# Patient Record
Sex: Female | Born: 1990
Health system: Southern US, Community
[De-identification: ages and names within clinical notes are randomized; demographics above are authoritative.]

## PROBLEM LIST (undated history)

## (undated) ENCOUNTER — Inpatient Hospital Stay (HOSPITAL_COMMUNITY): Payer: Self-pay

## (undated) ENCOUNTER — Emergency Department (HOSPITAL_COMMUNITY): Payer: BC Managed Care – PPO

## (undated) DIAGNOSIS — A64 Unspecified sexually transmitted disease: Secondary | ICD-10-CM

## (undated) DIAGNOSIS — I10 Essential (primary) hypertension: Secondary | ICD-10-CM

## (undated) HISTORY — DX: Unspecified sexually transmitted disease: A64

## (undated) HISTORY — DX: Essential (primary) hypertension: I10

## (undated) HISTORY — PX: INDUCED ABORTION: SHX677

## (undated) HISTORY — PX: LAPAROSCOPIC TUBAL LIGATION: SUR803

---

## 2011-02-16 ENCOUNTER — Inpatient Hospital Stay (HOSPITAL_COMMUNITY)
Admission: AD | Admit: 2011-02-16 | Discharge: 2011-02-21 | DRG: 775 | Disposition: A | Discharge status: Home / Self Care | Payer: Medicaid Other | Source: Ambulatory Visit | Attending: Obstetrics and Gynecology | Admitting: Obstetrics and Gynecology

## 2011-02-16 ENCOUNTER — Inpatient Hospital Stay (HOSPITAL_COMMUNITY): Payer: Medicaid Other

## 2011-02-16 DIAGNOSIS — B9689 Other specified bacterial agents as the cause of diseases classified elsewhere: Secondary | ICD-10-CM | POA: Diagnosis present

## 2011-02-16 DIAGNOSIS — O9903 Anemia complicating the puerperium: Secondary | ICD-10-CM | POA: Diagnosis not present

## 2011-02-16 DIAGNOSIS — A499 Bacterial infection, unspecified: Secondary | ICD-10-CM | POA: Diagnosis present

## 2011-02-16 DIAGNOSIS — N760 Acute vaginitis: Secondary | ICD-10-CM | POA: Diagnosis present

## 2011-02-16 DIAGNOSIS — D649 Anemia, unspecified: Secondary | ICD-10-CM | POA: Diagnosis not present

## 2011-02-16 DIAGNOSIS — Z22330 Carrier of Group B streptococcus: Secondary | ICD-10-CM

## 2011-02-16 DIAGNOSIS — O99892 Other specified diseases and conditions complicating childbirth: Principal | ICD-10-CM | POA: Diagnosis present

## 2011-02-16 DIAGNOSIS — O2390 Unspecified genitourinary tract infection in pregnancy, unspecified trimester: Secondary | ICD-10-CM | POA: Diagnosis present

## 2011-02-16 LAB — CBC
MCH: 27.5 pg (ref 26.0–34.0)
MCHC: 32.1 g/dL (ref 30.0–36.0)
MCV: 85.7 fL (ref 78.0–100.0)
Platelets: 158 10*3/uL (ref 150–400)
RDW: 13.4 % (ref 11.5–15.5)
WBC: 12.7 10*3/uL — ABNORMAL HIGH (ref 4.0–10.5)

## 2011-02-16 LAB — URINALYSIS, ROUTINE W REFLEX MICROSCOPIC
Bilirubin Urine: NEGATIVE
Ketones, ur: 15 mg/dL — AB
Nitrite: NEGATIVE
Protein, ur: NEGATIVE mg/dL
Urobilinogen, UA: 0.2 mg/dL (ref 0.0–1.0)

## 2011-02-16 LAB — COMPREHENSIVE METABOLIC PANEL
AST: 25 U/L (ref 0–37)
Alkaline Phosphatase: 58 U/L (ref 39–117)
BUN: 6 mg/dL (ref 6–23)
CO2: 22 mEq/L (ref 19–32)
Chloride: 106 mEq/L (ref 96–112)
Creatinine, Ser: 0.71 mg/dL (ref 0.4–1.2)
GFR calc non Af Amer: >60 mL/min (ref >60)
Potassium: 3.6 mEq/L (ref 3.5–5.1)
Total Bilirubin: 0.3 mg/dL (ref 0.3–1.2)

## 2011-02-16 LAB — WET PREP, GENITAL
Trich, Wet Prep: NONE SEEN
Yeast Wet Prep HPF POC: NONE SEEN

## 2011-02-16 LAB — DIFFERENTIAL
Eosinophils Relative: 0 % (ref 0–5)
Lymphocytes Relative: 16 % (ref 12–46)
Lymphs Abs: 2 10*3/uL (ref 0.7–4.0)
Monocytes Absolute: 1.1 10*3/uL — ABNORMAL HIGH (ref 0.1–1.0)

## 2011-02-17 LAB — MAGNESIUM: Magnesium: 5.2 mg/dL — ABNORMAL HIGH (ref 1.5–2.5)

## 2011-02-17 LAB — DIFFERENTIAL
Basophils Absolute: 0 10*3/uL (ref 0.0–0.1)
Eosinophils Relative: 0 % (ref 0–5)
Lymphocytes Relative: 6 % — ABNORMAL LOW (ref 12–46)
Lymphs Abs: 0.9 10*3/uL (ref 0.7–4.0)
Neutro Abs: 12.5 10*3/uL — ABNORMAL HIGH (ref 1.7–7.7)

## 2011-02-17 LAB — CBC
HCT: 31.4 % — ABNORMAL LOW (ref 36.0–46.0)
Hemoglobin: 10.1 g/dL — ABNORMAL LOW (ref 12.0–15.0)
MCV: 85.3 fL (ref 78.0–100.0)
RBC: 3.68 MIL/uL — ABNORMAL LOW (ref 3.87–5.11)
WBC: 13.9 10*3/uL — ABNORMAL HIGH (ref 4.0–10.5)

## 2011-02-17 LAB — COMPREHENSIVE METABOLIC PANEL
ALT: 38 U/L — ABNORMAL HIGH (ref 0–35)
Calcium: 7.4 mg/dL — ABNORMAL LOW (ref 8.4–10.5)
Creatinine, Ser: 0.62 mg/dL (ref 0.4–1.2)
GFR calc Af Amer: >60 mL/min (ref >60)
GFR calc non Af Amer: >60 mL/min (ref >60)
Glucose, Bld: 105 mg/dL — ABNORMAL HIGH (ref 70–99)
Sodium: 134 mEq/L — ABNORMAL LOW (ref 135–145)
Total Protein: 6.6 g/dL (ref 6.0–8.3)

## 2011-02-17 LAB — GC/CHLAMYDIA PROBE AMP, GENITAL: Chlamydia, DNA Probe: NEGATIVE

## 2011-02-18 LAB — STREP B DNA PROBE: Strep Group B Ag: POSITIVE

## 2011-02-18 LAB — MAGNESIUM: Magnesium: 5.5 mg/dL — ABNORMAL HIGH (ref 1.5–2.5)

## 2011-02-19 ENCOUNTER — Other Ambulatory Visit: Payer: Self-pay

## 2011-02-19 LAB — CBC
MCV: 86.8 fL (ref 78.0–100.0)
Platelets: 188 10*3/uL (ref 150–400)
RBC: 3.87 MIL/uL (ref 3.87–5.11)
RDW: 13.8 % (ref 11.5–15.5)
WBC: 16.2 10*3/uL — ABNORMAL HIGH (ref 4.0–10.5)

## 2011-02-19 LAB — HEPATIC FUNCTION PANEL
AST: 17 U/L (ref 0–37)
Bilirubin, Direct: 0.1 mg/dL (ref 0.0–0.3)
Indirect Bilirubin: 0 mg/dL — ABNORMAL LOW (ref 0.3–0.9)
Total Bilirubin: 0.1 mg/dL — ABNORMAL LOW (ref 0.3–1.2)

## 2011-02-19 LAB — RPR: RPR Ser Ql: NONREACTIVE

## 2011-02-20 LAB — CBC
Hemoglobin: 10.2 g/dL — ABNORMAL LOW (ref 12.0–15.0)
MCH: 27.7 pg (ref 26.0–34.0)
MCHC: 32.1 g/dL (ref 30.0–36.0)
Platelets: 184 10*3/uL (ref 150–400)
RBC: 3.68 MIL/uL — ABNORMAL LOW (ref 3.87–5.11)

## 2012-04-16 ENCOUNTER — Encounter: Payer: Self-pay | Admitting: Obstetrics and Gynecology

## 2012-04-16 ENCOUNTER — Ambulatory Visit (INDEPENDENT_AMBULATORY_CARE_PROVIDER_SITE_OTHER): Payer: Self-pay | Admitting: Obstetrics and Gynecology

## 2012-04-16 VITALS — BP 114/62 | Ht 64.0 in | Wt 232.0 lb

## 2012-04-16 DIAGNOSIS — L732 Hidradenitis suppurativa: Secondary | ICD-10-CM

## 2012-04-16 DIAGNOSIS — Z124 Encounter for screening for malignant neoplasm of cervix: Secondary | ICD-10-CM

## 2012-04-16 NOTE — Progress Notes (Signed)
Last Pap: 2012 WNL: Yes Regular Periods:no Contraception: Implanon 5/12   Monthly Breast exam:yes Tetanus<27yrs:yes Nl.Bladder Function:yes Daily BMs:yes Healthy Diet:yes Calcium:yes Mammogram:no Exercise:yes Seatbelt: yes Abuse at home: no Stressful work:yes Sigmoid-colonoscopy: n/a Bone Density: No Pt sates she has boil about twice a week undet axilla and on vulva.  She does use a razor to shave.  She has had no fever or chills. This has been going on for about one year Physical Examination: General appearance - alert, well appearing, and in no distress Mental status - normal mood, behavior, speech, dress, motor activity, and thought processes Neck - supple, no significant adenopathy, thyroid exam: thyroid is normal in size without nodules or tenderness Chest - clear to auscultation, no wheezes, rales or rhonchi, symmetric air entry Heart - normal rate and regular rhythm Abdomen - soft, nontender, nondistended, no masses or organomegaly Breasts - breasts appear normal, no suspicious masses, no skin or nipple changes or axillary nodes Pelvic - normal external genitalia, vulva, vagina, cervix, uterus and adnexa Rectal - normal rectal, no masses, rectal exam not indicated Back exam - full range of motion, no tenderness, palpable spasm or pain on motion Neurological - alert, oriented, normal speech, no focal findings or movement disorder noted Musculoskeletal - no joint tenderness, deformity or swelling Extremities - no edema, redness or tenderness in the calves or thighs Skin - normal coloration and turgor, no rashes, no suspicious skin lesions noted Axilla.  No abscesses noted Routine exam Hydadenitis  Pap sent no Mammogram due no *Nexplanon RT in one year.  Pt declined STD testing and derm evaluation.  Recommend warm compresses and stop shaving.

## 2012-10-18 ENCOUNTER — Encounter: Payer: Self-pay | Admitting: Obstetrics and Gynecology

## 2012-11-05 ENCOUNTER — Encounter: Payer: Self-pay | Admitting: Obstetrics and Gynecology

## 2012-11-05 ENCOUNTER — Ambulatory Visit (INDEPENDENT_AMBULATORY_CARE_PROVIDER_SITE_OTHER): Payer: Self-pay | Admitting: Obstetrics and Gynecology

## 2012-11-05 VITALS — BP 116/80 | Wt 235.0 lb

## 2012-11-05 DIAGNOSIS — Z3046 Encounter for surveillance of implantable subdermal contraceptive: Secondary | ICD-10-CM

## 2012-11-05 DIAGNOSIS — Z309 Encounter for contraceptive management, unspecified: Secondary | ICD-10-CM

## 2012-11-05 DIAGNOSIS — Z3009 Encounter for other general counseling and advice on contraception: Secondary | ICD-10-CM

## 2012-11-05 MED ORDER — NORETHIN-ETH ESTRAD-FE BIPHAS 1 MG-10 MCG / 10 MCG PO TABS: 1.0000 | ORAL_TABLET | Freq: Every day | ORAL | Status: DC

## 2012-11-05 NOTE — Progress Notes (Signed)
Implanon removal  left arm prepped and draped  1cc lidocaine used for ansthesia Small incision made with the scalpel nexplanon removed without difficulty with hemostat Incision closed with ster strips and pressure applied with 4x4 Pt tolerated the procedure well  Pt given info on sll BC She desires OCP No contraindictions Pt given lo/lo estrin sample with verbal and written instrucitons RT JUne for AEX

## 2012-11-05 NOTE — Patient Instructions (Signed)
Oral Contraception Use Oral contraceptives (OCs) are medicines taken to prevent pregnancy. OCs work by preventing the ovaries from releasing eggs. The hormones in OCs also cause the cervical mucus to thicken, preventing the sperm from entering the uterus. The hormones also cause the uterine lining to become thin, not allowing a fertilized egg to attach to the inside of the uterus. OCs are highly effective when taken exactly as prescribed. However, OCs do not prevent sexually transmitted diseases (STDs). Safe sex practices, such as using condoms along with an OC, can help prevent STDs.  Before taking OCs, you may have a physical exam and Pap test. Your caregiver may also order blood tests if necessary. Your caregiver will make sure you are a good candidate for oral contraception. Discuss with your caregiver the possible side effects of the OC you may be prescribed. When starting an OC, it can take 2 to 3 months for the body to adjust to the changes in hormone levels in your body.  HOW TO TAKE ORAL CONTRACEPTIVES Your caregiver may advise you on how to start taking the first cycle of OCs. Otherwise, you can:  Start on day 1 of your menstrual period. You will not need any backup contraceptive protection with this start time.  Start on the first Sunday after your menstrual period or the day you get your prescription. In these cases, you will need to use backup contraceptive protection for the first 7-day cycle. After you have started taking OCs:  If you forget to take 1 pill, take it as soon as you remember. Take the next pill at the regular time.  If you miss 2 or more pills, use backup birth control until your next menstrual period starts.  If you use a 28-day pack that contains inactive pills and you miss 1 of the last 7 pills (pills with no hormones), it will not matter. Throw away the rest of the non-hormone pills and start a new pill pack. No matter which day you start the OC, you will always start  a new pack on that same day of the week. Have an extra pack of OCs and a backup contraceptive method available in case you miss some pills or lose your OC pack. HOME CARE INSTRUCTIONS   Do not smoke.  Always use a condom to protect against STDs. OCs do not protect against STDs.  Use a calendar to mark your menstrual period days.  Read the information and directions that come with your OC. Talk to your caregiver if you have questions. SEEK MEDICAL CARE IF:   You develop nausea and vomiting.  You have abnormal vaginal discharge or bleeding.  You develop a rash.  You miss your menstrual period.  You are losing your hair.  You need treatment for mood swings or depression.  You get dizzy when taking the OC.  You develop acne from taking the OC.  You become pregnant. SEEK IMMEDIATE MEDICAL CARE IF:   You develop chest pain.  You develop shortness of breath.  You have an uncontrolled or severe headache.  You develop numbness or slurred speech.  You develop visual problems.  You develop pain, redness, and swelling in the legs. Document Released: 11/03/2011 Document Revised: 02/06/2012 Document Reviewed: 11/03/2011 [employer] Patient Information 2013 [employer].  

## 2013-08-05 ENCOUNTER — Emergency Department (INDEPENDENT_AMBULATORY_CARE_PROVIDER_SITE_OTHER)
Admission: EM | Admit: 2013-08-05 | Discharge: 2013-08-05 | Disposition: A | Discharge status: Home / Self Care | Payer: Self-pay | Source: Home / Self Care | Attending: Family Medicine | Admitting: Family Medicine

## 2013-08-05 ENCOUNTER — Encounter (HOSPITAL_COMMUNITY): Payer: Self-pay | Admitting: Emergency Medicine

## 2013-08-05 DIAGNOSIS — L0291 Cutaneous abscess, unspecified: Secondary | ICD-10-CM

## 2013-08-05 NOTE — ED Notes (Signed)
Question from lab

## 2013-08-05 NOTE — ED Notes (Signed)
C/o abscess on buttock x 5 days. Denies drainage and fever. States gradually getting worse and tender to touch.  Pt has used advil for pain relief.

## 2013-08-05 NOTE — ED Provider Notes (Signed)
Theresa Horne is a 22 y.o. female who presents to Urgent Care today for left buttock abscess present for 5 days. The abscess is worsening and is quite painful. She denies any fevers or chills nausea vomiting diarrhea. These are consistent with prior abscesses. She's never had one of the buttocks before. She feels well otherwise. She has not tried any medications.   Past Medical History  Diagnosis Date  . Hypertension    History  Substance Use Topics  . Smoking status: Never Smoker   . Smokeless tobacco: Never Used  . Alcohol Use: No   ROS as above Medications reviewed. No current facility-administered medications for this encounter.   Current Outpatient Prescriptions  Medication Sig Dispense Refill  . Norethindrone-Ethinyl Estradiol-Fe Biphas (LO LOESTRIN FE) 1 MG-10 MCG / 10 MCG tablet Take 1 tablet by mouth daily.  1 Package  6    Exam:  BP 141/86  Pulse 78  Temp(Src) 98.1 F (36.7 C) (Oral)  Resp 18  SpO2 100%  LMP 08/05/2013 Gen: Well NAD SKIN: 4 cm area of induration with small area of fluctuance on the left buttocks. No pus is expressed. Tender to palpation.   Abscess incision and drainage:  Consent obtained and timeout performed. Skin overlying the area of fluctuance cleaned with alcohol and 3 mL of 2% lidocaine with epinephrine were used to achieve anesthesia. The skin was again re\re cleaned with alcohol and a sharp scalpel was used to cut down to the level of the abscess. A large amount of pus was expressed and was cultured. Further pus was expressed and a wooden Q-tip was used to breakup loculations.  The wound was packed with 5 inches of quarter inch packing material.  A dressing was applied.   Assessment and Plan: 22 y.o. female with abscess incision and drainage.  Culture pending. Remove packing material in 3 days.  Change dressing daily.  Come back as needed.  Discussed warning signs or symptoms. Please see discharge instructions. Patient expresses  understanding.       Rodolph Bong, MD 08/05/13 1340

## 2013-08-08 ENCOUNTER — Telehealth (HOSPITAL_COMMUNITY): Payer: Self-pay | Admitting: Family Medicine

## 2013-08-08 LAB — WOUND CULTURE

## 2013-08-08 MED ORDER — CEPHALEXIN 500 MG PO CAPS: 500.0000 mg | ORAL_CAPSULE | Freq: Four times a day (QID) | ORAL | Status: DC

## 2013-08-08 NOTE — ED Notes (Signed)
Called patient with wound culture results.  She continues to have some pain.  Called in Keflex.  Patient is growing Group B Strep.   Rodolph Bong, MD 08/08/13 0800

## 2013-10-12 ENCOUNTER — Encounter (HOSPITAL_COMMUNITY): Payer: Self-pay | Admitting: Emergency Medicine

## 2013-10-12 ENCOUNTER — Other Ambulatory Visit (HOSPITAL_COMMUNITY)
Admission: RE | Admit: 2013-10-12 | Discharge: 2013-10-12 | Disposition: A | Discharge status: Home / Self Care | Payer: BC Managed Care – PPO | Source: Ambulatory Visit | Attending: Emergency Medicine | Admitting: Emergency Medicine

## 2013-10-12 ENCOUNTER — Emergency Department (HOSPITAL_COMMUNITY)
Admission: EM | Admit: 2013-10-12 | Discharge: 2013-10-12 | Disposition: A | Discharge status: Home / Self Care | Payer: Self-pay | Source: Home / Self Care | Attending: Emergency Medicine | Admitting: Emergency Medicine

## 2013-10-12 DIAGNOSIS — N760 Acute vaginitis: Secondary | ICD-10-CM | POA: Insufficient documentation

## 2013-10-12 DIAGNOSIS — Z113 Encounter for screening for infections with a predominantly sexual mode of transmission: Secondary | ICD-10-CM | POA: Insufficient documentation

## 2013-10-12 MED ORDER — FLUCONAZOLE 100 MG PO TABS: ORAL_TABLET | ORAL | Status: DC

## 2013-10-12 NOTE — ED Notes (Signed)
C/o vaginal itching and irritation since Wednesday

## 2013-10-12 NOTE — ED Provider Notes (Signed)
Medical screening examination/treatment/procedure(s) were performed by a resident physician and as supervising physician I was immediately available for consultation/collaboration.  Leslee Home, M.D.  Reuben Likes, MD 10/12/13 [phone]

## 2013-10-12 NOTE — ED Provider Notes (Signed)
CSN: [id number]     Arrival date & time 10/12/13  1610 History   First MD Initiated Contact with Patient 10/12/13 1721     Chief Complaint  Patient presents with  . Vaginal Itching   (Consider location/radiation/quality/duration/timing/severity/associated sxs/prior Treatment) HPI Pt is a 22 yo F presenting with vaginal itching, white discharge without odor and external burning. Tried Monistat, which did not give her any relief. States she has a new partner, does not think she could have a sexually transmitted disease. LMP 2.5 weeks ago. No external bumps/blisters, no abnormal bleeding. No dysuria, no constipation. She states she had similar discharge about 5 months ago that resolved with OTC monistat.   Past Medical History  Diagnosis Date  . Hypertension    History reviewed. No pertinent past surgical history. Family History  Problem Relation Age of Onset  . Hypertension Father   . Diabetes Paternal Uncle   . Renal Disease Maternal Grandmother    History  Substance Use Topics  . Smoking status: Never Smoker   . Smokeless tobacco: Never Used  . Alcohol Use: No   OB History   Grav Para Term Preterm Abortions TAB SAB Ect Mult Living   2 1 1  1     1      Review of Systems  Constitutional: Negative for fever and chills.  HENT: Negative for congestion.   Eyes: Negative for visual disturbance.  Respiratory: Negative for cough and shortness of breath.   Cardiovascular: Negative for chest pain and leg swelling.  Gastrointestinal: Negative for abdominal pain.  Genitourinary: Positive for vaginal discharge. Negative for dysuria, difficulty urinating, genital sores, menstrual problem, pelvic pain and dyspareunia.  Musculoskeletal: Negative for arthralgias and myalgias.  Skin: Negative for rash.  Neurological: Negative for headaches.    Allergies  Review of patient's allergies indicates no known allergies.  Home Medications   Current Outpatient Rx  Name  Route  Sig  Dispense   Refill  . Norethindrone-Ethinyl Estradiol-Fe Biphas (LO LOESTRIN FE) 1 MG-10 MCG / 10 MCG tablet   Oral   Take 1 tablet by mouth daily.   1 Package   6   . cephALEXin (KEFLEX) 500 MG capsule   Oral   Take 1 capsule (500 mg total) by mouth 4 (four) times daily.   40 capsule   0   . fluconazole (DIFLUCAN) 100 MG tablet      Take one tab today. If your symptoms do not resolve, take second pill in 72 hours.   2 tablet   0    BP 140/79  Pulse 84  Temp(Src) 98 F (36.7 C) (Oral)  Resp 16  SpO2 100%  LMP 09/26/2013 Physical Exam  Constitutional: She is oriented to person, place, and time. She appears well-developed and well-nourished. No distress.  HENT:  Head: Normocephalic and atraumatic.  Neck: Normal range of motion. Neck supple.  Cardiovascular: Normal rate, regular rhythm and normal heart sounds.   No murmur heard. Pulmonary/Chest: Effort normal and breath sounds normal. She has no wheezes.  Abdominal: Soft. She exhibits no distension. There is no tenderness. There is no rebound and no guarding.  Genitourinary: Uterus normal. Vaginal discharge (White discharge noted around cervix, no odor appreciated.) found.  No CMT. No Adnexal tenderness or fullness.  Musculoskeletal: Normal range of motion. She exhibits no edema and no tenderness.  Neurological: She is alert and oriented to person, place, and time.  Skin: Skin is warm and dry.  Psychiatric: She has a normal mood  and affect.    ED Course  Procedures (including critical care time) Labs Review Labs Reviewed  CERVICOVAGINAL ANCILLARY ONLY   Imaging Review No results found.   MDM   1. Vaginitis    Discharge most consistent with yeast infection. Will treat with Diflucan once, with repeat treatment in 72 hours if not improved. Will send affirm and GC/Ch and contact if anything results other than yeast. Patient agrees with plan.    Hilarie Fredrickson, MD 10/12/13 [phone]

## 2013-10-14 LAB — POCT PREGNANCY, URINE: Preg Test, Ur: NEGATIVE

## 2014-04-10 ENCOUNTER — Other Ambulatory Visit (HOSPITAL_COMMUNITY)
Admission: RE | Admit: 2014-04-10 | Discharge: 2014-04-10 | Disposition: A | Discharge status: Home / Self Care | Payer: BC Managed Care – PPO | Source: Ambulatory Visit | Attending: Family Medicine | Admitting: Family Medicine

## 2014-04-10 ENCOUNTER — Encounter (HOSPITAL_COMMUNITY): Payer: Self-pay | Admitting: Emergency Medicine

## 2014-04-10 ENCOUNTER — Emergency Department (HOSPITAL_COMMUNITY)
Admission: EM | Admit: 2014-04-10 | Discharge: 2014-04-10 | Disposition: A | Discharge status: Home / Self Care | Payer: BC Managed Care – PPO | Source: Home / Self Care | Attending: Family Medicine | Admitting: Family Medicine

## 2014-04-10 DIAGNOSIS — N760 Acute vaginitis: Secondary | ICD-10-CM | POA: Insufficient documentation

## 2014-04-10 DIAGNOSIS — IMO0001 Reserved for inherently not codable concepts without codable children: Secondary | ICD-10-CM

## 2014-04-10 DIAGNOSIS — Z113 Encounter for screening for infections with a predominantly sexual mode of transmission: Secondary | ICD-10-CM | POA: Insufficient documentation

## 2014-04-10 DIAGNOSIS — R030 Elevated blood-pressure reading, without diagnosis of hypertension: Secondary | ICD-10-CM

## 2014-04-10 LAB — POCT URINALYSIS DIP (DEVICE)
Bilirubin Urine: NEGATIVE
Glucose, UA: NEGATIVE mg/dL
Ketones, ur: NEGATIVE mg/dL
NITRITE: NEGATIVE
Protein, ur: 100 mg/dL — AB
Specific Gravity, Urine: >=1.03 (ref 1.005–1.030)
UROBILINOGEN UA: 0.2 mg/dL (ref 0.0–1.0)
pH: 6.5 (ref 5.0–8.0)

## 2014-04-10 LAB — POCT PREGNANCY, URINE: PREG TEST UR: NEGATIVE

## 2014-04-10 MED ORDER — METRONIDAZOLE 500 MG PO TABS: 500.0000 mg | ORAL_TABLET | Freq: Two times a day (BID) | ORAL | Status: DC

## 2014-04-10 NOTE — Discharge Instructions (Signed)
Your urine studies were normal and your pregnancy test was negative. You blood pressure was quite elevated and this need to be addressed by the primary care provider of your choice as soon as possible.  Bacterial Vaginosis Bacterial vaginosis is a vaginal infection that occurs when the normal balance of bacteria in the vagina is disrupted. It results from an overgrowth of certain bacteria. This is the most common vaginal infection in women of childbearing age. Treatment is important to prevent complications, especially in pregnant women, as it can cause a premature delivery. CAUSES  Bacterial vaginosis is caused by an increase in harmful bacteria that are normally present in smaller amounts in the vagina. Several different kinds of bacteria can cause bacterial vaginosis. However, the reason that the condition develops is not fully understood. RISK FACTORS Certain activities or behaviors can put you at an increased risk of developing bacterial vaginosis, including:  Having a new sex partner or multiple sex partners.  Douching.  Using an intrauterine device (IUD) for contraception. Women do not get bacterial vaginosis from toilet seats, bedding, swimming pools, or contact with objects around them. SIGNS AND SYMPTOMS  Some women with bacterial vaginosis have no signs or symptoms. Common symptoms include:  Grey vaginal discharge.  A fishlike odor with discharge, especially after sexual intercourse.  Itching or burning of the vagina and vulva.  Burning or pain with urination. DIAGNOSIS  Your health care provider will take a medical history and examine the vagina for signs of bacterial vaginosis. A sample of vaginal fluid may be taken. Your health care provider will look at this sample under a microscope to check for bacteria and abnormal cells. A vaginal pH test may also be done.  TREATMENT  Bacterial vaginosis may be treated with antibiotic medicines. These may be given in the form of a pill  or a vaginal cream. A second round of antibiotics may be prescribed if the condition comes back after treatment.  HOME CARE INSTRUCTIONS   Only take over-the-counter or prescription medicines as directed by your health care provider.  If antibiotic medicine was prescribed, take it as directed. Make sure you finish it even if you start to feel better.  Do not have sex until treatment is completed.  Tell all sexual partners that you have a vaginal infection. They should see their health care provider and be treated if they have problems, such as a mild rash or itching.  Practice safe sex by using condoms and only having one sex partner. SEEK MEDICAL CARE IF:   Your symptoms are not improving after 3 days of treatment.  You have increased discharge or pain.  You have a fever. MAKE SURE YOU:   Understand these instructions.  Will watch your condition.  Will get help right away if you are not doing well or get worse. FOR MORE INFORMATION  Centers for Disease Control and Prevention, Division of STD Prevention: SolutionApps.co.zawww.cdc.gov/std American Sexual Health Association (ASHA): www.ashastd.org  Document Released: 11/14/2005 Document Revised: 09/04/2013 Document Reviewed: 06/26/2013 [facility] Patient Information 2014 [place], [place].  Vaginitis Vaginitis is an inflammation of the vagina. It is most often caused by a change in the normal balance of the bacteria and yeast that live in the vagina. This change in balance causes an overgrowth of certain bacteria or yeast, which causes the inflammation. There are different types of vaginitis, but the most common types are:  Bacterial vaginosis.  Yeast infection (candidiasis).  Trichomoniasis vaginitis. This is a sexually transmitted infection (STI).  Viral  vaginitis.  Atropic vaginitis.  Allergic vaginitis. CAUSES  The cause depends on the type of vaginitis. Vaginitis can be caused by:  Bacteria (bacterial vaginosis).  Yeast (yeast  infection).  A parasite (trichomoniasis vaginitis)  A virus (viral vaginitis).  Low hormone levels (atrophic vaginitis). Low hormone levels can occur during pregnancy, breastfeeding, or after menopause.  Irritants, such as bubble baths, scented tampons, and feminine sprays (allergic vaginitis). Other factors can change the normal balance of the yeast and bacteria that live in the vagina. These include:  Antibiotic medicines.  Poor hygiene.  Diaphragms, vaginal sponges, spermicides, birth control pills, and intrauterine devices (IUD).  Sexual intercourse.  Infection.  Uncontrolled diabetes.  A weakened immune system. SYMPTOMS  Symptoms can vary depending on the cause of the vaginitis. Common symptoms include:  Abnormal vaginal discharge.  The discharge is white, gray, or yellow with bacterial vaginosis.  The discharge is thick, white, and cheesy with a yeast infection.  The discharge is frothy and yellow or greenish with trichomoniasis.  A bad vaginal odor.  The odor is fishy with bacterial vaginosis.  Vaginal itching, pain, or swelling.  Painful intercourse.  Pain or burning when urinating. Sometimes, there are no symptoms. TREATMENT  Treatment will vary depending on the type of infection.   Bacterial vaginosis and trichomoniasis are often treated with antibiotic creams or pills.  Yeast infections are often treated with antifungal medicines, such as vaginal creams or suppositories.  Viral vaginitis has no cure, but symptoms can be treated with medicines that relieve discomfort. Your sexual partner should be treated as well.  Atrophic vaginitis may be treated with an estrogen cream, pill, suppository, or vaginal ring. If vaginal dryness occurs, lubricants and moisturizing creams may help. You may be told to avoid scented soaps, sprays, or douches.  Allergic vaginitis treatment involves quitting the use of the product that is causing the problem. Vaginal creams  can be used to treat the symptoms. HOME CARE INSTRUCTIONS   Take all medicines as directed by your caregiver.  Keep your genital area clean and dry. Avoid soap and only rinse the area with water.  Avoid douching. It can remove the healthy bacteria in the vagina.  Do not use tampons or have sexual intercourse until your vaginitis has been treated. Use sanitary pads while you have vaginitis.  Wipe from front to back. This avoids the spread of bacteria from the rectum to the vagina.  Let air reach your genital area.  Wear cotton underwear to decrease moisture buildup.  Avoid wearing underwear while you sleep until your vaginitis is gone.  Avoid tight pants and underwear or nylons without a cotton panel.  Take off wet clothing (especially bathing suits) as soon as possible.  Use mild, non-scented products. Avoid using irritants, such as:  Scented feminine sprays.  Fabric softeners.  Scented detergents.  Scented tampons.  Scented soaps or bubble baths.  Practice safe sex and use condoms. Condoms may prevent the spread of trichomoniasis and viral vaginitis. SEEK MEDICAL CARE IF:   You have abdominal pain.  You have a fever or persistent symptoms for more than 2 3 days.  You have a fever and your symptoms suddenly get worse. Document Released: 09/11/2007 Document Revised: 08/08/2012 Document Reviewed: 04/26/2012 [facility] Patient Information 2014 [place], [place].  Hypertension As your heart beats, it forces blood through your arteries. This force is your blood pressure. If the pressure is too high, it is called hypertension (HTN) or high blood pressure. HTN is dangerous because you  may have it and not know it. High blood pressure may mean that your heart has to work harder to pump blood. Your arteries may be narrow or stiff. The extra work puts you at risk for heart disease, stroke, and other problems.  Blood pressure consists of two numbers, a higher number over a lower,  110/72, for example. It is stated as "110 over 72." The ideal is below 120 for the top number (systolic) and under 80 for the bottom (diastolic). Write down your blood pressure today. You should pay close attention to your blood pressure if you have certain conditions such as:  Heart failure.  Prior heart attack.  Diabetes  Chronic kidney disease.  Prior stroke.  Multiple risk factors for heart disease. To see if you have HTN, your blood pressure should be measured while you are seated with your arm held at the level of the heart. It should be measured at least twice. A one-time elevated blood pressure reading (especially in the Emergency Department) does not mean that you need treatment. There may be conditions in which the blood pressure is different between your right and left arms. It is important to see your caregiver soon for a recheck. Most people have essential hypertension which means that there is not a specific cause. This type of high blood pressure may be lowered by changing lifestyle factors such as:  Stress.  Smoking.  Lack of exercise.  Excessive weight.  Drug/tobacco/alcohol use.  Eating less salt. Most people do not have symptoms from high blood pressure until it has caused damage to the body. Effective treatment can often prevent, delay or reduce that damage. TREATMENT  When a cause has been identified, treatment for high blood pressure is directed at the cause. There are a large number of medications to treat HTN. These fall into several categories, and your caregiver will help you select the medicines that are best for you. Medications may have side effects. You should review side effects with your caregiver. If your blood pressure stays high after you have made lifestyle changes or started on medicines,   Your medication(s) may need to be changed.  Other problems may need to be addressed.  Be certain you understand your prescriptions, and know how and when  to take your medicine.  Be sure to follow up with your caregiver within the time frame advised (usually within two weeks) to have your blood pressure rechecked and to review your medications.  If you are taking more than one medicine to lower your blood pressure, make sure you know how and at what times they should be taken. Taking two medicines at the same time can result in blood pressure that is too low. SEEK IMMEDIATE MEDICAL CARE IF:  You develop a severe headache, blurred or changing vision, or confusion.  You have unusual weakness or numbness, or a faint feeling.  You have severe chest or abdominal pain, vomiting, or breathing problems. MAKE SURE YOU:   Understand these instructions.  Will watch your condition.  Will get help right away if you are not doing well or get worse. Document Released: 11/14/2005 Document Revised: 02/06/2012 Document Reviewed: 07/04/2008 [facility] MexicoExitCare Patient Information 2014 [place]ExitCare, [place]LL[place].

## 2014-04-10 NOTE — ED Provider Notes (Signed)
Medical screening examination/treatment/procedure(s) were performed by resident physician or non-physician practitioner and as supervising physician I was immediately available for consultation/collaboration.   Dnya Hickle DOUGLAS MD.   Chaye Misch D Elmon Shader, MD 04/10/14 1812 

## 2014-04-10 NOTE — ED Notes (Signed)
pT  REPORTS  SYMPTOMS  OF  WHITE  VAGINAL  DISCHARGE  WITH  SOME  ITCHING    /  IRRITATION      FOR  SEVERAL  DAYS

## 2014-04-10 NOTE — ED Provider Notes (Signed)
CSN: [id number]     Arrival date & time 04/10/14  1601 History   First MD Initiated Contact with Patient 04/10/14 1640     Chief Complaint  Patient presents with  . Vaginal Discharge   (Consider location/radiation/quality/duration/timing/severity/associated sxs/prior Treatment) HPI Comments: No urinary sx, no vaginal bleeding.  [facility]: [facility] PCP: none LNMP: 3rd week in April 2015.   Patient is a 23 y.o. female presenting with vaginal itching. The history is provided by the patient.  Vaginal Itching This is a new problem. The current episode started 2 days ago. The problem occurs constantly. The problem has not changed since onset.Nothing aggravates the symptoms. Nothing relieves the symptoms. She has tried nothing for the symptoms.    Past Medical History  Diagnosis Date  . Hypertension    History reviewed. No pertinent past surgical history. Family History  Problem Relation Age of Onset  . Hypertension Father   . Diabetes Paternal Uncle   . Renal Disease Maternal Grandmother    History  Substance Use Topics  . Smoking status: Never Smoker   . Smokeless tobacco: Never Used  . Alcohol Use: No   OB History   Grav Para Term Preterm Abortions TAB SAB Ect Mult Living   2 1 1  1     1      Review of Systems  All other systems reviewed and are negative.   Allergies  Review of patient's allergies indicates no known allergies.  Home Medications   Prior to Admission medications   Medication Sig Start Date End Date Taking? Authorizing Provider  cephALEXin (KEFLEX) 500 MG capsule Take 1 capsule (500 mg total) by mouth 4 (four) times daily. 08/08/13   Rodolph BongEvan S Corey, MD  fluconazole (DIFLUCAN) 100 MG tablet Take one tab today. If your symptoms do not resolve, take second pill in 72 hours. 10/12/13   Amber Nydia BoutonM Hairford, MD  Norethindrone-Ethinyl Estradiol-Fe Biphas (LO LOESTRIN FE) 1 MG-10 MCG / 10 MCG tablet Take 1 tablet by mouth daily. 11/05/12   Naima A Dillard, MD    BP 152/102  Pulse 83  Temp(Src) 98.1 F (36.7 C) (Oral)  Resp 18  SpO2 100%  LMP 03/12/2014 Physical Exam  Nursing note and vitals reviewed. Constitutional: She is oriented to person, place, and time. She appears well-developed and well-nourished. No distress.  HENT:  Head: Normocephalic and atraumatic.  Eyes: Conjunctivae are normal. No scleral icterus.  Cardiovascular: Normal rate.   Pulmonary/Chest: Effort normal.  Abdominal: Soft. Bowel sounds are normal. She exhibits no distension and no mass. There is no tenderness.  Genitourinary: Uterus normal. Pelvic exam was performed with patient supine. There is no rash, tenderness, lesion or injury on the right labia. There is no rash, tenderness, lesion or injury on the left labia. Cervix exhibits discharge. Cervix exhibits no motion tenderness and no friability. Right adnexum displays no mass, no tenderness and no fullness. Left adnexum displays no mass, no tenderness and no fullness. No erythema or tenderness around the vagina. No foreign body around the vagina. No signs of injury around the vagina. Vaginal discharge found.  Scant amount of dark mucous in vaginal vault  Musculoskeletal: Normal range of motion.  Neurological: She is alert and oriented to person, place, and time.  Skin: Skin is warm and dry. No rash noted. No erythema.  Psychiatric: She has a normal mood and affect. Her behavior is normal.    ED Course  Procedures (including critical care time) Labs Review Labs Reviewed  POCT URINALYSIS DIP (DEVICE) - Abnormal; Notable for the following:    Hgb urine dipstick LARGE (*)    Protein, ur 100 (*)    Leukocytes, UA SMALL (*)    All other components within normal limits  POCT PREGNANCY, URINE  CERVICOVAGINAL ANCILLARY ONLY    Imaging Review No results found.   MDM   1. Vaginitis   2. Elevated blood pressure    urine studies were normal and pregnancy test was negative. blood pressure was quite elevated and this  need to be addressed by the primary care provider of your choice as soon as possible. Will treat for BV with metronidazole and additional treatement to be based upon lab results. GYN follow up if no improvement. PCP follow up for elevated BP   Jess BartersJennifer Lee Campo BonitoPresson, GeorgiaPA 04/10/14 1755

## 2014-05-13 ENCOUNTER — Emergency Department (INDEPENDENT_AMBULATORY_CARE_PROVIDER_SITE_OTHER): Payer: BC Managed Care – PPO

## 2014-05-13 ENCOUNTER — Encounter (HOSPITAL_COMMUNITY): Payer: Self-pay | Admitting: Emergency Medicine

## 2014-05-13 ENCOUNTER — Emergency Department (INDEPENDENT_AMBULATORY_CARE_PROVIDER_SITE_OTHER)
Admission: EM | Admit: 2014-05-13 | Discharge: 2014-05-13 | Disposition: A | Discharge status: Home / Self Care | Payer: BC Managed Care – PPO | Source: Home / Self Care | Attending: Emergency Medicine | Admitting: Emergency Medicine

## 2014-05-13 DIAGNOSIS — N912 Amenorrhea, unspecified: Secondary | ICD-10-CM

## 2014-05-13 DIAGNOSIS — N200 Calculus of kidney: Secondary | ICD-10-CM

## 2014-05-13 LAB — POCT URINALYSIS DIP (DEVICE)
Bilirubin Urine: NEGATIVE
Glucose, UA: NEGATIVE mg/dL
Hgb urine dipstick: NEGATIVE
Ketones, ur: NEGATIVE mg/dL
LEUKOCYTES UA: NEGATIVE
NITRITE: NEGATIVE
Protein, ur: NEGATIVE mg/dL
Specific Gravity, Urine: >=1.03 (ref 1.005–1.030)
Urobilinogen, UA: 0.2 mg/dL (ref 0.0–1.0)
pH: 6 (ref 5.0–8.0)

## 2014-05-13 LAB — POCT I-STAT, CHEM 8
BUN: 11 mg/dL (ref 6–23)
CREATININE: 1.1 mg/dL (ref 0.50–1.10)
Calcium, Ion: 1.3 mmol/L — ABNORMAL HIGH (ref 1.12–1.23)
Chloride: 101 mEq/L (ref 96–112)
Glucose, Bld: 85 mg/dL (ref 70–99)
HCT: 45 % (ref 36.0–46.0)
Hemoglobin: 15.3 g/dL — ABNORMAL HIGH (ref 12.0–15.0)
POTASSIUM: 4 meq/L (ref 3.7–5.3)
SODIUM: 144 meq/L (ref 137–147)
TCO2: 25 mmol/L (ref 0–100)

## 2014-05-13 LAB — CBC WITH DIFFERENTIAL/PLATELET
Basophils Absolute: 0 10*3/uL (ref 0.0–0.1)
Basophils Relative: 1 % (ref 0–1)
Eosinophils Absolute: 0.1 10*3/uL (ref 0.0–0.7)
Eosinophils Relative: 1 % (ref 0–5)
HCT: 42.8 % (ref 36.0–46.0)
Hemoglobin: 13.9 g/dL (ref 12.0–15.0)
LYMPHS ABS: 2 10*3/uL (ref 0.7–4.0)
LYMPHS PCT: 26 % (ref 12–46)
MCH: 28.7 pg (ref 26.0–34.0)
MCHC: 32.5 g/dL (ref 30.0–36.0)
MCV: 88.2 fL (ref 78.0–100.0)
Monocytes Absolute: 0.5 10*3/uL (ref 0.1–1.0)
Monocytes Relative: 6 % (ref 3–12)
NEUTROS PCT: 66 % (ref 43–77)
Neutro Abs: 5.3 10*3/uL (ref 1.7–7.7)
Platelets: 195 10*3/uL (ref 150–400)
RBC: 4.85 MIL/uL (ref 3.87–5.11)
RDW: 14.2 % (ref 11.5–15.5)
WBC: 7.9 10*3/uL (ref 4.0–10.5)

## 2014-05-13 LAB — POCT PREGNANCY, URINE: PREG TEST UR: NEGATIVE

## 2014-05-13 MED ORDER — OXYCODONE-ACETAMINOPHEN 5-325 MG PO TABS: ORAL_TABLET | ORAL | Status: DC

## 2014-05-13 MED ORDER — TAMSULOSIN HCL 0.4 MG PO CAPS: 0.4000 mg | ORAL_CAPSULE | Freq: Every day | ORAL | Status: DC

## 2014-05-13 MED ORDER — MEDROXYPROGESTERONE ACETATE 10 MG PO TABS: 10.0000 mg | ORAL_TABLET | Freq: Every day | ORAL | Status: DC

## 2014-05-13 NOTE — Discharge Instructions (Signed)
Kidney Stones Kidney stones (urolithiasis) are deposits that form inside your kidneys. The intense pain is caused by the stone moving through the urinary tract. When the stone moves, the ureter goes into spasm around the stone. The stone is usually passed in the urine.  CAUSES   A disorder that makes certain neck glands produce too much parathyroid hormone (primary hyperparathyroidism).  A buildup of uric acid crystals, similar to gout in your joints.  Narrowing (stricture) of the ureter.  A kidney obstruction present at birth (congenital obstruction).  Previous surgery on the kidney or ureters.  Numerous kidney infections. SYMPTOMS   Feeling sick to your stomach (nauseous).  Throwing up (vomiting).  Blood in the urine (hematuria).  Pain that usually spreads (radiates) to the groin.  Frequency or urgency of urination. DIAGNOSIS   Taking a history and physical exam.  Blood or urine tests.  CT scan.  Occasionally, an examination of the inside of the urinary bladder (cystoscopy) is performed. TREATMENT   Observation.  Increasing your fluid intake.  Extracorporeal shock wave lithotripsy This is a noninvasive procedure that uses shock waves to break up kidney stones.  Surgery may be needed if you have severe pain or persistent obstruction. There are various surgical procedures. Most of the procedures are performed with the use of small instruments. Only small incisions are needed to accommodate these instruments, so recovery time is minimized. The size, location, and chemical composition are all important variables that will determine the proper choice of action for you. Talk to your health care provider to better understand your situation so that you will minimize the risk of injury to yourself and your kidney.  HOME CARE INSTRUCTIONS   Drink enough water and fluids to keep your urine clear or pale yellow. This will help you to pass the stone or stone fragments.  Strain  all urine through the provided strainer. Keep all particulate matter and stones for your health care provider to see. The stone causing the pain may be as small as a grain of salt. It is very important to use the strainer each and every time you pass your urine. The collection of your stone will allow your health care provider to analyze it and verify that a stone has actually passed. The stone analysis will often identify what you can do to reduce the incidence of recurrences.  Only take over-the-counter or prescription medicines for pain, discomfort, or fever as directed by your health care provider.  Make a follow-up appointment with your health care provider as directed.  Get follow-up X-rays if required. The absence of pain does not always mean that the stone has passed. It may have only stopped moving. If the urine remains completely obstructed, it can cause loss of kidney function or even complete destruction of the kidney. It is your responsibility to make sure X-rays and follow-ups are completed. Ultrasounds of the kidney can show blockages and the status of the kidney. Ultrasounds are not associated with any radiation and can be performed easily in a matter of minutes. SEEK MEDICAL CARE IF:  You experience pain that is progressive and unresponsive to any pain medicine you have been prescribed. SEEK IMMEDIATE MEDICAL CARE IF:   Pain cannot be controlled with the prescribed medicine.  You have a fever or shaking chills.  The severity or intensity of pain increases over 18 hours and is not relieved by pain medicine.  You develop a new onset of abdominal pain.  You feel faint or pass  out.  You are unable to urinate. MAKE SURE YOU:   Understand these instructions.  Will watch your condition.  Will get help right away if you are not doing well or get worse. Document Released: 11/14/2005 Document Revised: 07/17/2013 Document Reviewed: 04/17/2013 [facility] Patient Information 2014  [place], [place]. Secondary Amenorrhea  Secondary amenorrhea is the stopping of menstrual flow for 3 6 months in a female who has previously had periods. There are many possible causes. Most of these causes are not serious. Usually, treating the underlying problem causing the loss of menses will return your periods to normal. CAUSES  Some common and uncommon causes of not menstruating include:  Malnutrition.  Low blood sugar (hypoglycemia).  Polycystic ovary disease.  Stress or fear.  Breastfeeding.  Hormone imbalance.  Ovarian failure.  Medicines.  Extreme obesity.  Cystic fibrosis.  Low body weight or drastic weight reduction from any cause.  Early menopause.  Removal of ovaries or uterus.  Contraceptives.  Illness.  Long-term (chronic) illnesses.  Cushing syndrome.  Thyroid problems.  Birth control pills, patches, or vaginal rings for birth control. RISK FACTORS You may be at greater risk of secondary amenorrhea if:  You have a family history of this condition.  You have an eating disorder.  You do athletic training. DIAGNOSIS  A diagnosis is made by your health care provider taking a medical history and doing a physical exam. This will include a pelvic exam to check for problems with your reproductive organs. Pregnancy must be ruled out. Often, numerous blood tests are done to measure different hormones in the body. Urine testing may be done. Specialized exams (ultrasound, CT scan, MRI, or hysteroscopy) may have to be done as well as measuring the body mass index (BMI). TREATMENT  Treatment depends on the cause of the amenorrhea. If an eating disorder is present, this can be treated with an adequate diet and therapy. Chronic illnesses may improve with treatment of the illness. Amenorrhea may be corrected with medicines, lifestyle changes, or surgery. If the amenorrhea cannot be corrected, it is sometimes possible to create a false menstruation with  medicines. HOME CARE INSTRUCTIONS  Maintain a healthy diet.  Manage weight problems.  Exercise regularly but not excessively.  Get adequate sleep.  Manage stress.  Be aware of changes in your menstrual cycle. Keep a record of when your periods occur. Note the date your period starts, how long it lasts, and any problems. SEEK MEDICAL CARE IF: Your symptoms do not get better with treatment. Document Released: 12/26/2006 Document Revised: 07/17/2013 Document Reviewed: 05/02/2013 [facility] Patient Information 2014 [place], [place]LL[place].

## 2014-05-13 NOTE — ED Notes (Signed)
Patient has missed period-last period was April 22.  Has done home preg test, the last test was 2 weeks ago and was negative.  Left flank pain for 2 days.  Last bm was this am, stool normal.  Denies urinary symptoms.

## 2014-05-13 NOTE — ED Provider Notes (Signed)
Chief Complaint   Chief Complaint  Patient presents with  . Amenorrhea    History of Present Illness   Theresa Horne is a 23 year old female who comes in tonight for a pregnancy test. Her last menstrual period was April 22. She has been sexually active without birth control. She's taken several negative pregnancy tests. She denies any pregnancy symptoms such as morning sickness, breast swelling, or tenderness. She does have some urinary frequency. Her other complaint today has been a two-day history of left flank pain. This comes and goes. It is rated a 6-7/10 in intensity. It does not radiate. It's worse if she drinks liquids or her bladder is full and better if she empties her bladder. She denies any fever, chills, nausea, vomiting, constipation, diarrhea, or blood in the stool. There's been no dysuria, urgency, or blood in the urine. She denies any history of kidney infections or kidney stones. She's had no vaginal discharge, no itching, no bleeding.  Review of Systems   Other than as noted above, the patient denies any of the following symptoms: Constitutional:  No fever, chills, weight loss or anorexia. Abdomen:  No nausea, vomiting, hematememesis, melena, diarrhea, or hematochezia. GU:  No dysuria, frequency, urgency, or hematuria. Gyn:  No vaginal discharge, itching, abnormal bleeding, dyspareunia, or pelvic pain.  PMFSH   Past medical history, family history, social history, meds, and allergies were reviewed.   Physical Exam     Vital signs:  BP 127/84  Pulse 82  Temp(Src) 98.2 F (36.8 C) (Oral)  Resp 16  SpO2 100%  LMP 03/19/2014 Gen:  Alert, oriented, in no distress. Lungs:  Breath sounds clear and equal bilaterally.  No wheezes, rales or rhonchi. Heart:  Regular rhythm.  No gallops or murmers.   Abdomen:  Soft, flat, nondistended. No organomegaly or mass. Bowel sounds are normally active. There is mild pain to palpation in the left flank area. No guarding or  rebound. Skin:  Clear, warm and dry.  No rash.  Labs   Results for orders placed during the hospital encounter of 05/13/14  CBC WITH DIFFERENTIAL      Result Value Ref Range   WBC 7.9  4.0 - 10.5 K/uL   RBC 4.85  3.87 - 5.11 MIL/uL   Hemoglobin 13.9  12.0 - 15.0 g/dL   HCT 98.142.8  19.136.0 - 47.846.0 %   MCV 88.2  78.0 - 100.0 fL   MCH 28.7  26.0 - 34.0 pg   MCHC 32.5  30.0 - 36.0 g/dL   RDW 29.514.2  62.111.5 - 30.815.5 %   Platelets 195  150 - 400 K/uL   Neutrophils Relative % 66  43 - 77 %   Neutro Abs 5.3  1.7 - 7.7 K/uL   Lymphocytes Relative 26  12 - 46 %   Lymphs Abs 2.0  0.7 - 4.0 K/uL   Monocytes Relative 6  3 - 12 %   Monocytes Absolute 0.5  0.1 - 1.0 K/uL   Eosinophils Relative 1  0 - 5 %   Eosinophils Absolute 0.1  0.0 - 0.7 K/uL   Basophils Relative 1  0 - 1 %   Basophils Absolute 0.0  0.0 - 0.1 K/uL  POCT URINALYSIS DIP (DEVICE)      Result Value Ref Range   Glucose, UA NEGATIVE  NEGATIVE mg/dL   Bilirubin Urine NEGATIVE  NEGATIVE   Ketones, ur NEGATIVE  NEGATIVE mg/dL   Specific Gravity, Urine >=1.030  1.005 - 1.030  Hgb urine dipstick NEGATIVE  NEGATIVE   pH 6.0  5.0 - 8.0   Protein, ur NEGATIVE  NEGATIVE mg/dL   Urobilinogen, UA 0.2  0.0 - 1.0 mg/dL   Nitrite NEGATIVE  NEGATIVE   Leukocytes, UA NEGATIVE  NEGATIVE  POCT PREGNANCY, URINE      Result Value Ref Range   Preg Test, Ur NEGATIVE  NEGATIVE  POCT I-STAT, CHEM 8      Result Value Ref Range   Sodium 144  137 - 147 mEq/L   Potassium 4.0  3.7 - 5.3 mEq/L   Chloride 101  96 - 112 mEq/L   BUN 11  6 - 23 mg/dL   Creatinine, Ser 6.571.10  0.50 - 1.10 mg/dL   Glucose, Bld 85  70 - 99 mg/dL   Calcium, Ion 8.461.30 (*) 1.12 - 1.23 mmol/L   TCO2 25  0 - 100 mmol/L   Hemoglobin 15.3 (*) 12.0 - 15.0 g/dL   HCT 96.245.0  95.236.0 - 84.146.0 %    Radiology   Dg Abd Acute W/chest  05/13/2014   CLINICAL DATA:  Abdominal pain for 2 days on the left.  EXAM: ACUTE ABDOMEN SERIES (ABDOMEN 2 VIEW & CHEST 1 VIEW)  COMPARISON:  None.  FINDINGS:  There is no evidence of dilated bowel loops or free intraperitoneal air. There is a 4 mm calcification projecting left of the L4 vertebra, overlapping the psoas. The calcification is not definitively identified on the upper right image, although there is mild underpenetration. No abnormal intra-abdominal mass effect. Heart size and mediastinal contours are within normal limits. Both lungs are clear.  IMPRESSION: 1. Indeterminate 4 mm calcification over the left flank, possibly a ureteral stone. 2. Nonobstructive bowel gas pattern. 3. Clear chest.   Electronically Signed   By: Tiburcio PeaJonathan  Watts M.D.   On: 05/13/2014 18:49   Assessment   The primary encounter diagnosis was Amenorrhea. A diagnosis of Kidney stone was also pertinent to this visit.  Amenorrhea is probably due to anovulatory cycles. She also appears to have a small kidney stone. This measured 3.6 mm, and should pass on its. If not she'll need to see a urologist.  Plan     1.  Meds:  The following meds were prescribed:   Discharge Medication List as of 05/13/2014  7:18 PM    START taking these medications   Details  medroxyPROGESTERone (PROVERA) 10 MG tablet Take 1 tablet (10 mg total) by mouth daily., Starting 05/13/2014, Until Discontinued, Normal    oxyCODONE-acetaminophen (PERCOCET) 5-325 MG per tablet 1 to 2 tablets every 6 hours as needed for pain., Print    tamsulosin (FLOMAX) 0.4 MG CAPS capsule Take 1 capsule (0.4 mg total) by mouth daily., Starting 05/13/2014, Until Discontinued, Normal        2.  Patient Education/Counseling:  The patient was given appropriate handouts, self care instructions, and instructed in symptomatic relief.  She should strain her urine, force fluids, and save the stone if possible. Followup with a urologist next week.  3.  Follow up:  The patient was told to follow up here if no better in 3 to 4 days, or sooner if becoming worse in any way, and given some red flag symptoms such as worsening pain,  fever, vomiting, or evidence of GI bleeding which would prompt immediate return.  Follow up here as necessary.    Reuben Likesavid C Keller, MD 05/13/14 2221

## 2014-09-29 ENCOUNTER — Encounter (HOSPITAL_COMMUNITY): Payer: Self-pay | Admitting: Emergency Medicine

## 2014-11-28 NOTE — L&D Delivery Note (Signed)
Delivery Note 0208:  Nurse call reports patient with involuntary pushing.  In room to assess. SVE 7/90/0.  Patient tearful and not coping well with contractions.  Requesting epidural, but labs not available as of yet.  Given 100mcg of IV fentanyl with minimal results.  Patient encouraged to continue breathing through contractions.  FOB encouraged to stay seated as he was having bouts of nausea and vomiting-ginger ale provided.  Patient mother at bedside, supportive.  Patient labs ready and patient being prepped for epidural when reporting urge to push.  SVE at 0229 10/100/+2, patient delivered as below with staff and family support.   At 2:31 AM, on Oct 03, 2015,  a viable female "Prince Romelena" was delivered via Vaginal, Spontaneous Delivery (Presentation: Right Occiput Anterior).  Infant fully delivered, after one forceful push, and landed on bed.  Infant then grasped by provider and noted to have nuchal cord, but good tone, color, and spontaneous cry. Nuchal cord reduced and tactile stimulation given by provider and infant placed on mother's abdomen where nurse continued tactile stimulation. Infant APGAR: 9, 9. Cord clamped, cut, and blood collected. Placenta delivered spontaneously and noted to be intact with 3VC upon inspection.  Vaginal inspection revealed a 2nd degree perineal laceration that was repaired with 3-0 vicryl on CT-1.  Patient given lidocaine 1% for local anesthetic and patient tolerated the procedure well.Bleeding constant and fundal massage yielded moderately firm uterus. Manual exploration was performed and ~2850mL of clots removed from internal os. Fundus then firm, at U/-1, and bleeding small.  Mother hemodynamically stable and infant skin to skin prior to provider exit.  Mother reports FOB vasectomy for birth control and opts to breastfeed.  Infant weight at one hour of life: 6lbs 4.3oz  Anesthesia: None  Episiotomy: None Lacerations: 2nd degree;Perineal Suture Repair: 3.0 vicryl Est. Blood  Loss (mL): 400  Mom to postpartum.  Baby to Couplet care / Skin to Skin.  Martavia Tye LYNN MSN, CNM 10/03/2015, 3:30 AM

## 2014-12-16 ENCOUNTER — Emergency Department (HOSPITAL_COMMUNITY)
Admission: EM | Admit: 2014-12-16 | Discharge: 2014-12-16 | Disposition: A | Discharge status: Home / Self Care | Payer: Self-pay | Attending: Emergency Medicine | Admitting: Emergency Medicine

## 2014-12-16 ENCOUNTER — Encounter (HOSPITAL_COMMUNITY): Payer: Self-pay | Admitting: Emergency Medicine

## 2014-12-16 DIAGNOSIS — Z792 Long term (current) use of antibiotics: Secondary | ICD-10-CM | POA: Insufficient documentation

## 2014-12-16 DIAGNOSIS — R109 Unspecified abdominal pain: Secondary | ICD-10-CM

## 2014-12-16 DIAGNOSIS — Z3202 Encounter for pregnancy test, result negative: Secondary | ICD-10-CM | POA: Insufficient documentation

## 2014-12-16 DIAGNOSIS — Z720 Tobacco use: Secondary | ICD-10-CM | POA: Insufficient documentation

## 2014-12-16 DIAGNOSIS — I10 Essential (primary) hypertension: Secondary | ICD-10-CM | POA: Insufficient documentation

## 2014-12-16 DIAGNOSIS — Z87442 Personal history of urinary calculi: Secondary | ICD-10-CM | POA: Insufficient documentation

## 2014-12-16 DIAGNOSIS — Z79899 Other long term (current) drug therapy: Secondary | ICD-10-CM | POA: Insufficient documentation

## 2014-12-16 LAB — COMPREHENSIVE METABOLIC PANEL
ALBUMIN: 4.1 g/dL (ref 3.5–5.2)
ALT: 14 U/L (ref 0–35)
ANION GAP: 7 (ref 5–15)
AST: 17 U/L (ref 0–37)
Alkaline Phosphatase: 55 U/L (ref 39–117)
BUN: 9 mg/dL (ref 6–23)
CO2: 30 mmol/L (ref 19–32)
Calcium: 9.4 mg/dL (ref 8.4–10.5)
Chloride: 102 mEq/L (ref 96–112)
Creatinine, Ser: 0.92 mg/dL (ref 0.50–1.10)
GFR calc Af Amer: >90 mL/min (ref >90)
GFR calc non Af Amer: 87 mL/min — ABNORMAL LOW (ref >90)
Glucose, Bld: 87 mg/dL (ref 70–99)
Potassium: 3.7 mmol/L (ref 3.5–5.1)
Sodium: 139 mmol/L (ref 135–145)
Total Bilirubin: 0.6 mg/dL (ref 0.3–1.2)
Total Protein: 7.5 g/dL (ref 6.0–8.3)

## 2014-12-16 LAB — CBC WITH DIFFERENTIAL/PLATELET
Basophils Absolute: 0.1 10*3/uL (ref 0.0–0.1)
Basophils Relative: 1 % (ref 0–1)
Eosinophils Absolute: 0.1 10*3/uL (ref 0.0–0.7)
Eosinophils Relative: 1 % (ref 0–5)
HEMATOCRIT: 35.3 % — AB (ref 36.0–46.0)
HEMOGLOBIN: 11.6 g/dL — AB (ref 12.0–15.0)
LYMPHS ABS: 2.5 10*3/uL (ref 0.7–4.0)
LYMPHS PCT: 35 % (ref 12–46)
MCH: 29.1 pg (ref 26.0–34.0)
MCHC: 32.9 g/dL (ref 30.0–36.0)
MCV: 88.5 fL (ref 78.0–100.0)
Monocytes Absolute: 0.5 10*3/uL (ref 0.1–1.0)
Monocytes Relative: 7 % (ref 3–12)
NEUTROS PCT: 56 % (ref 43–77)
Neutro Abs: 4.1 10*3/uL (ref 1.7–7.7)
PLATELETS: 186 10*3/uL (ref 150–400)
RBC: 3.99 MIL/uL (ref 3.87–5.11)
RDW: 13.5 % (ref 11.5–15.5)
WBC: 7.2 10*3/uL (ref 4.0–10.5)

## 2014-12-16 LAB — URINALYSIS, ROUTINE W REFLEX MICROSCOPIC
Bilirubin Urine: NEGATIVE
GLUCOSE, UA: NEGATIVE mg/dL
Hgb urine dipstick: NEGATIVE
Ketones, ur: 15 mg/dL — AB
Leukocytes, UA: NEGATIVE
NITRITE: NEGATIVE
PH: 5.5 (ref 5.0–8.0)
PROTEIN: NEGATIVE mg/dL
SPECIFIC GRAVITY, URINE: 1.028 (ref 1.005–1.030)
UROBILINOGEN UA: 1 mg/dL (ref 0.0–1.0)

## 2014-12-16 LAB — LIPASE, BLOOD: Lipase: 25 U/L (ref 11–59)

## 2014-12-16 LAB — POC URINE PREG, ED: Preg Test, Ur: NEGATIVE

## 2014-12-16 MED ORDER — HYDROMORPHONE HCL 1 MG/ML IJ SOLN
0.5000 mg | Freq: Once | INTRAMUSCULAR | Status: AC
  Administered 2014-12-16: 0.5 mg via INTRAVENOUS
  Filled 2014-12-16: qty 1

## 2014-12-16 MED ORDER — SODIUM CHLORIDE 0.9 % IV BOLUS (SEPSIS)
1000.0000 mL | INTRAVENOUS | Status: AC
  Administered 2014-12-16: 1000 mL via INTRAVENOUS

## 2014-12-16 MED ORDER — OXYCODONE-ACETAMINOPHEN 5-325 MG PO TABS
1.0000 | ORAL_TABLET | Freq: Four times a day (QID) | ORAL | Status: DC | PRN
Start: 2014-12-16 — End: 2015-01-30

## 2014-12-16 NOTE — ED Notes (Signed)
Pt. reports left lateral abdominal pain onset this evening , denies nausea or vomitting , no diarrhea or fever . No dysuria or hematuria . Pain worse with movement .

## 2014-12-16 NOTE — Discharge Instructions (Signed)
Abdominal Pain, Women °Abdominal (stomach, pelvic, or belly) pain can be caused by many things. It is important to tell your doctor: °· The location of the pain. °· Does it come and go or is it present all the time? °· Are there things that start the pain (eating certain foods, exercise)? °· Are there other symptoms associated with the pain (fever, nausea, vomiting, diarrhea)? °All of this is helpful to know when trying to find the cause of the pain. °CAUSES  °· Stomach: virus or bacteria infection, or ulcer. °· Intestine: appendicitis (inflamed appendix), regional ileitis (Crohn's disease), ulcerative colitis (inflamed colon), irritable bowel syndrome, diverticulitis (inflamed diverticulum of the colon), or cancer of the stomach or intestine. °· Gallbladder disease or stones in the gallbladder. °· Kidney disease, kidney stones, or infection. °· Pancreas infection or cancer. °· Fibromyalgia (pain disorder). °· Diseases of the female organs: °¨ Uterus: fibroid (non-cancerous) tumors or infection. °¨ Fallopian tubes: infection or tubal pregnancy. °¨ Ovary: cysts or tumors. °¨ Pelvic adhesions (scar tissue). °¨ Endometriosis (uterus lining tissue growing in the pelvis and on the pelvic organs). °¨ Pelvic congestion syndrome (female organs filling up with blood just before the menstrual period). °¨ Pain with the menstrual period. °¨ Pain with ovulation (producing an egg). °¨ Pain with an IUD (intrauterine device, birth control) in the uterus. °¨ Cancer of the female organs. °· Functional pain (pain not caused by a disease, may improve without treatment). °· Psychological pain. °· Depression. °DIAGNOSIS  °Your doctor will decide the seriousness of your pain by doing an examination. °· Blood tests. °· X-rays. °· Ultrasound. °· CT scan (computed tomography, special type of X-ray). °· MRI (magnetic resonance imaging). °· Cultures, for infection. °· Barium enema (dye inserted in the large intestine, to better view it with  X-rays). °· Colonoscopy (looking in intestine with a lighted tube). °· Laparoscopy (minor surgery, looking in abdomen with a lighted tube). °· Major abdominal exploratory surgery (looking in abdomen with a large incision). °TREATMENT  °The treatment will depend on the cause of the pain.  °· Many cases can be observed and treated at home. °· Over-the-counter medicines recommended by your caregiver. °· Prescription medicine. °· Antibiotics, for infection. °· Birth control pills, for painful periods or for ovulation pain. °· Hormone treatment, for endometriosis. °· Nerve blocking injections. °· Physical therapy. °· Antidepressants. °· Counseling with a psychologist or psychiatrist. °· Minor or major surgery. °HOME CARE INSTRUCTIONS  °· Do not take laxatives, unless directed by your caregiver. °· Take over-the-counter pain medicine only if ordered by your caregiver. Do not take aspirin because it can cause an upset stomach or bleeding. °· Try a clear liquid diet (broth or water) as ordered by your caregiver. Slowly move to a bland diet, as tolerated, if the pain is related to the stomach or intestine. °· Have a thermometer and take your temperature several times a day, and record it. °· Bed rest and sleep, if it helps the pain. °· Avoid sexual intercourse, if it causes pain. °· Avoid stressful situations. °· Keep your follow-up appointments and tests, as your caregiver orders. °· If the pain does not go away with medicine or surgery, you may try: °¨ Acupuncture. °¨ Relaxation exercises (yoga, meditation). °¨ Group therapy. °¨ Counseling. °SEEK MEDICAL CARE IF:  °· You notice certain foods cause stomach pain. °· Your home care treatment is not helping your pain. °· You need stronger pain medicine. °· You want your IUD removed. °· You feel faint or   lightheaded. °· You develop nausea and vomiting. °· You develop a rash. °· You are having side effects or an allergy to your medicine. °SEEK IMMEDIATE MEDICAL CARE IF:  °· Your  pain does not go away or gets worse. °· You have a fever. °· Your pain is felt only in portions of the abdomen. The right side could possibly be appendicitis. The left lower portion of the abdomen could be colitis or diverticulitis. °· You are passing blood in your stools (bright red or black tarry stools, with or without vomiting). °· You have blood in your urine. °· You develop chills, with or without a fever. °· You pass out. °MAKE SURE YOU:  °· Understand these instructions. °· Will watch your condition. °· Will get help right away if you are not doing well or get worse. °Document Released: 09/11/2007 Document Revised: 03/31/2014 Document Reviewed: 10/01/2009 °ExitCare® Patient Information ©2015 [employer]. This information is not intended to replace advice given to you by your health care provider. Make sure you discuss any questions you have with your health care provider. ° °

## 2014-12-16 NOTE — ED Provider Notes (Signed)
CSN: [id number]     Arrival date & time 12/16/14  0027 History  This chart was scribed for Purvis Sheffield, MD by Annye Asa, ED Scribe. This patient was seen in room A05C/A05C and the patient's care was started at 12:56 AM.    Chief Complaint  Patient presents with  . Abdominal Pain   Patient is a 24 y.o. female presenting with abdominal pain. The history is provided by the patient. No language interpreter was used.  Abdominal Pain Pain location:  L flank Pain quality: sharp   Pain radiates to:  Does not radiate Pain severity:  Moderate Onset quality:  Sudden Duration:  4 hours Timing:  Intermittent Progression:  Unchanged Chronicity:  New Relieved by:  None tried Worsened by:  Movement Ineffective treatments:  None tried Associated symptoms: no chest pain, no chills, no cough, no diarrhea, no dysuria, no fever, no hematuria, no nausea, no shortness of breath, no sore throat, no vaginal discharge and no vomiting      HPI Comments: Theresa Horne is an otherwise healthy 24 y.o. female who presents to the Emergency Department complaining of sudden onset, "sharp," left flank pain beginning at work this evening around 21:00. She explains that this pain was constant at onset but has become intermittent with time; she currently rates her pain as a 5/10. It is worse with movement. 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 She denies any urinary or vaginal symptoms, fevers, vomiting, diarrhea.   Patient reports that she has prior experience with similar symptoms; at that time, she had kidney stones. Last episode of kidney stones was 08/15.   LNMP beginning of January  Past Medical History  Diagnosis Date  . Hypertension    History reviewed. No pertinent past surgical history. Family History  Problem Relation Age of Onset  . Hypertension Father   . Diabetes Paternal Uncle   . Renal Disease Maternal Grandmother    History  Substance Use Topics  . Smoking status:  Current Every Day Smoker  . Smokeless tobacco: Never Used  . Alcohol Use: Yes   OB History    Gravida Para Term Preterm AB TAB SAB Ectopic Multiple Living   Review of Systems  Constitutional: Negative for fever and chills.  HENT: Negative for sore throat.   Eyes: Negative for visual disturbance.  Respiratory: Negative for cough and shortness of breath.   Cardiovascular: Negative for chest pain.  Gastrointestinal: Negative for nausea, vomiting and diarrhea.  Endocrine: Negative for polyuria.  Genitourinary: Positive for flank pain. Negative for dysuria, frequency, hematuria and vaginal discharge.  Musculoskeletal: Negative for back pain.  Skin: Negative for rash.  Allergic/Immunologic: Negative for immunocompromised state.  Neurological: Negative for weakness.  Psychiatric/Behavioral: Negative for behavioral problems and confusion.   Allergies  Review of patient's allergies indicates no known allergies.  Home Medications   Prior to Admission medications   Medication Sig Start Date End Date Taking? Authorizing Provider  cephALEXin (KEFLEX) 500 MG capsule Take 1 capsule (500 mg total) by mouth 4 (four) times daily. 08/08/13   Rodolph Bong, MD  fluconazole (DIFLUCAN) 100 MG tablet Take one tab today. If your symptoms do not resolve, take second pill in 72 hours. 10/12/13   Amber Nydia Bouton, MD  medroxyPROGESTERone (PROVERA) 10 MG tablet Take 1 tablet (10 mg total) by mouth daily. 05/13/14   Reuben Likes, MD  metroNIDAZOLE (FLAGYL) 500 MG tablet  Take 1 tablet (500 mg total) by mouth 2 (two) times daily. 04/10/14   Ria ClockJennifer Lee H Presson, PA  Norethindrone-Ethinyl Estradiol-Fe Biphas (LO LOESTRIN FE) 1 MG-10 MCG / 10 MCG tablet Take 1 tablet by mouth daily. 11/05/12   Jaymes GraffNaima Dillard, MD  oxyCODONE-acetaminophen (PERCOCET) 5-325 MG per tablet 1 to 2 tablets every 6 hours as needed for pain. 05/13/14   Reuben Likesavid C Keller, MD  tamsulosin (FLOMAX) 0.4 MG CAPS capsule Take 1  capsule (0.4 mg total) by mouth daily. 05/13/14   Reuben Likesavid C Keller, MD   BP 125/77 mmHg  Pulse 76  Temp(Src) 97.9 F (36.6 C) (Oral)  Resp 18  Ht 5\' 4"  (1.626 m)  Wt 200 lb (90.719 kg)  BMI 34.31 kg/m2  SpO2 100%  LMP 12/04/2014 Physical Exam  Constitutional: She is oriented to person, place, and time. She appears well-developed and well-nourished. No distress.  HENT:  Head: Normocephalic and atraumatic.  Mouth/Throat: Oropharynx is clear and moist. No oropharyngeal exudate.  Eyes: EOM are normal. Pupils are equal, round, and reactive to light.  Neck: Normal range of motion. Neck supple.  Cardiovascular: Normal rate, regular rhythm and normal heart sounds.  Exam reveals no gallop and no friction rub.   No murmur heard. Pulmonary/Chest: Effort normal. No respiratory distress. She has no wheezes. She has no rales.  Abdominal: Soft. There is no tenderness. There is no rebound and no guarding.  Musculoskeletal: Normal range of motion. She exhibits no edema.  No CVA tenderness bilaterally   Neurological: She is alert and oriented to person, place, and time.  Skin: Skin is warm and dry. No rash noted.  Psychiatric: She has a normal mood and affect. Her behavior is normal.  Nursing note and vitals reviewed.   ED Course  Procedures   DIAGNOSTIC STUDIES: Oxygen Saturation is 100% on RA, normal by my interpretation.    COORDINATION OF CARE: 1:01 AM Discussed treatment plan with pt at bedside and pt agreed to plan.   Labs Review Labs Reviewed  COMPREHENSIVE METABOLIC PANEL - Abnormal; Notable for the following:    GFR calc non Af Amer 87 (*)    All other components within normal limits  CBC WITH DIFFERENTIAL - Abnormal; Notable for the following:    Hemoglobin 11.6 (*)    HCT 35.3 (*)    All other components within normal limits  URINALYSIS, ROUTINE W REFLEX MICROSCOPIC - Abnormal; Notable for the following:    Ketones, ur 15 (*)    All other components within normal limits   LIPASE, BLOOD  POC URINE PREG, ED    Imaging Review No results found.   EKG Interpretation None      MDM   Final diagnoses:  Left flank pain   1:06 AM 24 y.o. female with self-reported history of kidney stones who presents with left flank pain which began suddenly several hours ago. She denies any fevers, vomiting, or other associated symptoms. No GU symptoms noted. Vital signs unremarkable here. She appears well on exam. She states her symptoms are consistent with previous kidney stone. Screening lab work sent prior to my evaluation. Will treat symptomatically.   2:56 AM: I interpreted/reviewed the labs and/or imaging which were non-contributory.  Pt feeling better.  Possibly passed a stone although no blood in UA. I have discussed the diagnosis/risks/treatment options with the patient and believe the pt to be eligible for discharge home to follow-up with her pcp as needed. We also discussed returning to the ED immediately  if new or worsening sx occur. We discussed the sx which are most concerning (e.g., worsening pain, fever) that necessitate immediate return. Medications administered to the patient during their visit and any new prescriptions provided to the patient are listed below.  Medications given during this visit Medications  sodium chloride 0.9 % bolus 1,000 mL (0 mLs Intravenous Stopped 12/16/14 0210)  HYDROmorphone (DILAUDID) injection 0.5 mg (0.5 mg Intravenous Given 12/16/14 0110)    New Prescriptions   OXYCODONE-ACETAMINOPHEN (PERCOCET) 5-325 MG PER TABLET    Take 1 tablet by mouth every 6 (six) hours as needed for moderate pain.      I personally performed the services described in this documentation, which was scribed in my presence. The recorded information has been reviewed and is accurate.      Purvis Sheffield, MD 12/16/14 [phone]

## 2015-01-30 ENCOUNTER — Encounter (HOSPITAL_COMMUNITY): Payer: Self-pay | Admitting: *Deleted

## 2015-01-30 ENCOUNTER — Inpatient Hospital Stay (HOSPITAL_COMMUNITY)
Admission: AD | Admit: 2015-01-30 | Discharge: 2015-01-30 | Disposition: A | Discharge status: Home / Self Care | Payer: PRIVATE HEALTH INSURANCE | Source: Ambulatory Visit | Attending: Obstetrics & Gynecology | Admitting: Obstetrics & Gynecology

## 2015-01-30 ENCOUNTER — Inpatient Hospital Stay (HOSPITAL_COMMUNITY): Payer: PRIVATE HEALTH INSURANCE

## 2015-01-30 DIAGNOSIS — O99331 Smoking (tobacco) complicating pregnancy, first trimester: Secondary | ICD-10-CM | POA: Insufficient documentation

## 2015-01-30 DIAGNOSIS — O9989 Other specified diseases and conditions complicating pregnancy, childbirth and the puerperium: Secondary | ICD-10-CM

## 2015-01-30 DIAGNOSIS — O26899 Other specified pregnancy related conditions, unspecified trimester: Secondary | ICD-10-CM

## 2015-01-30 DIAGNOSIS — R109 Unspecified abdominal pain: Secondary | ICD-10-CM

## 2015-01-30 DIAGNOSIS — F17210 Nicotine dependence, cigarettes, uncomplicated: Secondary | ICD-10-CM | POA: Insufficient documentation

## 2015-01-30 DIAGNOSIS — Z3A01 Less than 8 weeks gestation of pregnancy: Secondary | ICD-10-CM | POA: Diagnosis not present

## 2015-01-30 LAB — CBC
HCT: 37 % (ref 36.0–46.0)
Hemoglobin: 12.4 g/dL (ref 12.0–15.0)
MCH: 29.5 pg (ref 26.0–34.0)
MCHC: 33.5 g/dL (ref 30.0–36.0)
MCV: 87.9 fL (ref 78.0–100.0)
Platelets: 180 10*3/uL (ref 150–400)
RBC: 4.21 MIL/uL (ref 3.87–5.11)
RDW: 13.5 % (ref 11.5–15.5)
WBC: 6.3 10*3/uL (ref 4.0–10.5)

## 2015-01-30 LAB — URINALYSIS, ROUTINE W REFLEX MICROSCOPIC
Bilirubin Urine: NEGATIVE
Glucose, UA: NEGATIVE mg/dL
Hgb urine dipstick: NEGATIVE
Ketones, ur: NEGATIVE mg/dL
Leukocytes, UA: NEGATIVE
NITRITE: NEGATIVE
PROTEIN: NEGATIVE mg/dL
SPECIFIC GRAVITY, URINE: 1.025 (ref 1.005–1.030)
UROBILINOGEN UA: 0.2 mg/dL (ref 0.0–1.0)
pH: 6 (ref 5.0–8.0)

## 2015-01-30 LAB — ABO/RH: ABO/RH(D): O POS

## 2015-01-30 LAB — HCG, QUANTITATIVE, PREGNANCY: hCG, Beta Chain, Quant, S: 1239 m[IU]/mL — ABNORMAL HIGH (ref <5)

## 2015-01-30 LAB — WET PREP, GENITAL
TRICH WET PREP: NONE SEEN
YEAST WET PREP: NONE SEEN

## 2015-01-30 LAB — POCT PREGNANCY, URINE: Preg Test, Ur: POSITIVE — AB

## 2015-01-30 NOTE — Discharge Instructions (Signed)

## 2015-01-30 NOTE — MAU Note (Signed)
LMP 12/30/14 - pos HPT yesterday.  Lower abd cramping for the past 2-3 days.  Denies bleeding or discharge.

## 2015-01-30 NOTE — MAU Provider Note (Signed)
Chief Complaint: Abdominal Pain   None     SUBJECTIVE Called pt from lobby initially to triage and discuss evaluation.  Discussed planned labs, including quant hcg and HIV, and ultrasound with pt.  Pt agrees to plan of care and returns to lobby until room available.  HPI: Theresa Horne is a 24 y.o. G3P1011 at [redacted]w[redacted]d by LMP who presents to maternity admissions reporting abdominal cramping x 2-3 days and late menses. She reports positive pregnancy test at home in last few days.  Her pain is centrally located and is described as "like my usual menstrual cramps".  Patient's last menstrual period was 12/30/2014 (exact date).  She denies vaginal bleeding, vaginal itching/burning, urinary symptoms, h/a, dizziness, n/v, or fever/chills.     Past Medical History  Diagnosis Date  . Hypertension   . Preterm labor    Past Surgical History  Procedure Laterality Date  . No past surgeries     History   Social History  . Marital Status: Single    Spouse Name: N/A  . Number of Children: N/A  . Years of Education: N/A   Occupational History  . Not on file.   Social History Main Topics  . Smoking status: Current Every Day Smoker -- 0.25 packs/day    Types: Cigarettes  . Smokeless tobacco: Never Used  . Alcohol Use: Yes  . Drug Use: No  . Sexual Activity: Yes    Birth Control/ Protection: None   Other Topics Concern  . Not on file   Social History Narrative   No current facility-administered medications on file prior to encounter.   Current Outpatient Prescriptions on File Prior to Encounter  Medication Sig Dispense Refill  . cephALEXin (KEFLEX) 500 MG capsule Take 1 capsule (500 mg total) by mouth 4 (four) times daily. (Patient not taking: Reported on 12/16/2014) 40 capsule 0  . fluconazole (DIFLUCAN) 100 MG tablet Take one tab today. If your symptoms do not resolve, take second pill in 72 hours. (Patient not taking: Reported on 12/16/2014) 2 tablet 0  . medroxyPROGESTERone (PROVERA)  10 MG tablet Take 1 tablet (10 mg total) by mouth daily. (Patient not taking: Reported on 01/30/2015) 10 tablet 0  . metroNIDAZOLE (FLAGYL) 500 MG tablet Take 1 tablet (500 mg total) by mouth 2 (two) times daily. (Patient not taking: Reported on 12/16/2014) 14 tablet 0  . Norethindrone-Ethinyl Estradiol-Fe Biphas (LO LOESTRIN FE) 1 MG-10 MCG / 10 MCG tablet Take 1 tablet by mouth daily. (Patient not taking: Reported on 01/30/2015) 1 Package 6  . oxyCODONE-acetaminophen (PERCOCET) 5-325 MG per tablet Take 1 tablet by mouth every 6 (six) hours as needed for moderate pain. (Patient not taking: Reported on 01/30/2015) 10 tablet 0  . tamsulosin (FLOMAX) 0.4 MG CAPS capsule Take 1 capsule (0.4 mg total) by mouth daily. (Patient not taking: Reported on 01/30/2015) 7 capsule 0   No Known Allergies  ROS: Pertinent items in HPI  OBJECTIVE Blood pressure 138/85, pulse 74, temperature 98.1 F (36.7 C), temperature source Oral, resp. rate 18, height  (1.626 m), weight 91.286 kg (201 lb 4 oz), last menstrual period 12/30/2014, SpO2 100 %. GENERAL: Well-developed, well-nourished female in no acute distress.  HEENT: Normocephalic HEART: normal rate RESP: normal effort ABDOMEN: Soft, non-tender EXTREMITIES: Nontender, no edema NEURO: Alert and oriented Pelvic exam: Cervix pink, visually closed, without lesion, moderate amount frothy white discharge, vaginal walls and external genitalia normal Bimanual exam: Cervix 0/long/high, firm, anterior, neg CMT, uterus nontender, nonenlarged, adnexa without tenderness,  enlargement, or mass  LAB RESULTS Results for orders placed or performed during the hospital encounter of 01/30/15 (from the past 24 hour(s))  Urinalysis, Routine w reflex microscopic     Status: None   Collection Time: 01/30/15  1:01 PM  Result Value Ref Range   Color, Urine YELLOW YELLOW   APPearance CLEAR CLEAR   Specific Gravity, Urine 1.025 1.005 - 1.030   pH 6.0 5.0 - 8.0   Glucose, UA NEGATIVE  NEGATIVE mg/dL   Hgb urine dipstick NEGATIVE NEGATIVE   Bilirubin Urine NEGATIVE NEGATIVE   Ketones, ur NEGATIVE NEGATIVE mg/dL   Protein, ur NEGATIVE NEGATIVE mg/dL   Urobilinogen, UA 0.2 0.0 - 1.0 mg/dL   Nitrite NEGATIVE NEGATIVE   Leukocytes, UA NEGATIVE NEGATIVE  Pregnancy, urine POC     Status: Abnormal   Collection Time: 01/30/15  1:09 PM  Result Value Ref Range   Preg Test, Ur POSITIVE (A) NEGATIVE  CBC     Status: None   Collection Time: 01/30/15  2:00 PM  Result Value Ref Range   WBC 6.3 4.0 - 10.5 K/uL   RBC 4.21 3.87 - 5.11 MIL/uL   Hemoglobin 12.4 12.0 - 15.0 g/dL   HCT 16.137.0 09.636.0 - 04.546.0 %   MCV 87.9 78.0 - 100.0 fL   MCH 29.5 26.0 - 34.0 pg   MCHC 33.5 30.0 - 36.0 g/dL   RDW 40.913.5 81.111.5 - 91.415.5 %   Platelets 180 150 - 400 K/uL  hCG, quantitative, pregnancy     Status: Abnormal   Collection Time: 01/30/15  2:00 PM  Result Value Ref Range   hCG, Beta Chain, Quant, S 1239 (H) <5 mIU/mL  ABO/Rh     Status: None   Collection Time: 01/30/15  2:00 PM  Result Value Ref Range   ABO/RH(D) O POS   Wet prep, genital     Status: Abnormal   Collection Time: 01/30/15  3:10 PM  Result Value Ref Range   Yeast Wet Prep HPF POC NONE SEEN NONE SEEN   Trich, Wet Prep NONE SEEN NONE SEEN   Clue Cells Wet Prep HPF POC FEW (A) NONE SEEN   WBC, Wet Prep HPF POC FEW (A) NONE SEEN    IMAGING [place] Ob Comp Less 14 Wks  01/30/2015   CLINICAL DATA:  First trimester pregnancy with lower abdominal pain for 3 days, quantitative beta HCG 1,239  EXAM: OBSTETRIC <14 WK US AND TRANSVAGINAL OB US  TECHNIQUE: Both transabdominal and transvaginal ultrasound examinations were performed for complete evaluation of the gestation as well as the maternal uterus, adnexal regions, and pelvic cul-de-sac. Transvaginal technique was performed to assess early pregnancy.  COMPARISON:  None.  FINDINGS: Intrauterine gestational sac: Not visualized  Yolk sac:  Not visualized  Embryo:  Not visualized  Cardiac Activity: Not  visualized  Maternal uterus/adnexae: Left ovary is 31 x 19 x 17 mm and appears normal. Right ovary measures about 39 x 21 x 23 mm and is inseparable from a dilated fluid-filled tubular structure in the right adnexa. There is no free fluid in the pelvis.  IMPRESSION: No intrauterine gestational identified. Dilated fluid-filled tubular structure right adnexa, measuring between 0.5 and 2.0 cm in diameter. The possibility of hydrosalpinx is considered, significance uncertain. No specific evidence for ectopic pregnancy or intrauterine gestation at this time. Recommend follow-up US in 10-14 days for definitive diagnosis unless symptoms require imaging sooner. This recommendation follows SRU consensus guidelines: Diagnostic Criteria for Nonviable Pregnancy Early in the First  Trimester. Malva Limes Med 2013; 161:0960-45.   Electronically Signed   By: Esperanza Heir M.D.   On: 01/30/2015 15:04   US Ob Transvaginal  01/30/2015   CLINICAL DATA:  First trimester pregnancy with lower abdominal pain for 3 days, quantitative beta HCG 1,239  EXAM: OBSTETRIC <14 WK [name] AND TRANSVAGINAL OB US  TECHNIQUE: Both transabdominal and transvaginal ultrasound examinations were performed for complete evaluation of the gestation as well as the maternal uterus, adnexal regions, and pelvic cul-de-sac. Transvaginal technique was performed to assess early pregnancy.  COMPARISON:  None.  FINDINGS: Intrauterine gestational sac: Not visualized  Yolk sac:  Not visualized  Embryo:  Not visualized  Cardiac Activity: Not visualized  Maternal uterus/adnexae: Left ovary is 31 x 19 x 17 mm and appears normal. Right ovary measures about 39 x 21 x 23 mm and is inseparable from a dilated fluid-filled tubular structure in the right adnexa. There is no free fluid in the pelvis.  IMPRESSION: No intrauterine gestational identified. Dilated fluid-filled tubular structure right adnexa, measuring between 0.5 and 2.0 cm in diameter. The possibility of hydrosalpinx is  considered, significance uncertain. No specific evidence for ectopic pregnancy or intrauterine gestation at this time. Recommend follow-up US in 10-14 days for definitive diagnosis unless symptoms require imaging sooner. This recommendation follows SRU consensus guidelines: Diagnostic Criteria for Nonviable Pregnancy Early in the First Trimester. Malva Limes Med 2013; 409:8119-14.   Electronically Signed   By: Esperanza Heir M.D.   On: 01/30/2015 15:04    ASSESSMENT 1. Abdominal pain complicating pregnancy, antepartum     PLAN Consult Dr Debroah Loop Discharge home with ectopic precautions F/U in MAU in 48 hours for repeat quant hcg Return to MAU sooner as needed for emergencies    Medication List    STOP taking these medications        cephALEXin 500 MG capsule  Commonly known as:  KEFLEX     fluconazole 100 MG tablet  Commonly known as:  DIFLUCAN     ibuprofen 200 MG tablet  Commonly known as:  ADVIL,MOTRIN     medroxyPROGESTERone 10 MG tablet  Commonly known as:  PROVERA     metroNIDAZOLE 500 MG tablet  Commonly known as:  FLAGYL     Norethindrone-Ethinyl Estradiol-Fe Biphas 1 MG-10 MCG / 10 MCG tablet  Commonly known as:  LO LOESTRIN FE     oxyCODONE-acetaminophen 5-325 MG per tablet  Commonly known as:  PERCOCET     tamsulosin 0.4 MG Caps capsule  Commonly known as:  FLOMAX       Follow-up Information    Follow up with [facility] [place] [facility].   Why:  On Sunday, 02/01/15, after 2 pm for labs.     Contact information:   [address] [id number] mc [place] [address] [phone]      Sharen Counter Certified Nurse-Midwife 01/30/2015  3:47 PM

## 2015-01-31 LAB — HIV ANTIBODY (ROUTINE TESTING W REFLEX): HIV SCREEN 4TH GENERATION: NONREACTIVE

## 2015-02-01 ENCOUNTER — Inpatient Hospital Stay (HOSPITAL_COMMUNITY)
Admission: AD | Admit: 2015-02-01 | Discharge: 2015-02-01 | Disposition: A | Discharge status: Home / Self Care | Payer: PRIVATE HEALTH INSURANCE | Source: Ambulatory Visit | Attending: Obstetrics & Gynecology | Admitting: Obstetrics & Gynecology

## 2015-02-01 DIAGNOSIS — O9989 Other specified diseases and conditions complicating pregnancy, childbirth and the puerperium: Secondary | ICD-10-CM

## 2015-02-01 DIAGNOSIS — R109 Unspecified abdominal pain: Secondary | ICD-10-CM | POA: Diagnosis not present

## 2015-02-01 DIAGNOSIS — O26899 Other specified pregnancy related conditions, unspecified trimester: Secondary | ICD-10-CM

## 2015-02-01 DIAGNOSIS — Z3A01 Less than 8 weeks gestation of pregnancy: Secondary | ICD-10-CM | POA: Diagnosis not present

## 2015-02-01 LAB — HCG, QUANTITATIVE, PREGNANCY: HCG, BETA CHAIN, QUANT, S: 3320 m[IU]/mL — AB (ref <5)

## 2015-02-01 NOTE — MAU Note (Signed)
Pt presents to MAU for repeat BHCG. Denies any pain of vaginal bleeding

## 2015-02-01 NOTE — MAU Provider Note (Signed)
. History   Chief Complaint:  Repeat BHCG    Theresa Horne is  24 y.o. G2P0101 Patient's last menstrual period was 12/30/2014 (exact date).. Patient is here for follow up of quantitative HCG and ongoing surveillance of pregnancy status.   She is 5929w5d weeks gestation  by LMP.    Since her last visit, the patient is without new complaint.   The patient reports bleeding as  none now.   General ROS:  negative  Her previous Quantitative HCG values are:  Results for Theresa Horne, Theresa Horne (MRN [id number]) as of 02/01/2015 15:24  Ref. Range 01/30/2015 14:00  hCG, Beta Chain, Quant, S Latest Range: <5 mIU/mL 1239 (H)    Physical Exam   Last menstrual period 12/30/2014.  Focused Gynecological Exam: examination not indicated  Labs: Results for orders placed or performed during the hospital encounter of 02/01/15 (from the past 24 hour(s))  hCG, quantitative, pregnancy   Collection Time: 02/01/15  3:22 PM  Result Value Ref Range   hCG, Beta Chain, Quant, S 3320 (H) <5 mIU/mL    Ultrasound Studies:   [place] Ob Comp Less 14 Wks  01/30/2015   CLINICAL DATA:  First trimester pregnancy with lower abdominal pain for 3 days, quantitative beta HCG 1,239  EXAM: OBSTETRIC <14 WK US AND TRANSVAGINAL OB US  TECHNIQUE: Both transabdominal and transvaginal ultrasound examinations were performed for complete evaluation of the gestation as well as the maternal uterus, adnexal regions, and pelvic cul-de-sac. Transvaginal technique was performed to assess early pregnancy.  COMPARISON:  None.  FINDINGS: Intrauterine gestational sac: Not visualized  Yolk sac:  Not visualized  Embryo:  Not visualized  Cardiac Activity: Not visualized  Maternal uterus/adnexae: Left ovary is 31 x 19 x 17 mm and appears normal. Right ovary measures about 39 x 21 x 23 mm and is inseparable from a dilated fluid-filled tubular structure in the right adnexa. There is no free fluid in the pelvis.  IMPRESSION: No intrauterine gestational identified.  Dilated fluid-filled tubular structure right adnexa, measuring between 0.5 and 2.0 cm in diameter. The possibility of hydrosalpinx is considered, significance uncertain. No specific evidence for ectopic pregnancy or intrauterine gestation at this time. Recommend follow-up US in 10-14 days for definitive diagnosis unless symptoms require imaging sooner. This recommendation follows SRU consensus guidelines: Diagnostic Criteria for Nonviable Pregnancy Early in the First Trimester. Malva Limes Engl J Med 2013; 409:8119-14; 369:1443-51.   Electronically Signed   By: Esperanza Heiraymond  Rubner M.D.   On: 01/30/2015 15:04   [place] Ob Transvaginal  01/30/2015   CLINICAL DATA:  First trimester pregnancy with lower abdominal pain for 3 days, quantitative beta HCG 1,239  EXAM: OBSTETRIC <14 WK US AND TRANSVAGINAL OB US  TECHNIQUE: Both transabdominal and transvaginal ultrasound examinations were performed for complete evaluation of the gestation as well as the maternal uterus, adnexal regions, and pelvic cul-de-sac. Transvaginal technique was performed to assess early pregnancy.  COMPARISON:  None.  FINDINGS: Intrauterine gestational sac: Not visualized  Yolk sac:  Not visualized  Embryo:  Not visualized  Cardiac Activity: Not visualized  Maternal uterus/adnexae: Left ovary is 31 x 19 x 17 mm and appears normal. Right ovary measures about 39 x 21 x 23 mm and is inseparable from a dilated fluid-filled tubular structure in the right adnexa. There is no free fluid in the pelvis.  IMPRESSION: No intrauterine gestational identified. Dilated fluid-filled tubular structure right adnexa, measuring between 0.5 and 2.0 cm in diameter. The possibility of hydrosalpinx is considered, significance uncertain. No specific  evidence for ectopic pregnancy or intrauterine gestation at this time. Recommend follow-up US in 10-14 days for definitive diagnosis unless symptoms require imaging sooner. This recommendation follows SRU consensus guidelines: Diagnostic Criteria for Nonviable  Pregnancy Early in the First Trimester. Malva Limes Med 2013; 161:0960-45.   Electronically Signed   By: Esperanza Heir M.D.   On: 01/30/2015 15:04    Assessment: [redacted]w[redacted]d weeks gestation with appropriate rise in [place].  Plan: Discharge home in stable condition. Follow-up ultrasound in approximately 10 days for viability. First trimester precautions. Plans to get prenatal care at [facility], but hasn't applied for pregnancy Medicaid. Pregnancy verification letter given.  Theresa Horne 02/01/2015, 3:24 PM

## 2015-02-01 NOTE — Discharge Instructions (Signed)

## 2015-02-02 LAB — GC/CHLAMYDIA PROBE AMP (~~LOC~~) NOT AT ARMC
CHLAMYDIA, DNA PROBE: NEGATIVE
Neisseria Gonorrhea: NEGATIVE

## 2015-02-12 ENCOUNTER — Inpatient Hospital Stay (HOSPITAL_COMMUNITY)
Admission: AD | Admit: 2015-02-12 | Discharge: 2015-02-12 | Disposition: A | Discharge status: Home / Self Care | Payer: Medicaid Other | Source: Ambulatory Visit | Attending: Family Medicine | Admitting: Family Medicine

## 2015-02-12 ENCOUNTER — Ambulatory Visit (HOSPITAL_COMMUNITY)
Admission: RE | Admit: 2015-02-12 | Discharge: 2015-02-12 | Disposition: A | Discharge status: Home / Self Care | Payer: PRIVATE HEALTH INSURANCE | Source: Ambulatory Visit | Attending: Advanced Practice Midwife | Admitting: Advanced Practice Midwife

## 2015-02-12 DIAGNOSIS — O208 Other hemorrhage in early pregnancy: Secondary | ICD-10-CM | POA: Diagnosis not present

## 2015-02-12 DIAGNOSIS — Z712 Person consulting for explanation of examination or test findings: Secondary | ICD-10-CM

## 2015-02-12 DIAGNOSIS — Z7189 Other specified counseling: Secondary | ICD-10-CM

## 2015-02-12 DIAGNOSIS — Z3A01 Less than 8 weeks gestation of pregnancy: Secondary | ICD-10-CM | POA: Insufficient documentation

## 2015-02-12 DIAGNOSIS — O26899 Other specified pregnancy related conditions, unspecified trimester: Secondary | ICD-10-CM

## 2015-02-12 DIAGNOSIS — R109 Unspecified abdominal pain: Secondary | ICD-10-CM | POA: Diagnosis not present

## 2015-02-12 DIAGNOSIS — O9989 Other specified diseases and conditions complicating pregnancy, childbirth and the puerperium: Secondary | ICD-10-CM | POA: Diagnosis not present

## 2015-02-12 NOTE — Discharge Instructions (Signed)
Prenatal Care Providers °[facility]    [facility] ° Phone- [phone]     Phone: [phone] °         °[facility]                      [facility] ° @[place]     Phone: [phone] ° Phone: [phone] °        [facility] °[facility]     Phone: [phone] ° Phone: [phone]   °        [facility] °[facility] [place]                hone: [phone] ° Phone: [phone] °        [facility] °Dr. Bernard Marshall      Phone: [phone] ° Phone: [phone] °        [facility] °[facility]                Phone: [phone] ° Women’s Health  ° Phone:[phone]    [facility] ([place]) °         Phone: [phone] °[facility] °  Phone: [phone] °Safe Medications in Pregnancy  ° °Acne: °Benzoyl Peroxide °Salicylic Acid ° °Backache/Headache: °Tylenol: 2 regular strength every 4 hours OR °             2 Extra strength every 6 hours ° °Colds/Coughs/Allergies: °Benadryl (alcohol free) 25 mg every 6 hours as needed °Breath right strips °Claritin °Cepacol throat lozenges °Chloraseptic throat spray °Cold-Eeze- up to three times per day °Cough drops, alcohol free °Flonase (by prescription only) °Guaifenesin °Mucinex °Robitussin DM (plain only, alcohol free) °Saline nasal spray/drops °Sudafed (pseudoephedrine) & Actifed ** use only after [redacted] weeks gestation and if you do not have high blood pressure °Tylenol °Vicks Vaporub °Zinc lozenges °Zyrtec  ° °Constipation: °Colace °Ducolax suppositories °Fleet enema °Glycerin suppositories °Metamucil °Milk of magnesia °Miralax °Senokot °Smooth move tea ° °Diarrhea: °Kaopectate °Imodium A-D ° °*NO pepto Bismol ° °Hemorrhoids: °Anusol °Anusol HC °Preparation H °Tucks ° °Indigestion: °Tums °Maalox °Mylanta °Zantac  °Pepcid ° °Insomnia: °Benadryl (alcohol free) 25mg every 6 hours as needed °Tylenol  PM °Unisom, no Gelcaps ° °Leg Cramps: °Tums °MagGel ° °Nausea/Vomiting:  °Bonine °Dramamine °Emetrol °Ginger extract °Sea bands °Meclizine  °Nausea medication to take during pregnancy:  °Unisom (doxylamine succinate 25 mg tablets) Take one tablet daily at bedtime. If symptoms are not adequately controlled, the dose can be increased to a maximum recommended dose of two tablets daily (1/2 tablet in the morning, 1/2 tablet mid-afternoon and one at bedtime). °Vitamin B6 100mg tablets. Take one tablet twice a day (up to 200 mg per day). ° °Skin Rashes: °Aveeno products °Benadryl cream or 25mg every 6 hours as needed °Calamine Lotion °1% cortisone cream ° °Yeast infection: °Gyne-lotrimin 7 °Monistat 7 ° ° °**If taking multiple medications, please check labels to avoid duplicating the same active ingredients °**take medication as directed on the label °** Do not exceed 4000 mg of tylenol in 24 hours °**Do not take medications that contain aspirin or ibuprofen ° ° °First Trimester of Pregnancy °The first trimester of pregnancy is from week 1 until the end of week 12 (months 1 through 3). A week after a sperm fertilizes an egg, the egg will implant on the wall of the uterus. This embryo will   begin to develop into a baby. Genes from you and your partner are forming the baby. The female genes determine whether the baby is a boy or a girl. At 6-8 weeks, the eyes and face are formed, and the heartbeat can be seen on ultrasound. At the end of 12 weeks, all the baby's organs are formed.  °Now that you are pregnant, you will want to do everything you can to have a healthy baby. Two of the most important things are to get good prenatal care and to follow your health care provider's instructions. Prenatal care is all the medical care you receive before the baby's birth. This care will help prevent, find, and treat any problems during the pregnancy and childbirth. °BODY CHANGES °Your body goes through many changes during pregnancy. The  changes vary from woman to woman.  °· You may gain or lose a couple of pounds at first. °· You may feel sick to your stomach (nauseous) and throw up (vomit). If the vomiting is uncontrollable, call your health care provider. °· You may tire easily. °· You may develop headaches that can be relieved by medicines approved by your health care provider. °· You may urinate more often. Painful urination may mean you have a bladder infection. °· You may develop heartburn as a result of your pregnancy. °· You may develop constipation because certain hormones are causing the muscles that push waste through your intestines to slow down. °· You may develop hemorrhoids or swollen, bulging veins (varicose veins). °· Your breasts may begin to grow larger and become tender. Your nipples may stick out more, and the tissue that surrounds them (areola) may become darker. °· Your gums may bleed and may be sensitive to brushing and flossing. °· Dark spots or blotches (chloasma, mask of pregnancy) may develop on your face. This will likely fade after the baby is born. °· Your menstrual periods will stop. °· You may have a loss of appetite. °· You may develop cravings for certain kinds of food. °· You may have changes in your emotions from day to day, such as being excited to be pregnant or being concerned that something may go wrong with the pregnancy and baby. °· You may have more vivid and strange dreams. °· You may have changes in your hair. These can include thickening of your hair, rapid growth, and changes in texture. Some women also have hair loss during or after pregnancy, or hair that feels dry or thin. Your hair will most likely return to normal after your baby is born. °WHAT TO EXPECT AT YOUR PRENATAL VISITS °During a routine prenatal visit: °· You will be weighed to make sure you and the baby are growing normally. °· Your blood pressure will be taken. °· Your abdomen will be measured to track your baby's growth. °· The fetal  heartbeat will be listened to starting around week 10 or 12 of your pregnancy. °· Test results from any previous visits will be discussed. °Your health care provider may ask you: °· How you are feeling. °· If you are feeling the baby move. °· If you have had any abnormal symptoms, such as leaking fluid, bleeding, severe headaches, or abdominal cramping. °· If you have any questions. °Other tests that may be performed during your first trimester include: °· Blood tests to find your blood type and to check for the presence of any previous infections. They will also be used to check for low iron levels (anemia) and Rh antibodies. Later   in the pregnancy, blood tests for diabetes will be done along with other tests if problems develop. °· Urine tests to check for infections, diabetes, or protein in the urine. °· An ultrasound to confirm the proper growth and development of the baby. °· An amniocentesis to check for possible genetic problems. °· Fetal screens for spina bifida and Down syndrome. °· You may need other tests to make sure you and the baby are doing well. °HOME CARE INSTRUCTIONS  °Medicines °· Follow your health care provider's instructions regarding medicine use. Specific medicines may be either safe or unsafe to take during pregnancy. °· Take your prenatal vitamins as directed. °· If you develop constipation, try taking a stool softener if your health care provider approves. °Diet °· Eat regular, well-balanced meals. Choose a variety of foods, such as meat or vegetable-based protein, fish, milk and low-fat dairy products, vegetables, fruits, and whole grain breads and cereals. Your health care provider will help you determine the amount of weight gain that is right for you. °· Avoid raw meat and uncooked cheese. These carry germs that can cause birth defects in the baby. °· Eating four or five small meals rather than three large meals a day may help relieve nausea and vomiting. If you start to feel nauseous,  eating a few soda crackers can be helpful. Drinking liquids between meals instead of during meals also seems to help nausea and vomiting. °· If you develop constipation, eat more high-fiber foods, such as fresh vegetables or fruit and whole grains. Drink enough fluids to keep your urine clear or pale yellow. °Activity and Exercise °· Exercise only as directed by your health care provider. Exercising will help you: °¨ Control your weight. °¨ Stay in shape. °¨ Be prepared for labor and delivery. °· Experiencing pain or cramping in the lower abdomen or low back is a good sign that you should stop exercising. Check with your health care provider before continuing normal exercises. °· Try to avoid standing for long periods of time. Move your legs often if you must stand in one place for a long time. °· Avoid heavy lifting. °· Wear low-heeled shoes, and practice good posture. °· You may continue to have sex unless your health care provider directs you otherwise. °Relief of Pain or Discomfort °· Wear a good support bra for breast tenderness.   °· Take warm sitz baths to soothe any pain or discomfort caused by hemorrhoids. Use hemorrhoid cream if your health care provider approves.   °· Rest with your legs elevated if you have leg cramps or low back pain. °· If you develop varicose veins in your legs, wear support hose. Elevate your feet for 15 minutes, 3-4 times a day. Limit salt in your diet. °Prenatal Care °· Schedule your prenatal visits by the twelfth week of pregnancy. They are usually scheduled monthly at first, then more often in the last 2 months before delivery. °· Write down your questions. Take them to your prenatal visits. °· Keep all your prenatal visits as directed by your health care provider. °Safety °· Wear your seat belt at all times when driving. °· Make a list of emergency phone numbers, including numbers for family, friends, the hospital, and police and fire departments. °General Tips °· Ask your  health care provider for a referral to a local prenatal education class. Begin classes no later than at the beginning of month 6 of your pregnancy. °· Ask for help if you have counseling or nutritional needs during pregnancy. Your   health care provider can offer advice or refer you to specialists for help with various needs. °· Do not use hot tubs, steam rooms, or saunas. °· Do not douche or use tampons or scented sanitary pads. °· Do not cross your legs for long periods of time. °· Avoid cat litter boxes and soil used by cats. These carry germs that can cause birth defects in the baby and possibly loss of the fetus by miscarriage or stillbirth. °· Avoid all smoking, herbs, alcohol, and medicines not prescribed by your health care provider. Chemicals in these affect the formation and growth of the baby. °· Schedule a dentist appointment. At home, brush your teeth with a soft toothbrush and be gentle when you floss. °SEEK MEDICAL CARE IF:  °· You have dizziness. °· You have mild pelvic cramps, pelvic pressure, or nagging pain in the abdominal area. °· You have persistent nausea, vomiting, or diarrhea. °· You have a bad smelling vaginal discharge. °· You have pain with urination. °· You notice increased swelling in your face, hands, legs, or ankles. °SEEK IMMEDIATE MEDICAL CARE IF:  °· You have a fever. °· You are leaking fluid from your vagina. °· You have spotting or bleeding from your vagina. °· You have severe abdominal cramping or pain. °· You have rapid weight gain or loss. °· You vomit blood or material that looks like coffee grounds. °· You are exposed to German measles and have never had them. °· You are exposed to fifth disease or chickenpox. °· You develop a severe headache. °· You have shortness of breath. °· You have any kind of trauma, such as from a fall or a car accident. °Document Released: 11/08/2001 Document Revised: 03/31/2014 Document Reviewed: 09/24/2013 °ExitCare® Patient Information ©2015  [employer]. This information is not intended to replace advice given to you by your health care provider. Make sure you discuss any questions you have with your health care provider. ° °

## 2015-02-12 NOTE — MAU Provider Note (Signed)
  History     CSN: [id number]  Arrival date and time: 02/12/15 1403   None     No chief complaint on file.  HPI  Theresa Horne is a 24 y.o. G2P0101 at 6241w2d who presents today for FU US. She reports some occasional random cramping, and denies any vaginal bleeding.   Past Medical History  Diagnosis Date  . Hypertension   . Preterm labor     Past Surgical History  Procedure Laterality Date  . No past surgeries      Family History  Problem Relation Age of Onset  . Hypertension Father   . Diabetes Paternal Uncle   . Renal Disease Maternal Grandmother     History  Substance Use Topics  . Smoking status: Current Every Day Smoker -- 0.25 packs/day    Types: Cigarettes  . Smokeless tobacco: Never Used  . Alcohol Use: Yes    Allergies: No Known Allergies  No prescriptions prior to admission    ROS Physical Exam   Last menstrual period 12/30/2014.  Physical Exam  Constitutional: She appears well-developed and well-nourished. No distress.  Respiratory: Effort normal.  Skin: Skin is warm and dry.  Psychiatric: She has a normal mood and affect.    MAU Course  Procedures  [place] Ob Transvaginal  02/12/2015   CLINICAL DATA:  Assess viability. Pregnant patient with abdominal pain.  EXAM: TRANSVAGINAL OB ULTRASOUND  TECHNIQUE: Transvaginal ultrasound was performed for complete evaluation of the gestation as well as the maternal uterus, adnexal regions, and pelvic cul-de-sac.  COMPARISON:  01/30/2015  FINDINGS: Intrauterine gestational sac: Visualized/normal in shape.  Yolk sac:  Present  Embryo:  Present  Cardiac Activity: Present  Heart Rate: 121 bpm  CRL:   6.8  mm   6 w 4 d                  US EDC: 10/04/2015  Maternal uterus/adnexae: Slight interval decrease in fluid within the right adnexa. Right and left ovaries are unremarkable. Small subchorionic hemorrhage.  IMPRESSION: Single live intrauterine gestation 6 weeks 2 days by LMP.  Small subchorionic hemorrhage.   Slight interval decrease in fluid within the right adnexa, potentially representing hydrosalpinx. Attention on followup is recommended.   Electronically Signed   By: Annia Beltrew  Davis M.D.   On: 02/12/2015 14:02     Assessment and Plan   1. Encounter to discuss test results    DC home  First trimester precatuions [facility] as needed [facility] Pregnancy verification letter given  Follow-up Information    [facility]   Contact information:   [address] [place] [id number] [phone]       Tawnya CrookHogan, Crispin Vogel Donovan 02/12/2015, 2:09 PM

## 2015-02-17 LAB — OB RESULTS CONSOLE ANTIBODY SCREEN: ANTIBODY SCREEN: NEGATIVE

## 2015-02-17 LAB — OB RESULTS CONSOLE RUBELLA ANTIBODY, IGM: Rubella: NON-IMMUNE/NOT IMMUNE

## 2015-02-17 LAB — OB RESULTS CONSOLE ABO/RH: RH Type: POSITIVE

## 2015-02-17 LAB — OB RESULTS CONSOLE HEPATITIS B SURFACE ANTIGEN: Hepatitis B Surface Ag: NEGATIVE

## 2015-02-17 LAB — OB RESULTS CONSOLE GC/CHLAMYDIA: Gonorrhea: NEGATIVE

## 2015-02-17 LAB — OB RESULTS CONSOLE RPR: RPR: NONREACTIVE

## 2015-03-12 DIAGNOSIS — B9689 Other specified bacterial agents as the cause of diseases classified elsewhere: Secondary | ICD-10-CM | POA: Insufficient documentation

## 2015-05-23 ENCOUNTER — Inpatient Hospital Stay (HOSPITAL_COMMUNITY)
Admission: AD | Admit: 2015-05-23 | Discharge: 2015-05-23 | Disposition: A | Discharge status: Home / Self Care | Payer: Medicaid Other | Source: Ambulatory Visit | Attending: Obstetrics and Gynecology | Admitting: Obstetrics and Gynecology

## 2015-05-23 ENCOUNTER — Encounter (HOSPITAL_COMMUNITY): Payer: Self-pay

## 2015-05-23 DIAGNOSIS — Z3A20 20 weeks gestation of pregnancy: Secondary | ICD-10-CM | POA: Insufficient documentation

## 2015-05-23 DIAGNOSIS — Z3493 Encounter for supervision of normal pregnancy, unspecified, third trimester: Secondary | ICD-10-CM | POA: Insufficient documentation

## 2015-05-23 NOTE — MAU Provider Note (Signed)
History    Theresa Horne is a 24y.o. G2P0010 at 21wks who presents, unannounced, with report of decreased fetal movement.  Patient states she has not experienced fetal movement all day and became concerned after FOB could not stimulate movement.  Patient reports "she always moves when he talks to her."  Patient reports adequate nutrition and hydration.  Patient denies contractions, LOF, and VB.    Patient Active Problem List   Diagnosis Date Noted  . Hydradenitis 04/16/2012    No chief complaint on file.  HPI  OB History    Gravida Para Term Preterm AB TAB SAB Ectopic Multiple Living   2 1 0 1 0     2      Past Medical History  Diagnosis Date  . Hypertension   . Preterm labor     Past Surgical History  Procedure Laterality Date  . No past surgeries      Family History  Problem Relation Age of Onset  . Hypertension Father   . Diabetes Paternal Uncle   . Renal Disease Maternal Grandmother     History  Substance Use Topics  . Smoking status: Former Smoker -- 0.25 packs/day    Types: Cigarettes  . Smokeless tobacco: Never Used  . Alcohol Use: Yes     Comment: social use before preg    Allergies: No Known Allergies  Prescriptions prior to admission  Medication Sig Dispense Refill Last Dose  . Prenatal Vit-Fe Fumarate-FA (PRENATAL MULTIVITAMIN) TABS tablet Take 1 tablet by mouth daily at 12 noon.   05/22/2015 at Unknown time    ROS  See HPI Above Physical Exam   Blood pressure 132/79, pulse 80, temperature 98 F (36.7 C), temperature source Oral, resp. rate 18, weight 97.24 kg (214 lb 6 oz), last menstrual period 12/30/2014.  No results found for this or any previous visit (from the past 24 hour(s)).  Physical Exam  Vitals reviewed. Constitutional: She is oriented to person, place, and time. She appears well-developed and well-nourished.  HENT:  Head: Normocephalic and atraumatic.  Eyes: EOM are normal.  Neck: Normal range of motion.  Cardiovascular:  Normal rate, regular rhythm and normal heart sounds.   Respiratory: Effort normal and breath sounds normal.  GI: Soft. Bowel sounds are normal.  Musculoskeletal: Normal range of motion. She exhibits no edema.  Neurological: She is alert and oriented to person, place, and time.  Skin: Skin is warm and dry.     FHR:135 bpm by doppler UC: None graphed ED Course  Assessment: IUP at 21wks Decreased FM  Plan: -FHT heard -Educated on fetal movement, growth, and development -Educated on anterior placement and effects of perception of fetal movement -PTL and Bleeding Precautions given -Encouraged to call if any questions or concerns arise prior to next scheduled office visit.  -Discussed who and when to call for pregnancy related concerns -Keep appt as scheduled --Discharged to home in good condition  Everett Ehrler LYNN CNM, MSN 05/23/2015 5:02 AM

## 2015-05-23 NOTE — Discharge Instructions (Signed)
Second Trimester of Pregnancy The second trimester is from week 13 through week 28, month 4 through 6. This is often the time in pregnancy that you feel your best. Often times, morning sickness has lessened or quit. You may have more energy, and you may get hungry more often. Your unborn baby (fetus) is growing rapidly. At the end of the sixth month, he or she is about 9 inches long and weighs about 1 pounds. You will likely feel the baby move (quickening) between 18 and 20 weeks of pregnancy. HOME CARE   Avoid all smoking, herbs, and alcohol. Avoid drugs not approved by your doctor.  Only take medicine as told by your doctor. Some medicines are safe and some are not during pregnancy.  Exercise only as told by your doctor. Stop exercising if you start having cramps.  Eat regular, healthy meals.  Wear a good support bra if your breasts are tender.  Do not use hot tubs, steam rooms, or saunas.  Wear your seat belt when driving.  Avoid raw meat, uncooked cheese, and liter boxes and soil used by cats.  Take your prenatal vitamins.  Try taking medicine that helps you poop (stool softener) as needed, and if your doctor approves. Eat more fiber by eating fresh fruit, vegetables, and whole grains. Drink enough fluids to keep your pee (urine) clear or pale yellow.  Take warm water baths (sitz baths) to soothe pain or discomfort caused by hemorrhoids. Use hemorrhoid cream if your doctor approves.  If you have puffy, bulging veins (varicose veins), wear support hose. Raise (elevate) your feet for 15 minutes, 3-4 times a day. Limit salt in your diet.  Avoid heavy lifting, wear low heals, and sit up straight.  Rest with your legs raised if you have leg cramps or low back pain.  Visit your dentist if you have not gone during your pregnancy. Use a soft toothbrush to brush your teeth. Be gentle when you floss.  You can have sex (intercourse) unless your doctor tells you not to.  Go to your  doctor visits. GET HELP IF:   You feel dizzy.  You have mild cramps or pressure in your lower belly (abdomen).  You have a nagging pain in your belly area.  You continue to feel sick to your stomach (nauseous), throw up (vomit), or have watery poop (diarrhea).  You have bad smelling fluid coming from your vagina.  You have pain with peeing (urination). GET HELP RIGHT AWAY IF:   You have a fever.  You are leaking fluid from your vagina.  You have spotting or bleeding from your vagina.  You have severe belly cramping or pain.  You lose or gain weight rapidly.  You have trouble catching your breath and have chest pain.  You notice sudden or extreme puffiness (swelling) of your face, hands, ankles, feet, or legs.  You have not felt the baby move in over an hour.  You have severe headaches that do not go away with medicine.  You have vision changes. Document Released: 02/08/2010 Document Revised: 03/11/2013 Document Reviewed: 01/15/2013 ExitCare Patient Information 2015 [employer]. This information is not intended to replace advice given to you by your health care provider. Make sure you discuss any questions you have with your health care provider.  

## 2015-05-23 NOTE — MAU Note (Signed)
Hasn't felt baby move since Friday morning.  No leaking No bleeding.

## 2015-06-08 ENCOUNTER — Inpatient Hospital Stay (HOSPITAL_COMMUNITY)
Admission: AD | Admit: 2015-06-08 | Discharge: 2015-06-09 | Disposition: A | Discharge status: Home / Self Care | Payer: PRIVATE HEALTH INSURANCE | Source: Ambulatory Visit | Attending: Obstetrics and Gynecology | Admitting: Obstetrics and Gynecology

## 2015-06-08 DIAGNOSIS — R109 Unspecified abdominal pain: Secondary | ICD-10-CM | POA: Diagnosis present

## 2015-06-08 DIAGNOSIS — Z87891 Personal history of nicotine dependence: Secondary | ICD-10-CM | POA: Diagnosis not present

## 2015-06-08 DIAGNOSIS — R319 Hematuria, unspecified: Secondary | ICD-10-CM | POA: Insufficient documentation

## 2015-06-08 DIAGNOSIS — O9989 Other specified diseases and conditions complicating pregnancy, childbirth and the puerperium: Secondary | ICD-10-CM | POA: Diagnosis not present

## 2015-06-08 DIAGNOSIS — Z3A23 23 weeks gestation of pregnancy: Secondary | ICD-10-CM | POA: Diagnosis not present

## 2015-06-08 DIAGNOSIS — O09899 Supervision of other high risk pregnancies, unspecified trimester: Secondary | ICD-10-CM

## 2015-06-08 DIAGNOSIS — O09219 Supervision of pregnancy with history of pre-term labor, unspecified trimester: Secondary | ICD-10-CM

## 2015-06-08 NOTE — MAU Note (Signed)
Pt reports pressure in the bottom of her stomach since yesterday. Denies bleeding. Denies dysuria.

## 2015-06-09 ENCOUNTER — Encounter (HOSPITAL_COMMUNITY): Payer: Self-pay

## 2015-06-09 DIAGNOSIS — O09899 Supervision of other high risk pregnancies, unspecified trimester: Secondary | ICD-10-CM

## 2015-06-09 DIAGNOSIS — O09219 Supervision of pregnancy with history of pre-term labor, unspecified trimester: Secondary | ICD-10-CM

## 2015-06-09 DIAGNOSIS — R109 Unspecified abdominal pain: Secondary | ICD-10-CM

## 2015-06-09 DIAGNOSIS — O9989 Other specified diseases and conditions complicating pregnancy, childbirth and the puerperium: Secondary | ICD-10-CM | POA: Diagnosis not present

## 2015-06-09 LAB — URINALYSIS, ROUTINE W REFLEX MICROSCOPIC
BILIRUBIN URINE: NEGATIVE
GLUCOSE, UA: NEGATIVE mg/dL
Ketones, ur: NEGATIVE mg/dL
LEUKOCYTES UA: NEGATIVE
NITRITE: NEGATIVE
PH: 6 (ref 5.0–8.0)
PROTEIN: NEGATIVE mg/dL
Urobilinogen, UA: 0.2 mg/dL (ref 0.0–1.0)

## 2015-06-09 LAB — URINE MICROSCOPIC-ADD ON

## 2015-06-09 LAB — WET PREP, GENITAL
CLUE CELLS WET PREP: NONE SEEN
Trich, Wet Prep: NONE SEEN
Yeast Wet Prep HPF POC: NONE SEEN

## 2015-06-09 NOTE — MAU Provider Note (Signed)
History  24 yo [id number] @ 23.2 wks presents to MAU unannounced w/ c/o pressure in the bottom of her stomach x 1 day. +FM. Denies bleeding, leaking or ctxs.  Patient Active Problem List   Diagnosis Date Noted  . Abdominal pressure 06/09/2015  . History of preterm delivery, currently pregnant -- delivered at 25 wks in March 2012 06/09/2015  . Hydradenitis 04/16/2012    Chief Complaint  Patient presents with  . Abdominal Pain    pressure   HPI As above OB History    Gravida Para Term Preterm AB TAB SAB Ectopic Multiple Living   3 1 0 1 1 1    1       Past Medical History  Diagnosis Date  . Hypertension   . Preterm labor     Past Surgical History  Procedure Laterality Date  . No past surgeries      Family History  Problem Relation Age of Onset  . Hypertension Father   . Diabetes Paternal Uncle   . Renal Disease Maternal Grandmother     History  Substance Use Topics  . Smoking status: Former Smoker -- 0.25 packs/day    Types: Cigarettes  . Smokeless tobacco: Never Used  . Alcohol Use: Yes     Comment: social use before preg    Allergies: No Known Allergies  No prescriptions prior to admission    ROS  +FM Lower abdominal pressure Physical Exam   Blood pressure 124/69, pulse 83, temperature 98.3 F (36.8 C), temperature source Oral, resp. rate 18, height 5\' 4"  (1.626 m), weight 99.338 kg (219 lb), last menstrual period 12/30/2014, SpO2 97 %.    Physical Exam Gen: NAD Abdomen: soft, NT, no guarding or rebound Neg CVAT bilaterally NEFG Blind sample collected for wet prep  Pelvic: Closed/thick/high FHRT: Reassuring for this GA Ctxs: None ED Course  Assessment: Common discomforts of pregnancy  Plan: Await wet prep results UA sent for culture   Sherre ScarletWILLIAMS, Johonna Binette CNM, MS 06/09/2015 6:14 PM   ADDENDUM: Results for orders placed or performed during the hospital encounter of 06/08/15 (from the past 24 hour(s))  Urinalysis, Routine w reflex  microscopic (not at [facility])     Status: Abnormal   Collection Time: 06/08/15 11:56 PM  Result Value Ref Range   Color, Urine YELLOW YELLOW   APPearance CLEAR CLEAR   Specific Gravity, Urine >1.030 (H) 1.005 - 1.030   pH 6.0 5.0 - 8.0   Glucose, UA NEGATIVE NEGATIVE mg/dL   Hgb urine dipstick MODERATE (A) NEGATIVE   Bilirubin Urine NEGATIVE NEGATIVE   Ketones, ur NEGATIVE NEGATIVE mg/dL   Protein, ur NEGATIVE NEGATIVE mg/dL   Urobilinogen, UA 0.2 0.0 - 1.0 mg/dL   Nitrite NEGATIVE NEGATIVE   Leukocytes, UA NEGATIVE NEGATIVE  Urine microscopic-add on     Status: Abnormal   Collection Time: 06/08/15 11:56 PM  Result Value Ref Range   Squamous Epithelial / LPF FEW (A) RARE   WBC, UA 0-2 <3 WBC/hpf   RBC / HPF 7-10 <3 RBC/hpf   Bacteria, UA FEW (A) RARE   Crystals CA OXALATE CRYSTALS (A) NEGATIVE   Urine-Other MUCOUS PRESENT   Wet prep, genital     Status: Abnormal   Collection Time: 06/09/15 12:35 AM  Result Value Ref Range   Yeast Wet Prep HPF POC NONE SEEN NONE SEEN   Trich, Wet Prep NONE SEEN NONE SEEN   Clue Cells Wet Prep HPF POC NONE SEEN NONE SEEN   WBC, Wet Prep  HPF POC FEW (A) NONE SEEN   A: Hematuria -- sample sent for culture. Wet prep neg. Reassuring FHRT; no contractions  P: Reassurance  Office today for 17P injection OB f/u on 06/15/15 as scheduled Strict PTL precautions Call w/ questions or concerns   Sherre Scarlet, CNM 06/09/15, 1:51 AM

## 2015-06-10 LAB — CULTURE, OB URINE

## 2015-07-10 ENCOUNTER — Encounter (HOSPITAL_COMMUNITY): Payer: Self-pay | Admitting: *Deleted

## 2015-07-10 ENCOUNTER — Inpatient Hospital Stay (HOSPITAL_COMMUNITY)
Admission: AD | Admit: 2015-07-10 | Discharge: 2015-07-10 | Disposition: A | Discharge status: Home / Self Care | Payer: Medicaid Other | Source: Ambulatory Visit | Attending: Obstetrics and Gynecology | Admitting: Obstetrics and Gynecology

## 2015-07-10 DIAGNOSIS — Z87891 Personal history of nicotine dependence: Secondary | ICD-10-CM | POA: Insufficient documentation

## 2015-07-10 DIAGNOSIS — Z3A27 27 weeks gestation of pregnancy: Secondary | ICD-10-CM | POA: Insufficient documentation

## 2015-07-10 DIAGNOSIS — O4692 Antepartum hemorrhage, unspecified, second trimester: Secondary | ICD-10-CM | POA: Diagnosis not present

## 2015-07-10 DIAGNOSIS — N760 Acute vaginitis: Secondary | ICD-10-CM

## 2015-07-10 DIAGNOSIS — B9689 Other specified bacterial agents as the cause of diseases classified elsewhere: Secondary | ICD-10-CM

## 2015-07-10 LAB — URINE MICROSCOPIC-ADD ON

## 2015-07-10 LAB — GC/CHLAMYDIA PROBE AMP (~~LOC~~) NOT AT ARMC
CHLAMYDIA, DNA PROBE: NEGATIVE
NEISSERIA GONORRHEA: NEGATIVE

## 2015-07-10 LAB — URINALYSIS, ROUTINE W REFLEX MICROSCOPIC
BILIRUBIN URINE: NEGATIVE
Glucose, UA: NEGATIVE mg/dL
KETONES UR: NEGATIVE mg/dL
LEUKOCYTES UA: NEGATIVE
NITRITE: NEGATIVE
PH: 6 (ref 5.0–8.0)
PROTEIN: NEGATIVE mg/dL
Specific Gravity, Urine: 1.025 (ref 1.005–1.030)
UROBILINOGEN UA: 0.2 mg/dL (ref 0.0–1.0)

## 2015-07-10 LAB — WET PREP, GENITAL
TRICH WET PREP: NONE SEEN
Yeast Wet Prep HPF POC: NONE SEEN

## 2015-07-10 MED ORDER — METRONIDAZOLE 500 MG PO TABS: 500.0000 mg | ORAL_TABLET | Freq: Two times a day (BID) | ORAL | Status: DC

## 2015-07-10 NOTE — Discharge Instructions (Signed)
Bacterial Vaginosis °Bacterial vaginosis is a vaginal infection that occurs when the normal balance of bacteria in the vagina is disrupted. It results from an overgrowth of certain bacteria. This is the most common vaginal infection in women of childbearing age. Treatment is important to prevent complications, especially in pregnant women, as it can cause a premature delivery. °CAUSES  °Bacterial vaginosis is caused by an increase in harmful bacteria that are normally present in smaller amounts in the vagina. Several different kinds of bacteria can cause bacterial vaginosis. However, the reason that the condition develops is not fully understood. °RISK FACTORS °Certain activities or behaviors can put you at an increased risk of developing bacterial vaginosis, including: °· Having a new sex partner or multiple sex partners. °· Douching. °· Using an intrauterine device (IUD) for contraception. °Women do not get bacterial vaginosis from toilet seats, bedding, swimming pools, or contact with objects around them. °SIGNS AND SYMPTOMS  °Some women with bacterial vaginosis have no signs or symptoms. Common symptoms include: °· Grey vaginal discharge. °· A fishlike odor with discharge, especially after sexual intercourse. °· Itching or burning of the vagina and vulva. °· Burning or pain with urination. °DIAGNOSIS  °Your health care provider will take a medical history and examine the vagina for signs of bacterial vaginosis. A sample of vaginal fluid may be taken. Your health care provider will look at this sample under a microscope to check for bacteria and abnormal cells. A vaginal pH test may also be done.  °TREATMENT  °Bacterial vaginosis may be treated with antibiotic medicines. These may be given in the form of a pill or a vaginal cream. A second round of antibiotics may be prescribed if the condition comes back after treatment.  °HOME CARE INSTRUCTIONS  °· Only take over-the-counter or prescription medicines as  directed by your health care provider. °· If antibiotic medicine was prescribed, take it as directed. Make sure you finish it even if you start to feel better. °· Do not have sex until treatment is completed. °· Tell all sexual partners that you have a vaginal infection. They should see their health care provider and be treated if they have problems, such as a mild rash or itching. °· Practice safe sex by using condoms and only having one sex partner. °SEEK MEDICAL CARE IF:  °· Your symptoms are not improving after 3 days of treatment. °· You have increased discharge or pain. °· You have a fever. °MAKE SURE YOU:  °· Understand these instructions. °· Will watch your condition. °· Will get help right away if you are not doing well or get worse. °FOR MORE INFORMATION  °Centers for Disease Control and Prevention, Division of STD Prevention: www.cdc.gov/std °American Sexual Health Association (ASHA): www.ashastd.org  °Document Released: 11/14/2005 Document Revised: 09/04/2013 Document Reviewed: 06/26/2013 °ExitCare® Patient Information ©2015 [employer]. This information is not intended to replace advice given to you by your health care provider. Make sure you discuss any questions you have with your health care provider. ° ° °Vaginal Bleeding During Pregnancy, Second Trimester °A small amount of bleeding (spotting) from the vagina is relatively common in pregnancy. It usually stops on its own. Various things can cause bleeding or spotting in pregnancy. Some bleeding may be related to the pregnancy, and some may not. Sometimes the bleeding is normal and is not a problem. However, bleeding can also be a sign of something serious. Be sure to tell your health care provider about any vaginal bleeding right away. °Some possible   causes of vaginal bleeding during the second trimester include: °· Infection, inflammation, or growths on the cervix.   °· The placenta may be partially or completely covering the opening of the  cervix inside the uterus (placenta previa). °· The placenta may have separated from the uterus (abruption of the placenta).   °· You may be having early (preterm) labor.   °· The cervix may not be strong enough to keep a baby inside the uterus (cervical insufficiency).   °· Tiny cysts may have developed in the uterus instead of pregnancy tissue (molar pregnancy).  °HOME CARE INSTRUCTIONS  °Watch your condition for any changes. The following actions may help to lessen any discomfort you are feeling: °· Follow your health care provider's instructions for limiting your activity. If your health care provider orders bed rest, you may need to stay in bed and only get up to use the bathroom. However, your health care provider may allow you to continue light activity. °· If needed, make plans for someone to help with your regular activities and responsibilities while you are on bed rest. °· Keep track of the number of pads you use each day, how often you change pads, and how soaked (saturated) they are. Write this down. °· Do not use tampons. Do not douche. °· Do not have sexual intercourse or orgasms until approved by your health care provider. °· If you pass any tissue from your vagina, save the tissue so you can show it to your health care provider. °· Only take over-the-counter or prescription medicines as directed by your health care provider. °· Do not take aspirin because it can make you bleed. °· Do not exercise or perform any strenuous activities or heavy lifting without your health care provider's permission. °· Keep all follow-up appointments as directed by your health care provider. °SEEK MEDICAL CARE IF: °· You have any vaginal bleeding during any part of your pregnancy. °· You have cramps or labor pains. °· You have a fever, not controlled by medicine. °SEEK IMMEDIATE MEDICAL CARE IF:  °· You have severe cramps in your back or belly (abdomen). °· You have contractions. °· You have chills. °· You pass large  clots or tissue from your vagina. °· Your bleeding increases. °· You feel light-headed or weak, or you have fainting episodes. °· You are leaking fluid or have a gush of fluid from your vagina. °MAKE SURE YOU: °· Understand these instructions. °· Will watch your condition. °· Will get help right away if you are not doing well or get worse. °Document Released: 08/24/2005 Document Revised: 11/19/2013 Document Reviewed: 07/22/2013 °ExitCare® Patient Information ©2015 [employer]. This information is not intended to replace advice given to you by your health care provider. Make sure you discuss any questions you have with your health care provider. ° °

## 2015-07-10 NOTE — MAU Provider Note (Signed)
History    Theresa Horne is a 24 y.o. [phone] at 27.5wks who presents, unannounced, for vaginal bleeding.  Patient states she had the equivalent of a quarter size amt of light red blood after going to the bathroom.  Patient states she wiped twice and noted the same amount of blood. However, patient states that no blood was noted upon arrival.  Patient denies problems with urination, constipation, or diarrhea.  Patient reports active fetus and denies LOF, contractions, cramping, or back pain.  Patient denies sexual activity in the past 48 hours.   Patient Active Problem List   Diagnosis Date Noted  . Abdominal pressure 06/09/2015  . History of preterm delivery, currently pregnant -- delivered at 25 wks in March 2012 06/09/2015  . Hydradenitis 04/16/2012    Chief Complaint  Patient presents with  . Vaginal Bleeding   HPI  OB History    Gravida Para Term Preterm AB TAB SAB Ectopic Multiple Living   3 1 0 1 1 1    1       Past Medical History  Diagnosis Date  . Hypertension   . Preterm labor     Past Surgical History  Procedure Laterality Date  . No past surgeries      Family History  Problem Relation Age of Onset  . Hypertension Father   . Diabetes Paternal Uncle   . Renal Disease Maternal Grandmother     Social History  Substance Use Topics  . Smoking status: Former Smoker -- 0.25 packs/day    Types: Cigarettes  . Smokeless tobacco: Never Used  . Alcohol Use: Yes     Comment: social use before preg    Allergies: No Known Allergies  Prescriptions prior to admission  Medication Sig Dispense Refill Last Dose  . hydroxyprogesterone caproate (DELALUTIN) 250 mg/mL OIL injection Inject 250 mg into the muscle once.   Past Week at Unknown time  . Prenatal Vit-Fe Fumarate-FA (PRENATAL MULTIVITAMIN) TABS tablet Take 1 tablet by mouth daily at 12 noon.   07/09/2015 at Unknown time    ROS  See HPI Above Physical Exam   Blood pressure 112/65, pulse 78, temperature 98.2  F (36.8 C), resp. rate 18, last menstrual period 12/30/2014.  Results for orders placed or performed during the hospital encounter of 07/10/15 (from the past 24 hour(s))  Urinalysis, Routine w reflex microscopic (not at [facility])     Status: Abnormal   Collection Time: 07/10/15  1:00 AM  Result Value Ref Range   Color, Urine YELLOW YELLOW   APPearance CLEAR CLEAR   Specific Gravity, Urine 1.025 1.005 - 1.030   pH 6.0 5.0 - 8.0   Glucose, UA NEGATIVE NEGATIVE mg/dL   Hgb urine dipstick SMALL (A) NEGATIVE   Bilirubin Urine NEGATIVE NEGATIVE   Ketones, ur NEGATIVE NEGATIVE mg/dL   Protein, ur NEGATIVE NEGATIVE mg/dL   Urobilinogen, UA 0.2 0.0 - 1.0 mg/dL   Nitrite NEGATIVE NEGATIVE   Leukocytes, UA NEGATIVE NEGATIVE  Urine microscopic-add on     Status: Abnormal   Collection Time: 07/10/15  1:00 AM  Result Value Ref Range   Squamous Epithelial / LPF RARE RARE   WBC, UA 0-2 <3 WBC/hpf   RBC / HPF 3-6 <3 RBC/hpf   Bacteria, UA FEW (A) RARE    Physical Exam  Constitutional: She is oriented to person, place, and time. She appears well-developed and well-nourished. No distress.  HENT:  Head: Normocephalic and atraumatic.  Eyes: Pupils are equal, round, and reactive to  light.  Neck: Normal range of motion.  Cardiovascular: Normal rate.   Respiratory: Effort normal.  GI: Soft.  Genitourinary: Cervix exhibits discharge. Cervix exhibits no motion tenderness and no friability. No bleeding in the vagina. No vaginal discharge found.   Speculum Exam: -Vulva: Dime sized blister noted on right labia majora near thigh.  Appears drained.  No s/s of infection. Inner aspect white and redness noted on borders. Bilateral labia majora appear slightly inflamed. -Vaginal Vault: Scant amt brownish discharge -wet prep collected -Cervix: Small amt of yellowish discharge from os-GC/CT collected Bimanual Exam: Closed/Long/Thick/Ballotable  Musculoskeletal: Normal range of motion. She exhibits no edema.   Neurological: She is alert and oriented to person, place, and time.  Skin: Skin is warm and dry.     FHR: 125 bpm, Mod Var, -Decels, +Accels UC: None graphed or palpated ED Course  Assessment: IUP at 27.5wks Cat I FT Vaginal Bleeding  Plan: -PE as above -Wet prep, UA, GC/Ct -Discussed VB in pregnancy; spotting vs vb, who and when to call for concerns -PTL precautions discussed -Discussed need for lifting and weight bearing restrictions due to HR status  Follow Up (0228) -Wet Prep; +Clue Cells -Rx for Flagyl  BID x 7 days -Vaginal hygiene -Keep appt as scheduled -Encouraged to call if any questions or concerns arise prior to next scheduled office visit.  -Discharged to home in stable condition  Shadee Montoya LYNN CNM, MSN 07/10/2015 1:29 AM

## 2015-07-10 NOTE — MAU Note (Signed)
Pt reports red blood with wiping at MN tonight. Pt denies pain

## 2015-07-11 LAB — CULTURE, OB URINE

## 2015-09-07 LAB — OB RESULTS CONSOLE GBS: GBS: POSITIVE

## 2015-09-09 ENCOUNTER — Inpatient Hospital Stay (HOSPITAL_COMMUNITY)
Admission: AD | Admit: 2015-09-09 | Discharge: 2015-09-09 | Disposition: A | Discharge status: Home / Self Care | Payer: Medicaid Other | Source: Ambulatory Visit | Attending: Obstetrics and Gynecology | Admitting: Obstetrics and Gynecology

## 2015-09-09 ENCOUNTER — Encounter (HOSPITAL_COMMUNITY): Payer: Self-pay

## 2015-09-09 DIAGNOSIS — Z3A36 36 weeks gestation of pregnancy: Secondary | ICD-10-CM | POA: Insufficient documentation

## 2015-09-09 DIAGNOSIS — Z87891 Personal history of nicotine dependence: Secondary | ICD-10-CM | POA: Diagnosis not present

## 2015-09-09 MED ORDER — ZOLPIDEM TARTRATE 5 MG PO TABS
ORAL_TABLET | ORAL | Status: AC
  Administered 2015-09-09: 5 mg via ORAL
  Filled 2015-09-09: qty 1

## 2015-09-09 MED ORDER — MORPHINE SULFATE (PF) 4 MG/ML IV SOLN
INTRAVENOUS | Status: AC
  Administered 2015-09-09: 4 mg via INTRAMUSCULAR
  Filled 2015-09-09: qty 1

## 2015-09-09 MED ORDER — NIFEDIPINE 10 MG PO CAPS
ORAL_CAPSULE | ORAL | Status: AC
  Administered 2015-09-09: 02:00:00
  Filled 2015-09-09: qty 1

## 2015-09-09 MED ORDER — NIFEDIPINE 10 MG PO CAPS
ORAL_CAPSULE | ORAL | Status: AC
  Administered 2015-09-09: 05:00:00
  Filled 2015-09-09: qty 1

## 2015-09-09 NOTE — Discharge Instructions (Signed)
Third Trimester of Pregnancy °The third trimester is from week 29 through week 42, months 7 through 9. The third trimester is a time when the fetus is growing rapidly. At the end of the ninth month, the fetus is about 20 inches in length and weighs 6-10 pounds.  °BODY CHANGES °Your body goes through many changes during pregnancy. The changes vary from woman to woman.  °· Your weight will continue to increase. You can expect to gain 25-35 pounds (11-16 kg) by the end of the pregnancy. °· You may begin to get stretch marks on your hips, abdomen, and breasts. °· You may urinate more often because the fetus is moving lower into your pelvis and pressing on your bladder. °· You may develop or continue to have heartburn as a result of your pregnancy. °· You may develop constipation because certain hormones are causing the muscles that push waste through your intestines to slow down. °· You may develop hemorrhoids or swollen, bulging veins (varicose veins). °· You may have pelvic pain because of the weight gain and pregnancy hormones relaxing your joints between the bones in your pelvis. Backaches may result from overexertion of the muscles supporting your posture. °· You may have changes in your hair. These can include thickening of your hair, rapid growth, and changes in texture. Some women also have hair loss during or after pregnancy, or hair that feels dry or thin. Your hair will most likely return to normal after your baby is born. °· Your breasts will continue to grow and be tender. A yellow discharge may leak from your breasts called colostrum. °· Your belly button may stick out. °· You may feel short of breath because of your expanding uterus. °· You may notice the fetus "dropping," or moving lower in your abdomen. °· You may have a bloody mucus discharge. This usually occurs a few days to a week before labor begins. °· Your cervix becomes thin and soft (effaced) near your due date. °WHAT TO EXPECT AT YOUR PRENATAL  EXAMS  °You will have prenatal exams every 2 weeks until week 36. Then, you will have weekly prenatal exams. During a routine prenatal visit: °· You will be weighed to make sure you and the fetus are growing normally. °· Your blood pressure is taken. °· Your abdomen will be measured to track your baby's growth. °· The fetal heartbeat will be listened to. °· Any test results from the previous visit will be discussed. °· You may have a cervical check near your due date to see if you have effaced. °At around 36 weeks, your caregiver will check your cervix. At the same time, your caregiver will also perform a test on the secretions of the vaginal tissue. This test is to determine if a type of bacteria, Group B streptococcus, is present. Your caregiver will explain this further. °Your caregiver may ask you: °· What your birth plan is. °· How you are feeling. °· If you are feeling the baby move. °· If you have had any abnormal symptoms, such as leaking fluid, bleeding, severe headaches, or abdominal cramping. °· If you are using any tobacco products, including cigarettes, chewing tobacco, and electronic cigarettes. °· If you have any questions. °Other tests or screenings that may be performed during your third trimester include: °· Blood tests that check for low iron levels (anemia). °· Fetal testing to check the health, activity level, and growth of the fetus. Testing is done if you have certain medical conditions or if   there are problems during the pregnancy. °· HIV (human immunodeficiency virus) testing. If you are at high risk, you may be screened for HIV during your third trimester of pregnancy. °FALSE LABOR °You may feel small, irregular contractions that eventually go away. These are called Braxton Hicks contractions, or false labor. Contractions may last for hours, days, or even weeks before true labor sets in. If contractions come at regular intervals, intensify, or become painful, it is best to be seen by your  caregiver.  °SIGNS OF LABOR  °· Menstrual-like cramps. °· Contractions that are 5 minutes apart or less. °· Contractions that start on the top of the uterus and spread down to the lower abdomen and back. °· A sense of increased pelvic pressure or back pain. °· A watery or bloody mucus discharge that comes from the vagina. °If you have any of these signs before the 37th week of pregnancy, call your caregiver right away. You need to go to the hospital to get checked immediately. °HOME CARE INSTRUCTIONS  °· Avoid all smoking, herbs, alcohol, and unprescribed drugs. These chemicals affect the formation and growth of the baby. °· Do not use any tobacco products, including cigarettes, chewing tobacco, and electronic cigarettes. If you need help quitting, ask your health care provider. You may receive counseling support and other resources to help you quit. °· Follow your caregiver's instructions regarding medicine use. There are medicines that are either safe or unsafe to take during pregnancy. °· Exercise only as directed by your caregiver. Experiencing uterine cramps is a good sign to stop exercising. °· Continue to eat regular, healthy meals. °· Wear a good support bra for breast tenderness. °· Do not use hot tubs, steam rooms, or saunas. °· Wear your seat belt at all times when driving. °· Avoid raw meat, uncooked cheese, cat litter boxes, and soil used by cats. These carry germs that can cause birth defects in the baby. °· Take your prenatal vitamins. °· Take 1500-2000 mg of calcium daily starting at the 20th week of pregnancy until you deliver your baby. °· Try taking a stool softener (if your caregiver approves) if you develop constipation. Eat more high-fiber foods, such as fresh vegetables or fruit and whole grains. Drink plenty of fluids to keep your urine clear or pale yellow. °· Take warm sitz baths to soothe any pain or discomfort caused by hemorrhoids. Use hemorrhoid cream if your caregiver approves. °· If  you develop varicose veins, wear support hose. Elevate your feet for 15 minutes, 3-4 times a day. Limit salt in your diet. °· Avoid heavy lifting, wear low heal shoes, and practice good posture. °· Rest a lot with your legs elevated if you have leg cramps or low back pain. °· Visit your dentist if you have not gone during your pregnancy. Use a soft toothbrush to brush your teeth and be gentle when you floss. °· A sexual relationship may be continued unless your caregiver directs you otherwise. °· Do not travel far distances unless it is absolutely necessary and only with the approval of your caregiver. °· Take prenatal classes to understand, practice, and ask questions about the labor and delivery. °· Make a trial run to the hospital. °· Pack your hospital bag. °· Prepare the baby's nursery. °· Continue to go to all your prenatal visits as directed by your caregiver. °SEEK MEDICAL CARE IF: °· You are unsure if you are in labor or if your water has broken. °· You have dizziness. °· You have   mild pelvic cramps, pelvic pressure, or nagging pain in your abdominal area. °· You have persistent nausea, vomiting, or diarrhea. °· You have a bad smelling vaginal discharge. °· You have pain with urination. °SEEK IMMEDIATE MEDICAL CARE IF:  °· You have a fever. °· You are leaking fluid from your vagina. °· You have spotting or bleeding from your vagina. °· You have severe abdominal cramping or pain. °· You have rapid weight loss or gain. °· You have shortness of breath with chest pain. °· You notice sudden or extreme swelling of your face, hands, ankles, feet, or legs. °· You have not felt your baby move in over an hour. °· You have severe headaches that do not go away with medicine. °· You have vision changes. °  °This information is not intended to replace advice given to you by your health care provider. Make sure you discuss any questions you have with your health care provider. °  °Document Released: 11/08/2001 Document  Revised: 12/05/2014 Document Reviewed: 01/15/2013 °[employer]vier Interactive Patient Education ©2016 [employer] °Fetal Movement Counts °Patient Name: __________________________________________________ Patient Due Date: ____________________ °Performing a fetal movement count is highly recommended in high-risk pregnancies, but it is good for every pregnant woman to do. Your health care provider may ask you to start counting fetal movements at 28 weeks of the pregnancy. Fetal movements often increase: °· After eating a full meal. °· After physical activity. °· After eating or drinking something sweet or cold. °· At rest. °Pay attention to when you feel the baby is most active. This will help you notice a pattern of your baby's sleep and wake cycles and what factors contribute to an increase in fetal movement. It is important to perform a fetal movement count at the same time each day when your baby is normally most active.  °HOW TO COUNT FETAL MOVEMENTS °· Find a quiet and comfortable area to sit or lie down on your left side. Lying on your left side provides the best blood and oxygen circulation to your baby. °· Write down the day and time on a sheet of paper or in a journal. °· Start counting kicks, flutters, swishes, rolls, or jabs in a 2-hour period. You should feel at least 10 movements within 2 hours. °· If you do not feel 10 movements in 2 hours, wait 2-3 hours and count again. Look for a change in the pattern or not enough counts in 2 hours. °SEEK MEDICAL CARE IF: °· You feel less than 10 counts in 2 hours, tried twice. °· There is no movement in over an hour. °· The pattern is changing or taking longer each day to reach 10 counts in 2 hours. °· You feel the baby is not moving as he or she usually does. °Date: ____________ Movements: ____________ Start time: ____________ Finish time: ____________  °Date: ____________ Movements: ____________ Start time: ____________ Finish time: ____________ °Date: ____________  Movements: ____________ Start time: ____________ Finish time: ____________ °Date: ____________ Movements: ____________ Start time: ____________ Finish time: ____________ °Date: ____________ Movements: ____________ Start time: ____________ Finish time: ____________ °Date: ____________ Movements: ____________ Start time: ____________ Finish time: ____________ °Date: ____________ Movements: ____________ Start time: ____________ Finish time: ____________ °Date: ____________ Movements: ____________ Start time: ____________ Finish time: ____________  °Date: ____________ Movements: ____________ Start time: ____________ Finish time: ____________ °Date: ____________ Movements: ____________ Start time: ____________ Finish time: ____________ °Date: ____________ Movements: ____________ Start time: ____________ Finish time: ____________ °Date: ____________ Movements: ____________ Start time: ____________ Finish time: ____________ °Date:   ____________ Movements: ____________ Start time: ____________ Finish time: ____________ °Date: ____________ Movements: ____________ Start time: ____________ Finish time: ____________ °Date: ____________ Movements: ____________ Start time: ____________ Finish time: ____________  °Date: ____________ Movements: ____________ Start time: ____________ Finish time: ____________ °Date: ____________ Movements: ____________ Start time: ____________ Finish time: ____________ °Date: ____________ Movements: ____________ Start time: ____________ Finish time: ____________ °Date: ____________ Movements: ____________ Start time: ____________ Finish time: ____________ °Date: ____________ Movements: ____________ Start time: ____________ Finish time: ____________ °Date: ____________ Movements: ____________ Start time: ____________ Finish time: ____________ °Date: ____________ Movements: ____________ Start time: ____________ Finish time: ____________  °Date: ____________ Movements: ____________ Start time:  ____________ Finish time: ____________ °Date: ____________ Movements: ____________ Start time: ____________ Finish time: ____________ °Date: ____________ Movements: ____________ Start time: ____________ Finish time: ____________ °Date: ____________ Movements: ____________ Start time: ____________ Finish time: ____________ °Date: ____________ Movements: ____________ Start time: ____________ Finish time: ____________ °Date: ____________ Movements: ____________ Start time: ____________ Finish time: ____________ °Date: ____________ Movements: ____________ Start time: ____________ Finish time: ____________  °Date: ____________ Movements: ____________ Start time: ____________ Finish time: ____________ °Date: ____________ Movements: ____________ Start time: ____________ Finish time: ____________ °Date: ____________ Movements: ____________ Start time: ____________ Finish time: ____________ °Date: ____________ Movements: ____________ Start time: ____________ Finish time: ____________ °Date: ____________ Movements: ____________ Start time: ____________ Finish time: ____________ °Date: ____________ Movements: ____________ Start time: ____________ Finish time: ____________ °Date: ____________ Movements: ____________ Start time: ____________ Finish time: ____________  °Date: ____________ Movements: ____________ Start time: ____________ Finish time: ____________ °Date: ____________ Movements: ____________ Start time: ____________ Finish time: ____________ °Date: ____________ Movements: ____________ Start time: ____________ Finish time: ____________ °Date: ____________ Movements: ____________ Start time: ____________ Finish time: ____________ °Date: ____________ Movements: ____________ Start time: ____________ Finish time: ____________ °Date: ____________ Movements: ____________ Start time: ____________ Finish time: ____________ °Date: ____________ Movements: ____________ Start time: ____________ Finish time: ____________  °Date:  ____________ Movements: ____________ Start time: ____________ Finish time: ____________ °Date: ____________ Movements: ____________ Start time: ____________ Finish time: ____________ °Date: ____________ Movements: ____________ Start time: ____________ Finish time: ____________ °Date: ____________ Movements: ____________ Start time: ____________ Finish time: ____________ °Date: ____________ Movements: ____________ Start time: ____________ Finish time: ____________ °Date: ____________ Movements: ____________ Start time: ____________ Finish time: ____________ °Date: ____________ Movements: ____________ Start time: ____________ Finish time: ____________  °Date: ____________ Movements: ____________ Start time: ____________ Finish time: ____________ °Date: ____________ Movements: ____________ Start time: ____________ Finish time: ____________ °Date: ____________ Movements: ____________ Start time: ____________ Finish time: ____________ °Date: ____________ Movements: ____________ Start time: ____________ Finish time: ____________ °Date: ____________ Movements: ____________ Start time: ____________ Finish time: ____________ °Date: ____________ Movements: ____________ Start time: ____________ Finish time: ____________ °  °This information is not intended to replace advice given to you by your health care provider. Make sure you discuss any questions you have with your health care provider. °  °Document Released: 12/14/2006 Document Revised: 12/05/2014 Document Reviewed: 09/10/2012 °[employer] Interactive Patient Education ©2016 [employer] °Braxton Hicks Contractions °Contractions of the uterus can occur throughout pregnancy. Contractions are not always a sign that you are in labor.  °WHAT ARE BRAXTON HICKS CONTRACTIONS?  °Contractions that occur before labor are called Braxton Hicks contractions, or false labor. Toward the end of pregnancy (32-34 weeks), these contractions can develop more often and may become more  forceful. This is not true labor because these contractions do not result in opening (dilatation) and thinning of the cervix. They are sometimes difficult to tell apart from true labor because these contractions can be forceful and people have different pain tolerances. You should   not feel embarrassed if you go to the hospital with false labor. Sometimes, the only way to tell if you are in true labor is for your health care provider to look for changes in the cervix. °If there are no prenatal problems or other health problems associated with the pregnancy, it is completely safe to be sent home with false labor and await the onset of true labor. °HOW CAN YOU TELL THE DIFFERENCE BETWEEN TRUE AND FALSE LABOR? °False Labor °· The contractions of false labor are usually shorter and not as hard as those of true labor.   °· The contractions are usually irregular.   °· The contractions are often felt in the front of the lower abdomen and in the groin.   °· The contractions may go away when you walk around or change positions while lying down.   °· The contractions get weaker and are shorter lasting as time goes on.   °· The contractions do not usually become progressively stronger, regular, and closer together as with true labor.   °True Labor °· Contractions in true labor last 30-70 seconds, become very regular, usually become more intense, and increase in frequency.   °· The contractions do not go away with walking.   °· The discomfort is usually felt in the top of the uterus and spreads to the lower abdomen and low back.   °· True labor can be determined by your health care provider with an exam. This will show that the cervix is dilating and getting thinner.   °WHAT TO REMEMBER °· Keep up with your usual exercises and follow other instructions given by your health care provider.   °· Take medicines as directed by your health care provider.   °· Keep your regular prenatal appointments.   °· Eat and drink lightly if you  think you are going into labor.   °· If Braxton Hicks contractions are making you uncomfortable:   °· Change your position from lying down or resting to walking, or from walking to resting.   °· Sit and rest in a tub of warm water.   °· Drink 2-3 glasses of water. Dehydration may cause these contractions.   °· Do slow and deep breathing several times an hour.   °WHEN SHOULD I SEEK IMMEDIATE MEDICAL CARE? °Seek immediate medical care if: °· Your contractions become stronger, more regular, and closer together.   °· You have fluid leaking or gushing from your vagina.   °· You have a fever.   °· You pass blood-tinged mucus.   °· You have vaginal bleeding.   °· You have continuous abdominal pain.   °· You have low back pain that you never had before.   °· You feel your baby's head pushing down and causing pelvic pressure.   °· Your baby is not moving as much as it used to.   °  °This information is not intended to replace advice given to you by your health care provider. Make sure you discuss any questions you have with your health care provider. °  °Document Released: 11/14/2005 Document Revised: 11/19/2013 Document Reviewed: 08/26/2013 °Elsevier Interactive Patient Education ©2016 [employer] ° °

## 2015-09-09 NOTE — MAU Note (Signed)
Downtime note:  Contractions since 11 pm, just hasn't felt good today.

## 2015-09-09 NOTE — MAU Provider Note (Signed)
History    Theresa Horne is a 24y.o. [id number] at 36.3wks who presents, unannounced, for contractions.  Patient states contractions started at 2300 and have increased in frequency.  Patient states she is coping well with contractions, but reports cervical exam of 3/50 in office on 09/07/2015.  Patient denies LOF and VB and reports active fetus.  Patient with history of preterm delivery at 25 weeks, but has been on 17P throughout this pregnancy.   Patient Active Problem List   Diagnosis Date Noted  . Abdominal pressure 06/09/2015  . History of preterm delivery, currently pregnant -- delivered at 25 wks in March 2012 06/09/2015  . Hydradenitis 04/16/2012    No chief complaint on file.  HPI  OB History    Gravida Para Term Preterm AB TAB SAB Ectopic Multiple Living   3 1 0 1 1 1    1       Past Medical History  Diagnosis Date  . Hypertension   . Preterm labor     Past Surgical History  Procedure Laterality Date  . No past surgeries      Family History  Problem Relation Age of Onset  . Hypertension Father   . Diabetes Paternal Uncle   . Renal Disease Maternal Grandmother     Social History  Substance Use Topics  . Smoking status: Former Smoker -- 0.25 packs/day    Types: Cigarettes  . Smokeless tobacco: Never Used  . Alcohol Use: Yes     Comment: social use before preg    Allergies: No Known Allergies  Prescriptions prior to admission  Medication Sig Dispense Refill Last Dose  . hydroxyprogesterone caproate (DELALUTIN) 250 mg/mL OIL injection Inject 250 mg into the muscle once.   Past Week at Unknown time  . metroNIDAZOLE (FLAGYL) 500 MG tablet Take 1 tablet (500 mg total) by mouth 2 (two) times daily. 14 tablet 0   . Prenatal Vit-Fe Fumarate-FA (PRENATAL MULTIVITAMIN) TABS tablet Take 1 tablet by mouth daily at 12 noon.   07/09/2015 at Unknown time    ROS  See HPI Above Physical Exam   Last menstrual period 12/30/2014.  No results found for this or any  previous visit (from the past 24 hour(s)).  BP: 126/84 Physical Exam SVE: 3/50/-3  FHR:120 bpm, Mod Var, -Decels, +Accels UC: Q3-635min, palpates mild ED Course  Assessment: IUP at 36.3wks Cat I FT Early Labor vs Contractions  Plan: -SVE as above and same as office -Patient offered and declined pain medication -Will reassess in 1.5-2hrs  Follow Up (0122) -In room to discuss procardia usage -R/B discussed including hypotension and decrease in contraction frequency -Patient agrees and is without questions or concerns -Will reassess cervix in one hour  Follow Up (0220) -SVE same -Offered and accepts therapeutic rest -Morphine 4mg  IM and Ambien 5 mg PO once, stat -Labor precautions -Encouraged to call if any questions or concerns arise prior to next scheduled office visit.  -Keep appt as scheduled:  09/14/2015 -Discharged to home in stable condition  Carel Schnee LYNN CNM, MSN 09/09/2015 3:06 AM

## 2015-09-20 ENCOUNTER — Inpatient Hospital Stay (HOSPITAL_COMMUNITY)
Admission: AD | Admit: 2015-09-20 | Discharge: 2015-09-20 | Disposition: A | Discharge status: Home / Self Care | Payer: PRIVATE HEALTH INSURANCE | Source: Ambulatory Visit | Attending: Obstetrics and Gynecology | Admitting: Obstetrics and Gynecology

## 2015-09-20 ENCOUNTER — Encounter (HOSPITAL_COMMUNITY): Payer: Self-pay | Admitting: *Deleted

## 2015-09-20 DIAGNOSIS — Z87891 Personal history of nicotine dependence: Secondary | ICD-10-CM | POA: Diagnosis not present

## 2015-09-20 DIAGNOSIS — Z3A38 38 weeks gestation of pregnancy: Secondary | ICD-10-CM | POA: Diagnosis not present

## 2015-09-20 DIAGNOSIS — O479 False labor, unspecified: Secondary | ICD-10-CM

## 2015-09-20 NOTE — MAU Note (Signed)
Ctxs since 2400. Denies LOF or bleeding. Last sve was 3.5cm

## 2015-09-20 NOTE — Progress Notes (Signed)
Written and verbal d/c instructions given and understanding voiced. 

## 2015-09-20 NOTE — Discharge Instructions (Signed)
Braxton Hicks Contractions °Contractions of the uterus can occur throughout pregnancy. Contractions are not always a sign that you are in labor.  °WHAT ARE BRAXTON HICKS CONTRACTIONS?  °Contractions that occur before labor are called Braxton Hicks contractions, or false labor. Toward the end of pregnancy (32-34 weeks), these contractions can develop more often and may become more forceful. This is not true labor because these contractions do not result in opening (dilatation) and thinning of the cervix. They are sometimes difficult to tell apart from true labor because these contractions can be forceful and people have different pain tolerances. You should not feel embarrassed if you go to the hospital with false labor. Sometimes, the only way to tell if you are in true labor is for your health care provider to look for changes in the cervix. °If there are no prenatal problems or other health problems associated with the pregnancy, it is completely safe to be sent home with false labor and await the onset of true labor. °HOW CAN YOU TELL THE DIFFERENCE BETWEEN TRUE AND FALSE LABOR? °False Labor °· The contractions of false labor are usually shorter and not as hard as those of true labor.   °· The contractions are usually irregular.   °· The contractions are often felt in the front of the lower abdomen and in the groin.   °· The contractions may go away when you walk around or change positions while lying down.   °· The contractions get weaker and are shorter lasting as time goes on.   °· The contractions do not usually become progressively stronger, regular, and closer together as with true labor.   °True Labor °· Contractions in true labor last 30-70 seconds, become very regular, usually become more intense, and increase in frequency.   °· The contractions do not go away with walking.   °· The discomfort is usually felt in the top of the uterus and spreads to the lower abdomen and low back.   °· True labor can be  determined by your health care provider with an exam. This will show that the cervix is dilating and getting thinner.   °WHAT TO REMEMBER °· Keep up with your usual exercises and follow other instructions given by your health care provider.   °· Take medicines as directed by your health care provider.   °· Keep your regular prenatal appointments.   °· Eat and drink lightly if you think you are going into labor.   °· If Braxton Hicks contractions are making you uncomfortable:   °¨ Change your position from lying down or resting to walking, or from walking to resting.   °¨ Sit and rest in a tub of warm water.   °¨ Drink 2-3 glasses of water. Dehydration may cause these contractions.   °¨ Do slow and deep breathing several times an hour.   °WHEN SHOULD I SEEK IMMEDIATE MEDICAL CARE? °Seek immediate medical care if: °· Your contractions become stronger, more regular, and closer together.   °· You have fluid leaking or gushing from your vagina.   °· You have a fever.   °· You pass blood-tinged mucus.   °· You have vaginal bleeding.   °· You have continuous abdominal pain.   °· You have low back pain that you never had before.   °· You feel your baby's head pushing down and causing pelvic pressure.   °· Your baby is not moving as much as it used to.   °  °This information is not intended to replace advice given to you by your health care provider. Make sure you discuss any questions you have with your health care   provider. °  °Document Released: 11/14/2005 Document Revised: 11/19/2013 Document Reviewed: 08/26/2013 °Elsevier Interactive Patient Education ©2016 Elsevier Inc. ° °

## 2015-09-20 NOTE — Progress Notes (Signed)
Jessica Emly CNM notified of pt's admission and status. Will see pt 

## 2015-09-20 NOTE — MAU Provider Note (Signed)
History    Christy GentlesBrittany Golubski is a 24 y.o. [id number] at 38wks who presents for contractions.  Patient states contractions started at 12am and have been every 5 minutes.  Patient reports good fetal movement and denies LOF and VB.  Reports pain is 6-7/10.   Patient Active Problem List   Diagnosis Date Noted  . Abdominal pressure 06/09/2015  . History of preterm delivery, currently pregnant -- delivered at 25 wks in March 2012 06/09/2015  . Hydradenitis 04/16/2012    Chief Complaint  Patient presents with  . Contractions   HPI  OB History    Gravida Para Term Preterm AB TAB SAB Ectopic Multiple Living   3 1 0 1 1 1    1       Past Medical History  Diagnosis Date  . Hypertension   . Preterm labor     Past Surgical History  Procedure Laterality Date  . Induced abortion      Family History  Problem Relation Age of Onset  . Hypertension Father   . Diabetes Paternal Uncle   . Renal Disease Maternal Grandmother     Social History  Substance Use Topics  . Smoking status: Former Smoker -- 0.25 packs/day    Types: Cigarettes  . Smokeless tobacco: Never Used  . Alcohol Use: Yes     Comment: social use before preg    Allergies: No Known Allergies  Prescriptions prior to admission  Medication Sig Dispense Refill Last Dose  . calcium carbonate (TUMS - DOSED IN MG ELEMENTAL CALCIUM) 500 MG chewable tablet Chew 1 tablet by mouth daily.   Past Week at Unknown time  . Prenatal Vit-Fe Fumarate-FA (PRENATAL MULTIVITAMIN) TABS tablet Take 1 tablet by mouth daily at 12 noon.   09/19/2015 at Unknown time  . hydroxyprogesterone caproate (DELALUTIN) 250 mg/mL OIL injection Inject 250 mg into the muscle once.   Past Week at Unknown time  . metroNIDAZOLE (FLAGYL) 500 MG tablet Take 1 tablet (500 mg total) by mouth 2 (two) times daily. 14 tablet 0     ROS  See HPI Above Physical Exam   Blood pressure 135/77, pulse 49, temperature 98.2 F (36.8 C), resp. rate 18, height 5\' 4"  (1.626 m),  weight 111.766 kg (246 lb 6.4 oz), last menstrual period 12/30/2014, SpO2 100 %.  No results found for this or any previous visit (from the past 24 hour(s)).  Physical Exam SVE: 3/50/-2  FHR:130 bpm, Mod Var, -Decels, +Accels UC: Irregular ED Course  Assessment: IUP at 38wks Cat I FT Contractions  Plan: -SVE remains same as past check  -Given Early Labor Options:  A: Home with or w/o therapeutic rest B: Ambulate and reassess C: Pain medication and reassess -Patient opts for A without therapeutic rest -Reactive NST -Encouraged to call if any questions or concerns arise prior to next scheduled office visit.  -Discharged to home in stable condition  Jamice Carreno LYNN CNM, MSN 09/20/2015 2:31 AM

## 2015-10-03 ENCOUNTER — Inpatient Hospital Stay (HOSPITAL_COMMUNITY)
Admission: AD | Admit: 2015-10-03 | Discharge: 2015-10-04 | DRG: 775 | Disposition: A | Discharge status: Home / Self Care | Payer: Medicaid Other | Source: Ambulatory Visit | Attending: Obstetrics & Gynecology | Admitting: Obstetrics & Gynecology

## 2015-10-03 ENCOUNTER — Encounter (HOSPITAL_COMMUNITY): Payer: Self-pay | Admitting: *Deleted

## 2015-10-03 DIAGNOSIS — D649 Anemia, unspecified: Secondary | ICD-10-CM

## 2015-10-03 DIAGNOSIS — Z87891 Personal history of nicotine dependence: Secondary | ICD-10-CM

## 2015-10-03 DIAGNOSIS — O9989 Other specified diseases and conditions complicating pregnancy, childbirth and the puerperium: Secondary | ICD-10-CM

## 2015-10-03 DIAGNOSIS — Z833 Family history of diabetes mellitus: Secondary | ICD-10-CM | POA: Diagnosis not present

## 2015-10-03 DIAGNOSIS — Z283 Underimmunization status: Secondary | ICD-10-CM

## 2015-10-03 DIAGNOSIS — O99892 Other specified diseases and conditions complicating childbirth: Secondary | ICD-10-CM

## 2015-10-03 DIAGNOSIS — Z3A39 39 weeks gestation of pregnancy: Secondary | ICD-10-CM | POA: Diagnosis not present

## 2015-10-03 DIAGNOSIS — B951 Streptococcus, group B, as the cause of diseases classified elsewhere: Secondary | ICD-10-CM | POA: Diagnosis present

## 2015-10-03 DIAGNOSIS — Z8249 Family history of ischemic heart disease and other diseases of the circulatory system: Secondary | ICD-10-CM

## 2015-10-03 DIAGNOSIS — O99824 Streptococcus B carrier state complicating childbirth: Secondary | ICD-10-CM | POA: Diagnosis present

## 2015-10-03 DIAGNOSIS — IMO0001 Reserved for inherently not codable concepts without codable children: Secondary | ICD-10-CM

## 2015-10-03 DIAGNOSIS — Z2839 Other underimmunization status: Secondary | ICD-10-CM

## 2015-10-03 LAB — CBC
HEMATOCRIT: 34.5 % — AB (ref 36.0–46.0)
HEMOGLOBIN: 11.6 g/dL — AB (ref 12.0–15.0)
MCH: 28.7 pg (ref 26.0–34.0)
MCHC: 33.6 g/dL (ref 30.0–36.0)
MCV: 85.4 fL (ref 78.0–100.0)
Platelets: 157 10*3/uL (ref 150–400)
RBC: 4.04 MIL/uL (ref 3.87–5.11)
RDW: 14.5 % (ref 11.5–15.5)
WBC: 9.1 10*3/uL (ref 4.0–10.5)

## 2015-10-03 LAB — TYPE AND SCREEN
ABO/RH(D): O POS
Antibody Screen: NEGATIVE

## 2015-10-03 LAB — RAPID HIV SCREEN (HIV 1/2 AB+AG)
HIV 1/2 Antibodies: NONREACTIVE
HIV-1 P24 ANTIGEN - HIV24: NONREACTIVE

## 2015-10-03 LAB — RPR: RPR: NONREACTIVE

## 2015-10-03 MED ORDER — PHENYLEPHRINE 40 MCG/ML (10ML) SYRINGE FOR IV PUSH (FOR BLOOD PRESSURE SUPPORT)
80.0000 ug | PREFILLED_SYRINGE | INTRAVENOUS | Status: DC | PRN
  Filled 2015-10-03: qty 2
  Filled 2015-10-03: qty 20

## 2015-10-03 MED ORDER — PENICILLIN G POTASSIUM 5000000 UNITS IJ SOLR
2.5000 10*6.[IU] | INTRAVENOUS | Status: DC
  Filled 2015-10-03 (×3): qty 2.5

## 2015-10-03 MED ORDER — ACETAMINOPHEN 325 MG PO TABS: 650.0000 mg | ORAL_TABLET | ORAL | Status: DC | PRN

## 2015-10-03 MED ORDER — LIDOCAINE HCL (PF) 1 % IJ SOLN
30.0000 mL | INTRAMUSCULAR | Status: DC | PRN
Start: 2015-10-03 — End: 2015-10-04
  Administered 2015-10-03: 30 mL via SUBCUTANEOUS
  Filled 2015-10-03: qty 30

## 2015-10-03 MED ORDER — LACTATED RINGERS IV SOLN: INTRAVENOUS | Status: DC

## 2015-10-03 MED ORDER — PRENATAL MULTIVITAMIN CH
1.0000 | ORAL_TABLET | Freq: Every day | ORAL | Status: DC
  Administered 2015-10-03 – 2015-10-04 (×2): 1 via ORAL
  Filled 2015-10-03 (×2): qty 1

## 2015-10-03 MED ORDER — FENTANYL CITRATE (PF) 100 MCG/2ML IJ SOLN
50.0000 ug | INTRAMUSCULAR | Status: DC | PRN
  Administered 2015-10-03 (×2): 100 ug via INTRAVENOUS
  Filled 2015-10-03 (×2): qty 2

## 2015-10-03 MED ORDER — WITCH HAZEL-GLYCERIN EX PADS: 1.0000 "application " | MEDICATED_PAD | CUTANEOUS | Status: DC | PRN

## 2015-10-03 MED ORDER — CITRIC ACID-SODIUM CITRATE 334-500 MG/5ML PO SOLN: 30.0000 mL | ORAL | Status: DC | PRN

## 2015-10-03 MED ORDER — OXYCODONE-ACETAMINOPHEN 5-325 MG PO TABS: 2.0000 | ORAL_TABLET | ORAL | Status: DC | PRN

## 2015-10-03 MED ORDER — FENTANYL 2.5 MCG/ML BUPIVACAINE 1/10 % EPIDURAL INFUSION (WH - ANES)
14.0000 mL/h | INTRAMUSCULAR | Status: DC | PRN
  Filled 2015-10-03: qty 125

## 2015-10-03 MED ORDER — ONDANSETRON HCL 4 MG/2ML IJ SOLN: 4.0000 mg | INTRAMUSCULAR | Status: DC | PRN

## 2015-10-03 MED ORDER — SENNOSIDES-DOCUSATE SODIUM 8.6-50 MG PO TABS
2.0000 | ORAL_TABLET | ORAL | Status: DC
  Administered 2015-10-03: 2 via ORAL
  Filled 2015-10-03: qty 2

## 2015-10-03 MED ORDER — OXYTOCIN 40 UNITS IN LACTATED RINGERS INFUSION - SIMPLE MED
62.5000 mL/h | INTRAVENOUS | Status: DC
  Filled 2015-10-03: qty 1000

## 2015-10-03 MED ORDER — OXYTOCIN BOLUS FROM INFUSION
500.0000 mL | INTRAVENOUS | Status: DC
  Administered 2015-10-03: 500 mL via INTRAVENOUS

## 2015-10-03 MED ORDER — LACTATED RINGERS IV SOLN: 500.0000 mL | INTRAVENOUS | Status: DC | PRN

## 2015-10-03 MED ORDER — DIBUCAINE 1 % RE OINT: 1.0000 "application " | TOPICAL_OINTMENT | RECTAL | Status: DC | PRN

## 2015-10-03 MED ORDER — BENZOCAINE-MENTHOL 20-0.5 % EX AERO: 1.0000 "application " | INHALATION_SPRAY | CUTANEOUS | Status: DC | PRN

## 2015-10-03 MED ORDER — TETANUS-DIPHTH-ACELL PERTUSSIS 5-2.5-18.5 LF-MCG/0.5 IM SUSP
0.5000 mL | Freq: Once | INTRAMUSCULAR | Status: AC
  Administered 2015-10-03: 0.5 mL via INTRAMUSCULAR

## 2015-10-03 MED ORDER — DIPHENHYDRAMINE HCL 25 MG PO CAPS: 25.0000 mg | ORAL_CAPSULE | Freq: Four times a day (QID) | ORAL | Status: DC | PRN

## 2015-10-03 MED ORDER — PENICILLIN G POTASSIUM 5000000 UNITS IJ SOLR
5.0000 10*6.[IU] | Freq: Once | INTRAMUSCULAR | Status: AC
  Administered 2015-10-03: 5 10*6.[IU] via INTRAVENOUS
  Filled 2015-10-03: qty 5

## 2015-10-03 MED ORDER — ONDANSETRON HCL 4 MG PO TABS: 4.0000 mg | ORAL_TABLET | ORAL | Status: DC | PRN

## 2015-10-03 MED ORDER — OXYCODONE-ACETAMINOPHEN 5-325 MG PO TABS
1.0000 | ORAL_TABLET | ORAL | Status: DC | PRN
  Administered 2015-10-03 (×2): 1 via ORAL
  Filled 2015-10-03 (×2): qty 1

## 2015-10-03 MED ORDER — ONDANSETRON HCL 4 MG/2ML IJ SOLN: 4.0000 mg | Freq: Four times a day (QID) | INTRAMUSCULAR | Status: DC | PRN

## 2015-10-03 MED ORDER — IBUPROFEN 600 MG PO TABS
600.0000 mg | ORAL_TABLET | Freq: Four times a day (QID) | ORAL | Status: DC
  Administered 2015-10-03 – 2015-10-04 (×6): 600 mg via ORAL
  Filled 2015-10-03 (×6): qty 1

## 2015-10-03 MED ORDER — DIPHENHYDRAMINE HCL 50 MG/ML IJ SOLN: 12.5000 mg | INTRAMUSCULAR | Status: DC | PRN

## 2015-10-03 MED ORDER — EPHEDRINE 5 MG/ML INJ
10.0000 mg | INTRAVENOUS | Status: DC | PRN
  Filled 2015-10-03: qty 2

## 2015-10-03 MED ORDER — OXYCODONE-ACETAMINOPHEN 5-325 MG PO TABS: 1.0000 | ORAL_TABLET | ORAL | Status: DC | PRN

## 2015-10-03 MED ORDER — ZOLPIDEM TARTRATE 5 MG PO TABS: 5.0000 mg | ORAL_TABLET | Freq: Every evening | ORAL | Status: DC | PRN

## 2015-10-03 MED ORDER — FENTANYL CITRATE (PF) 100 MCG/2ML IJ SOLN
INTRAMUSCULAR | Status: AC
  Filled 2015-10-03: qty 2

## 2015-10-03 MED ORDER — LANOLIN HYDROUS EX OINT: TOPICAL_OINTMENT | CUTANEOUS | Status: DC | PRN

## 2015-10-03 MED ORDER — SIMETHICONE 80 MG PO CHEW: 80.0000 mg | CHEWABLE_TABLET | ORAL | Status: DC | PRN

## 2015-10-03 NOTE — Lactation Note (Signed)
This note was copied from the chart of Theresa Horne. Lactation Consultation Note  Patient Name: Theresa Horne Today's Date: 10/03/2015 Reason for consult: Initial assessment Baby at 16 hr of life, mom is breast and formula feeding. She had a room full of visitors and did not want to go over manual expression at this time. Baby is spitting up, talk about belly size, feeding frequency, and breast changes. Discussed baby behavior. Given lactation handouts. She is aware of OP services and support group.   Maternal Data Has patient been taught Hand Expression?: No Does the patient have breastfeeding experience prior to this delivery?: No  Feeding Feeding Type: Formula Nipple Type: Slow - flow  LATCH Score/Interventions                      Lactation Tools Discussed/Used     Consult Status Consult Status: Follow-up Date: 10/04/15 Follow-up type: In-patient    Theresa Horne 10/03/2015, 6:46 PM

## 2015-10-03 NOTE — H&P (Signed)
Theresa Horne is a 24 y.o. female, G3P0111 at 39.6 weeks, presenting for contractions.  Patient states contractions started about 45 minutes prior to arrival.  Patient reports active fetus and denies LOF and VB.  Patient desires an epidural and is GBS positive.    Patient Active Problem List   Diagnosis Date Noted  . Abdominal pressure 06/09/2015  . History of preterm delivery, currently pregnant -- delivered at 25 wks in March 2012 06/09/2015  . Hydradenitis 04/16/2012    History of present pregnancy: Patient entered care at 10.4 weeks.   EDC of 10/06/2015 was established by Definite LMP of 12/30/2014 and confirmed by 6.4wk [name] on 02/12/2015.   Anatomy scan:  20.1 weeks, with normal findings and an anterior placenta.   Additional [name] evaluations:   -Anatomy:  EFW 11 oz, cervix 4.09cm, anterior placenta. Complete and appears normal.   25wks: EFW 44TH%, CEPHALIC, NORMAL FLUID. 34wks: U/S Growth U/S: Singleton pregnancy. Vertex presentation. Anterior placenta. AFI is normal-50th%. Cx not measured-per protocol. [place] are unremarkable. Significant prenatal events:  Treated for BV x 2.  Patient c/o abdominal, back, and pelvic pain.  Patient with 17P injections throughout antepartum until 36 weeks.    Last evaluation:  09/28/2015 by Dr. Audree Camel.  FHR 140.  SVE: 3/50/-3, BP 118/60, WT 247lbs, 4+ edema.  OB History    Gravida Para Term Preterm AB TAB SAB Ectopic Multiple Living   3 1 0 2009: TAB 2012: PTD of 25wk Infant-Boy SVD Past Medical History  Diagnosis Date  . Hypertension   . Preterm labor    Past Surgical History  Procedure Laterality Date  . Induced abortion     Family History: family history includes Diabetes in her paternal uncle; Hypertension in her father; Renal Disease in her maternal grandmother. Social History:  reports that she has quit smoking. Her smoking use included Cigarettes. She smoked 0.25 packs per day. She has never used smokeless tobacco. She  reports that she drinks alcohol. She reports that she does not use illicit drugs.  Patient is a CNA.  Support person is her mother and FOB, Rilo.    Prenatal Transfer Tool  Maternal Diabetes: No Genetic Screening: Declined Maternal Ultrasounds/Referrals: Normal Fetal Ultrasounds or other Referrals:  None Maternal Substance Abuse:  No Significant Maternal Medications:  Meds include: Other:  Makena Significant Maternal Lab Results: Lab values include: Group B Strep positive    ROS:  +Ctx, -LoF, -VB, +FM  No Known Allergies   Dilation: 5 Effacement (%): 80 Station: -2 Exam by:: J.Derenda Giddings,CNM Last menstrual period 12/30/2014.  Physical Exam  Constitutional: She is oriented to person, place, and time. She appears well-developed and well-nourished.  HENT:  Head: Normocephalic and atraumatic.  Eyes: EOM are normal. Pupils are equal, round, and reactive to light.  Neck: Normal range of motion.  Cardiovascular: Normal rate.   Respiratory: Effort normal.  GI: Soft.  Genitourinary: Vagina normal.  Musculoskeletal: Normal range of motion.  Neurological: She is alert and oriented to person, place, and time.  Skin: Skin is warm and dry.  Psychiatric: She has a normal mood and affect.     FHR:135 bpm, Mod Var, -Decels, +Accels UCs:  Q2-73min, palpates moderate  Prenatal labs: ABO, Rh: --/--/O POS (03/04 1400) Antibody:  Negative Rubella:  !Error! NI RPR:   NR HBsAg:   Negative HIV: Non Reactive (03/04 1400)  GBS:  Positive Sickle cell/Hgb electrophoresis:  Normal Pap:  Abnormal  02/2015 GC:  Negative Chlamydia:  Negative Other:  None    Assessment IUP at 39.6wks Cat I FT Active Labor GBS Positive Rubella NI  [facility]: [facility] Rout[facility] Labor and Delivery Orders per [facility] Protocol In room to complete assessment and discuss POC: -Okay for epidural -Will receive PCN for GBS prophylaxis Dr.E. Sallye OberKulwa to be updated as appropriate  Brenn Gatton Jerral RalphLYNNCNM,  MSN 10/03/2015, 1:07 AM

## 2015-10-03 NOTE — MAU Note (Signed)
Ctx since 1200am. Denies SROM or bleeding and good fetal movement reported.

## 2015-10-03 NOTE — Progress Notes (Signed)
Subjective: Postpartum Day 0: Vaginal delivery, 2nd degree perineal laceration Patient up ad lib, reports no syncope or dizziness. Feeding:  Breast Contraceptive plan:  Undecided  Did not receive GBS prophylaxis due to rapid labor.  Objective: Vital signs in last 24 hours: Temp:  [97.8 F (36.6 C)-98.4 F (36.9 C)] 98.4 F (36.9 C) (11/05 0900) Pulse Rate:  [76-117] 80 (11/05 0900) Resp:  [16-22] 18 (11/05 0900) BP: (127-156)/(66-105) 127/73 mmHg (11/05 0900) SpO2:  [99 %] 99 % (11/05 0229) Weight:  [112.038 kg (247 lb)] 112.038 kg (247 lb) (11/05 [id number])  Physical Exam:  General: alert Lochia: appropriate Uterine Fundus: firm Perineum: healing well DVT Evaluation: No evidence of DVT seen on physical exam. Negative Homan's sign.   CBC Latest Ref Rng 10/03/2015 01/30/2015 12/16/2014  WBC 4.0 - 10.5 K/uL 9.1 6.3 7.2  Hemoglobin 12.0 - 15.0 g/dL 11.6(L) 12.4 11.6(L)  Hematocrit 36.0 - 46.0 % 34.5(L) 37.0 35.3(L)  Platelets 150 - 400 K/uL 157 180 186     Assessment/Plan: Status post vaginal delivery day 0. Stable Continue current care. Plan d/c 11/7 due to no GBS prophylaxis.   Nigel BridgemanLATHAM, Kimeka Badour, CNM 10/03/2015, 10:41 AM

## 2015-10-04 ENCOUNTER — Ambulatory Visit: Payer: Self-pay

## 2015-10-04 DIAGNOSIS — D649 Anemia, unspecified: Secondary | ICD-10-CM

## 2015-10-04 LAB — CBC
HCT: 26.6 % — ABNORMAL LOW (ref 36.0–46.0)
HEMOGLOBIN: 8.7 g/dL — AB (ref 12.0–15.0)
MCH: 28.4 pg (ref 26.0–34.0)
MCHC: 32.7 g/dL (ref 30.0–36.0)
MCV: 86.9 fL (ref 78.0–100.0)
PLATELETS: 128 10*3/uL — AB (ref 150–400)
RBC: 3.06 MIL/uL — AB (ref 3.87–5.11)
RDW: 14.9 % (ref 11.5–15.5)
WBC: 8.1 10*3/uL (ref 4.0–10.5)

## 2015-10-04 MED ORDER — FERROUS SULFATE 325 (65 FE) MG PO TABS
325.0000 mg | ORAL_TABLET | Freq: Two times a day (BID) | ORAL | Status: DC
  Administered 2015-10-04: 325 mg via ORAL
  Filled 2015-10-04: qty 1

## 2015-10-04 MED ORDER — OXYCODONE-ACETAMINOPHEN 5-325 MG PO TABS: 1.0000 | ORAL_TABLET | ORAL | Status: DC | PRN

## 2015-10-04 MED ORDER — IBUPROFEN 600 MG PO TABS: 600.0000 mg | ORAL_TABLET | Freq: Four times a day (QID) | ORAL | Status: DC | PRN

## 2015-10-04 MED ORDER — FERROUS SULFATE 325 (65 FE) MG PO TABS: 325.0000 mg | ORAL_TABLET | Freq: Every day | ORAL | Status: DC

## 2015-10-04 NOTE — Lactation Note (Signed)
This note was copied from the chart of Theresa [place]Brittany Gremillion. Lactation Consultation Note  P2, First child was in NICU and mother pumped for 2 months. States she has attempted breastfeeding 3x since birth and has had difficulty latching so she decided to pump and formula feed. Mother states she has attempted pumping w/ manual pump approx 2 times and has received drops to nothing. Offered to assist w/ latching.  Reviewed waking techniques and feeding baby STS. Baby was fed at 1300.  Attempted but baby fell asleep at the breast.  Reviewed hand expression w/ mother and set up DEBP. Recommend she call for help w/ next feeding.  Then if baby is unable to latch to give her formula and post pump 4-6x day. Reviewed milk storage, spoon feeding and provided mother w/ cleaning supplies. Set up DEBP w/ #27 flanges and needs #27 flange for manual pump also.   Patient Name: Theresa Christy GentlesBrittany Furia ZOXWR'UToday's Date: 10/04/2015 Reason for consult: Follow-up assessment   Maternal Data    Feeding Feeding Type: Bottle Fed - Formula Nipple Type: Slow - flow  LATCH Score/Interventions                      Lactation Tools Discussed/Used Pump Review: Setup, frequency, and cleaning;Milk Storage Initiated by:: Dahlia Byesuth Berkelhammer RN Date initiated:: 10/04/15   Consult Status Consult Status: Follow-up Date: 10/05/15 Follow-up type: In-patient    Dahlia ByesBerkelhammer, [facility] 10/04/2015, 3:22 PM

## 2015-10-04 NOTE — Discharge Instructions (Signed)
Postpartum Care After Vaginal Delivery °After you deliver your newborn (postpartum period), the usual stay in the hospital is 24-72 hours. If there were problems with your labor or delivery, or if you have other medical problems, you might be in the hospital longer.  °While you are in the hospital, you will receive help and instructions on how to care for yourself and your newborn during the postpartum period.  °While you are in the hospital: °· Be sure to tell your nurses if you have pain or discomfort, as well as where you feel the pain and what makes the pain worse. °· If you had an incision made near your vagina (episiotomy) or if you had some tearing during delivery, the nurses may put ice packs on your episiotomy or tear. The ice packs may help to reduce the pain and swelling. °· If you are breastfeeding, you may feel uncomfortable contractions of your uterus for a couple of weeks. This is normal. The contractions help your uterus get back to normal size. °· It is normal to have some bleeding after delivery. °· For the first 1-3 days after delivery, the flow is red and the amount may be similar to a period. °· It is common for the flow to start and stop. °· In the first few days, you may pass some small clots. Let your nurses know if you begin to pass large clots or your flow increases. °· Do not  flush blood clots down the toilet before having the nurse look at them. °· During the next 3-10 days after delivery, your flow should become more watery and pink or brown-tinged in color. °· Ten to fourteen days after delivery, your flow should be a small amount of yellowish-white discharge. °· The amount of your flow will decrease over the first few weeks after delivery. Your flow may stop in 6-8 weeks. Most women have had their flow stop by 12 weeks after delivery. °· You should change your sanitary pads frequently. °· Wash your hands thoroughly with soap and water for at least 20 seconds after changing pads, using  the toilet, or before holding or feeding your newborn. °· You should feel like you need to empty your bladder within the first 6-8 hours after delivery. °· In case you become weak, lightheaded, or faint, call your nurse before you get out of bed for the first time and before you take a shower for the first time. °· Within the first few days after delivery, your breasts may begin to feel tender and full. This is called engorgement. Breast tenderness usually goes away within 48-72 hours after engorgement occurs. You may also notice milk leaking from your breasts. If you are not breastfeeding, do not stimulate your breasts. Breast stimulation can make your breasts produce more milk. °· Spending as much time as possible with your newborn is very important. During this time, you and your newborn can feel close and get to know each other. Having your newborn stay in your room (rooming in) will help to strengthen the bond with your newborn.  It will give you time to get to know your newborn and become comfortable caring for your newborn. °· Your hormones change after delivery. Sometimes the hormone changes can temporarily cause you to feel sad or tearful. These feelings should not last more than a few days. If these feelings last longer than that, you should talk to your caregiver. °· If desired, talk to your caregiver about methods of family planning or contraception. °·   Talk to your caregiver about immunizations. Your caregiver may want you to have the following immunizations before leaving the hospital: °· Tetanus, diphtheria, and pertussis (Tdap) or tetanus and diphtheria (Td) immunization. It is very important that you and your family (including grandparents) or others caring for your newborn are up-to-date with the Tdap or Td immunizations. The Tdap or Td immunization can help protect your newborn from getting ill. °· Rubella immunization. °· Varicella (chickenpox) immunization. °· Influenza immunization. You should  receive this annual immunization if you did not receive the immunization during your pregnancy. °  °This information is not intended to replace advice given to you by your health care provider. Make sure you discuss any questions you have with your health care provider. °  °Document Released: 09/11/2007 Document Revised: 08/08/2012 Document Reviewed: 07/11/2012 °Elsevier Interactive Patient Education ©2016 Elsevier Inc. ° °Iron-Rich Diet °Iron is a mineral that helps your body to produce hemoglobin. Hemoglobin is a protein in your red blood cells that carries oxygen to your body's tissues. Eating too little iron may cause you to feel weak and tired, and it can increase your risk for infection. Eating enough iron is necessary for your body's metabolism, muscle function, and nervous system. °Iron is naturally found in many foods. It can also be added to foods or fortified in foods. There are two types of dietary iron: °· Heme iron. Heme iron is absorbed by the body more easily than nonheme iron. Heme iron is found in meat, poultry, and fish. °· Nonheme iron. Nonheme iron is found in dietary supplements, iron-fortified grains, beans, and vegetables. °You may need to follow an iron-rich diet if: °· You have been diagnosed with iron deficiency or iron-deficiency anemia. °· You have a condition that prevents you from absorbing dietary iron, such as: °¨ Infection in your intestines. °¨ Celiac disease. This involves long-lasting (chronic) inflammation of your intestines. °· You do not eat enough iron. °· You eat a diet that is high in foods that impair iron absorption. °· You have lost a lot of blood. °· You have heavy bleeding during your menstrual cycle. °· You are pregnant. °WHAT IS MY PLAN? °Your health care provider may help you to determine how much iron you need per day based on your condition. Generally, when a person consumes sufficient amounts of iron in the diet, the following iron needs are met: °· Men. °¨ 14-18  years old: 11 mg per day. °¨ 19-50 years old: 8 mg per day. °· Women.   °¨ 14-18 years old: 15 mg per day. °¨ 19-50 years old: 18 mg per day. °¨ Over 50 years old: 8 mg per day. °¨ Pregnant women: 27 mg per day. °¨ Breastfeeding women: 9 mg per day. °WHAT DO I NEED TO KNOW ABOUT AN IRON-RICH DIET? °· Eat fresh fruits and vegetables that are high in vitamin C along with foods that are high in iron. This will help increase the amount of iron that your body absorbs from food, especially with foods containing nonheme iron. Foods that are high in vitamin C include oranges, peppers, tomatoes, and mango. °· Take iron supplements only as directed by your health care provider. Overdose of iron can be life-threatening. If you were prescribed iron supplements, take them with orange juice or a vitamin C supplement. °· Cook foods in pots and pans that are made from iron.   °· Eat nonheme iron-containing foods alongside foods that are high in heme iron. This helps to improve your iron absorption.   °·   Certain foods and drinks contain compounds that impair iron absorption. Avoid eating these foods in the same meal as iron-rich foods or with iron supplements. These include: °¨ Coffee, black tea, and red wine. °¨ Milk, dairy products, and foods that are high in calcium. °¨ Beans, soybeans, and peas. °¨ Whole grains. °· When eating foods that contain both nonheme iron and compounds that impair iron absorption, follow these tips to absorb iron better.   °¨ Soak beans overnight before cooking. °¨ Soak whole grains overnight and drain them before using. °¨ Ferment flours before baking, such as using yeast in bread dough. °WHAT FOODS CAN I EAT? °Grains  °Iron-fortified breakfast cereal. Iron-fortified whole-wheat bread. Enriched rice. Sprouted grains. °Vegetables  °Spinach. Potatoes with skin. Green peas. Broccoli. Red and green bell peppers. Fermented vegetables. °Fruits  °Prunes. Raisins. Oranges. Strawberries. Mango.  Grapefruit. °Meats and Other Protein Sources  °Beef liver. Oysters. Beef. Shrimp. Turkey. Chicken. Tuna. Sardines. Chickpeas. Nuts. Tofu. °Beverages  °Tomato juice. Fresh orange juice. Prune juice. Hibiscus tea. Fortified instant breakfast shakes. °Condiments  °Tahini. Fermented soy sauce.  °Sweets and Desserts  °Black-strap molasses.  °Other  °Wheat germ. °The items listed above may not be a complete list of recommended foods or beverages. Contact your dietitian for more options.  °WHAT FOODS ARE NOT RECOMMENDED? °Grains  °Whole grains. Bran cereal. Bran flour. Oats. °Vegetables  °Artichokes. Brussels sprouts. Kale. °Fruits  °Blueberries. Raspberries. Strawberries. Figs. °Meats and Other Protein Sources  °Soybeans. Products made from soy protein. °Dairy  °Milk. Cream. Cheese. Yogurt. Cottage cheese. °Beverages  °Coffee. Black tea. Red wine. °Sweets and Desserts  °Cocoa. Chocolate. Ice cream. °Other  °Basil. Oregano. Parsley. °The items listed above may not be a complete list of foods and beverages to avoid. Contact your dietitian for more information.  °  °This information is not intended to replace advice given to you by your health care provider. Make sure you discuss any questions you have with your health care provider. °  °Document Released: 06/28/2005 Document Revised: 12/05/2014 Document Reviewed: 06/11/2014 °Elsevier Interactive Patient Education ©2016 Elsevier Inc. ° °

## 2015-10-04 NOTE — Progress Notes (Signed)
Subjective: Postpartum Day 1: Vaginal delivery,  2nd degree perineal laceration Patient up ad lib, reports no syncope or dizziness. Feeding:  Breast Contraceptive plan:  Declines at present  Objective: Vital signs in last 24 hours: Temp:  [98.2 F (36.8 C)-98.4 F (36.9 C)] 98.4 F (36.9 C) (11/06 0502) Pulse Rate:  [80-87] 87 (11/06 0502) Resp:  [16-18] 16 (11/06 0502) BP: (119-135)/(63-76) 126/68 mmHg (11/06 0502)  Physical Exam:  General: alert Lochia: appropriate Uterine Fundus: firm Perineum: healing well DVT Evaluation: No evidence of DVT seen on physical exam. Negative Homan's sign.   CBC Latest Ref Rng 10/04/2015 10/03/2015 01/30/2015  WBC 4.0 - 10.5 K/uL 8.1 9.1 6.3  Hemoglobin 12.0 - 15.0 g/dL 0.4(V8.7(L) 11.6(L) 12.4  Hematocrit 36.0 - 46.0 % 26.6(L) 34.5(L) 37.0  Platelets 150 - 400 K/uL 128(L) 157 180     Assessment/Plan: Status post vaginal delivery day 1. No GBS prophylaxis due to rapid labor Stable Continue current care. Plan for discharge tomorrow  Rxs for Ibuprophen, Fe, and Percocet already generated--Percocet Rx to patient.   Nyra CapesLATHAM, VICKICNM 10/04/2015, 8:54 AM

## 2015-10-04 NOTE — Discharge Summary (Signed)
OB Discharge Summary     Patient Name: Theresa Horne DOB: 1991-06-10 MRN: [id number]  Date of admission: 10/03/2015 Delivering MD: Gerrit Heck   Date of discharge: 10/04/2015  Admitting diagnosis: 40wks, CTX Intrauterine pregnancy: [redacted]w[redacted]d     Secondary diagnosis:  Principal Problem:   SVD (spontaneous vaginal delivery) Active Problems:   Active labor at term   Second-degree perineal laceration, with delivery   Group beta Strep positive   Rubella nonimmune status, delivered, current hospitalization   Anemia  Additional problems: None     Discharge diagnosis: Term Pregnancy Delivered, precipitous labor                                                                                                Post partum procedures:None  Augmentation: None  Complications: None  Hospital course:  Onset of Labor With Vaginal Delivery     24 y.o. yo [id number] at [redacted]w[redacted]d was admitted in Active Laboron 10/03/2015. Patient had an uncomplicated labor course as follows:  Membrane Rupture Time/Date: 2:30 AM ,10/03/2015   Intrapartum Procedures: Episiotomy: None [1]                                         Lacerations:  2nd degree [3];Perineal [11]  Patient had a delivery of a Viable infant. 10/03/2015  Information for the patient's newborn:  Bertice, Risse [[id number]]  Delivery Method: Vaginal, Spontaneous Delivery (Filed from Delivery Summary)  Patient presented in active labor, with rapid progression to delivery after arrival.  She did not receive any GBS prophylaxis prior to delivery.  Pateint had an uncomplicated postpartum course.  She is ambulating, tolerating a regular diet, passing flatus, and urinating well. Patient is discharged home in stable condition on 10/04/2015 12:30 PM.    Physical exam  Filed Vitals:   10/03/15 0900 10/03/15 1543 10/03/15 1822 10/04/15 0502  BP: 127/73 119/63 135/76 126/68  Pulse: 80 82 86 87  Temp: 98.4 F (36.9 C) 98.2 F (36.8 C) 98.4 F (36.9  C) 98.4 F (36.9 C)  TempSrc: Oral Oral Oral Oral  Resp: Height:      Weight:      SpO2:       General: alert Lochia: appropriate Uterine Fundus: firm Incision: Healing well with no significant drainage DVT Evaluation: No evidence of DVT seen on physical exam. Negative Homan's sign. Labs: Lab Results  Component Value Date   WBC 8.1 10/04/2015   HGB 8.7* 10/04/2015   HCT 26.6* 10/04/2015   MCV 86.9 10/04/2015   PLT 128* 10/04/2015   CMP Latest Ref Rng 12/16/2014  Glucose 70 - 99 mg/dL 87  BUN 6 - 23 mg/dL 9  Creatinine 1.30 - 8.65 mg/dL 7.84  Sodium 696 - 295 mmol/L 139  Potassium 3.5 - 5.1 mmol/L 3.7  Chloride 96 - 112 mEq/L 102  CO2 19 - 32 mmol/L 30  Calcium 8.4 - 10.5 mg/dL 9.4  Total Protein 6.0 - 8.3 g/dL 7.5  Total Bilirubin  0.3 - 1.2 mg/dL 0.6  Alkaline Phos 39 - 117 U/L 55  AST 0 - 37 U/L 17  ALT 0 - 35 U/L 14    Discharge instruction: per After Visit Summary and "Baby and Me Booklet".  After visit meds:    Medication List    TAKE these medications        ferrous sulfate 325 (65 FE) MG tablet  Take 1 tablet (325 mg total) by mouth daily with breakfast.     ibuprofen 600 MG tablet  Commonly known as:  ADVIL,MOTRIN  Take 1 tablet (600 mg total) by mouth every 6 (six) hours as needed.     oxyCODONE-acetaminophen 5-325 MG tablet  Commonly known as:  PERCOCET/ROXICET  Take 1 tablet by mouth every 4 (four) hours as needed (for pain scale 4-7).     prenatal multivitamin Tabs tablet  Take 1 tablet by mouth daily at 12 noon.        Diet: routine diet  Activity: Advance as tolerated. Pelvic rest for 6 weeks.   Outpatient follow up:6 weeks Follow up Appt:No future appointments. Follow up Visit:No Follow-up on file.  Postpartum contraception: None  Newborn Data: Live born female  Birth Weight: 6 lb 4.4 oz (2845 g) APGAR: 9, 9  Baby Feeding: Breast Disposition:rooming in due to no GBS prophylaxis prior to  delivery.   10/04/2015 Nigel BridgemanLATHAM, Haile Toppins, CNM

## 2015-10-05 ENCOUNTER — Ambulatory Visit: Payer: Self-pay

## 2015-10-05 NOTE — Lactation Note (Signed)
This note was copied from the chart of Girl [place]Brittany Carlo. Lactation Consultation Note  Patient Name: Girl Christy GentlesBrittany Perleberg AVWUJ'WToday's Date: 10/05/2015 Reason for consult: Follow-up assessment Mom reports baby has been breastfeeding during the night and latching well. Mom does however report some cracking on her nipples. Care for sore nipples reviewed with Mom, comfort gels given with instructions. Baby asleep at this visit, recently had bottle with formula. Offered to assist Mom with latch at next feeding, Mom declined. LC reviewed basic teaching with Mom, advised baby should be at the breast 8-12 times in 24 hours and with feeding ques. Engorgement care reviewed with Mom if needed. She reports have breast pump at home. Mom denies other questions/concerns. Advised of OP services and support group.   Maternal Data    Feeding Feeding Type: Bottle Fed - Formula Nipple Type: Slow - flow Length of feed: 60 min  LATCH Score/Interventions                      Lactation Tools Discussed/Used Tools: Pump;Comfort gels Breast pump type: Double-Electric Breast Pump   Consult Status Consult Status: Complete Date: 10/05/15 Follow-up type: In-patient    Alfred LevinsGranger, Deniese Oberry Ann 10/05/2015, 11:36 AM

## 2015-10-25 ENCOUNTER — Emergency Department (HOSPITAL_COMMUNITY): Payer: Medicaid Other

## 2015-10-25 ENCOUNTER — Emergency Department (HOSPITAL_COMMUNITY)
Admission: EM | Admit: 2015-10-25 | Discharge: 2015-10-25 | Disposition: A | Discharge status: Home / Self Care | Payer: Medicaid Other | Attending: Emergency Medicine | Admitting: Emergency Medicine

## 2015-10-25 ENCOUNTER — Encounter (HOSPITAL_COMMUNITY): Payer: Self-pay | Admitting: Emergency Medicine

## 2015-10-25 DIAGNOSIS — R0602 Shortness of breath: Secondary | ICD-10-CM | POA: Insufficient documentation

## 2015-10-25 DIAGNOSIS — Z87891 Personal history of nicotine dependence: Secondary | ICD-10-CM | POA: Diagnosis not present

## 2015-10-25 DIAGNOSIS — Z79899 Other long term (current) drug therapy: Secondary | ICD-10-CM | POA: Diagnosis not present

## 2015-10-25 DIAGNOSIS — R079 Chest pain, unspecified: Secondary | ICD-10-CM | POA: Diagnosis not present

## 2015-10-25 DIAGNOSIS — Z3202 Encounter for pregnancy test, result negative: Secondary | ICD-10-CM | POA: Diagnosis not present

## 2015-10-25 DIAGNOSIS — I10 Essential (primary) hypertension: Secondary | ICD-10-CM | POA: Diagnosis not present

## 2015-10-25 LAB — COMPREHENSIVE METABOLIC PANEL
ALBUMIN: 3.6 g/dL (ref 3.5–5.0)
ALK PHOS: 65 U/L (ref 38–126)
ALT: 16 U/L (ref 14–54)
AST: 19 U/L (ref 15–41)
Anion gap: 9 (ref 5–15)
BILIRUBIN TOTAL: 0.4 mg/dL (ref 0.3–1.2)
BUN: 7 mg/dL (ref 6–20)
CALCIUM: 9.3 mg/dL (ref 8.9–10.3)
CO2: 21 mmol/L — ABNORMAL LOW (ref 22–32)
CREATININE: 0.98 mg/dL (ref 0.44–1.00)
Chloride: 108 mmol/L (ref 101–111)
GFR calc Af Amer: >60 mL/min (ref >60)
GLUCOSE: 82 mg/dL (ref 65–99)
Potassium: 3.7 mmol/L (ref 3.5–5.1)
Sodium: 138 mmol/L (ref 135–145)
TOTAL PROTEIN: 6.8 g/dL (ref 6.5–8.1)

## 2015-10-25 LAB — I-STAT TROPONIN, ED: TROPONIN I, POC: 0 ng/mL (ref 0.00–0.08)

## 2015-10-25 LAB — CBC WITH DIFFERENTIAL/PLATELET
BASOS ABS: 0 10*3/uL (ref 0.0–0.1)
BASOS PCT: 1 %
Eosinophils Absolute: 0.1 10*3/uL (ref 0.0–0.7)
Eosinophils Relative: 3 %
HCT: 34.1 % — ABNORMAL LOW (ref 36.0–46.0)
HEMOGLOBIN: 11.2 g/dL — AB (ref 12.0–15.0)
LYMPHS PCT: 32 %
Lymphs Abs: 1.5 10*3/uL (ref 0.7–4.0)
MCH: 28.4 pg (ref 26.0–34.0)
MCHC: 32.8 g/dL (ref 30.0–36.0)
MCV: 86.3 fL (ref 78.0–100.0)
MONO ABS: 0.3 10*3/uL (ref 0.1–1.0)
MONOS PCT: 6 %
NEUTROS ABS: 2.7 10*3/uL (ref 1.7–7.7)
NEUTROS PCT: 58 %
Platelets: 198 10*3/uL (ref 150–400)
RBC: 3.95 MIL/uL (ref 3.87–5.11)
RDW: 14.1 % (ref 11.5–15.5)
WBC: 4.6 10*3/uL (ref 4.0–10.5)

## 2015-10-25 LAB — URINALYSIS, ROUTINE W REFLEX MICROSCOPIC
Bilirubin Urine: NEGATIVE
GLUCOSE, UA: NEGATIVE mg/dL
Ketones, ur: NEGATIVE mg/dL
NITRITE: NEGATIVE
PH: 6.5 (ref 5.0–8.0)
Protein, ur: NEGATIVE mg/dL
SPECIFIC GRAVITY, URINE: 1.011 (ref 1.005–1.030)

## 2015-10-25 LAB — URINE MICROSCOPIC-ADD ON

## 2015-10-25 LAB — POC URINE PREG, ED: Preg Test, Ur: NEGATIVE

## 2015-10-25 MED ORDER — KETOROLAC TROMETHAMINE 30 MG/ML IJ SOLN
30.0000 mg | Freq: Once | INTRAMUSCULAR | Status: AC
  Administered 2015-10-25: 30 mg via INTRAVENOUS
  Filled 2015-10-25: qty 1

## 2015-10-25 MED ORDER — IOHEXOL 350 MG/ML SOLN
100.0000 mL | Freq: Once | INTRAVENOUS | Status: AC | PRN
  Administered 2015-10-25: 80 mL via INTRAVENOUS

## 2015-10-25 NOTE — ED Notes (Signed)
Pt reports new onset central chest pain since 10am with no radiation or associated symptoms.

## 2015-10-25 NOTE — ED Provider Notes (Signed)
CSN: [id number]     Arrival date & time 10/25/15  1101 History   First MD Initiated Contact with Patient 10/25/15 1110     Chief Complaint  Patient presents with  . Chest Pain     (Consider location/radiation/quality/duration/timing/severity/associated sxs/prior Treatment) HPI Comments: 24 y.o. Female with history of HTN, 3 weeks postpartum presents for chest pain.  The patient reports that around 10 AM this morning she developed pain in the middle of her chest that was pressure like and which made it feel hard to breathe.  She denies shortness of breath or chest pain at this time.  No fevers or chills.  No leg swelling.  No palpitations.  She denies ever having symptoms like this before.    Patient is a 24 y.o. female presenting with chest pain.  Chest Pain Associated symptoms: shortness of breath   Associated symptoms: no abdominal pain, no back pain, no cough, no dizziness, no fatigue, no fever, no headache, no nausea, no palpitations, not vomiting and no weakness     Past Medical History  Diagnosis Date  . Hypertension   . Preterm labor    Past Surgical History  Procedure Laterality Date  . Induced abortion     Family History  Problem Relation Age of Onset  . Hypertension Father   . Diabetes Paternal Uncle   . Renal Disease Maternal Grandmother    Social History  Substance Use Topics  . Smoking status: Former Smoker -- 0.25 packs/day    Types: Cigarettes  . Smokeless tobacco: Never Used  . Alcohol Use: Yes     Comment: social use before preg   OB History    Gravida Para Term Preterm AB TAB SAB Ectopic Multiple Living   0 2     Review of Systems  Constitutional: Negative for fever, chills, appetite change and fatigue.  HENT: Negative for congestion, rhinorrhea, sinus pressure and sneezing.   Eyes: Negative for pain, redness and visual disturbance.  Respiratory: Positive for shortness of breath. Negative for cough, chest tightness and wheezing.    Cardiovascular: Positive for chest pain. Negative for palpitations and leg swelling.  Gastrointestinal: Negative for nausea, vomiting, abdominal pain, diarrhea and abdominal distention.  Genitourinary: Negative for dysuria, urgency, hematuria and flank pain.  Musculoskeletal: Negative for myalgias and back pain.  Skin: Negative for rash.  Neurological: Negative for dizziness, weakness, light-headedness and headaches.  Hematological: Does not bruise/bleed easily.      Allergies  Review of patient's allergies indicates no known allergies.  Home Medications   Prior to Admission medications   Medication Sig Start Date End Date Taking? Authorizing Provider  oxyCODONE-acetaminophen (PERCOCET/ROXICET) 5-325 MG tablet Take 1 tablet by mouth every 4 (four) hours as needed (for pain scale 4-7). 10/04/15  Yes Nigel Bridgeman, CNM  ferrous sulfate 325 (65 FE) MG tablet Take 1 tablet (325 mg total) by mouth daily with breakfast. Patient not taking: Reported on 10/25/2015 10/04/15   Nigel Bridgeman, CNM  ibuprofen (ADVIL,MOTRIN) 600 MG tablet Take 1 tablet (600 mg total) by mouth every 6 (six) hours as needed. Patient not taking: Reported on 10/25/2015 10/04/15   Nigel Bridgeman, CNM  Prenatal Vit-Fe Fumarate-FA (PRENATAL MULTIVITAMIN) TABS tablet Take 1 tablet by mouth daily at 12 noon.    Historical Provider, MD   BP 128/87 mmHg  Pulse 58  Temp(Src) 98 F (36.7 C)  Resp 21  Ht  (1.626 m)  Wt 223 lb (101.152  kg)  BMI 38.26 kg/m2  SpO2 100%  LMP  Physical Exam  Constitutional: She is oriented to person, place, and time. She appears well-developed and well-nourished. No distress.  HENT:  Head: Normocephalic and atraumatic.  Right Ear: External ear normal.  Left Ear: External ear normal.  Nose: Nose normal.  Mouth/Throat: Oropharynx is clear and moist. No oropharyngeal exudate.  Eyes: EOM are normal. Pupils are equal, round, and reactive to light.  Neck: Normal range of motion. Neck supple.   Cardiovascular: Normal rate, regular rhythm, normal heart sounds and intact distal pulses.   No murmur heard. Pulmonary/Chest: Effort normal. No respiratory distress. She has no wheezes. She has no rales. She exhibits no tenderness.  Abdominal: Soft. She exhibits no distension. There is no tenderness.  Musculoskeletal: Normal range of motion. She exhibits no edema or tenderness.  Neurological: She is alert and oriented to person, place, and time.  Skin: Skin is warm and dry. No rash noted. She is not diaphoretic.  Vitals reviewed.   ED Course  Procedures (including critical care time) Labs Review Labs Reviewed  CBC WITH DIFFERENTIAL/PLATELET - Abnormal; Notable for the following:    Hemoglobin 11.2 (*)    HCT 34.1 (*)    All other components within normal limits  URINALYSIS, ROUTINE W REFLEX MICROSCOPIC (NOT AT [facility]) - Abnormal; Notable for the following:    APPearance CLOUDY (*)    Hgb urine dipstick MODERATE (*)    Leukocytes, UA MODERATE (*)    All other components within normal limits  COMPREHENSIVE METABOLIC PANEL - Abnormal; Notable for the following:    CO2 21 (*)    All other components within normal limits  URINE MICROSCOPIC-ADD ON - Abnormal; Notable for the following:    Squamous Epithelial / LPF 6-30 (*)    Bacteria, UA RARE (*)    All other components within normal limits  URINE CULTURE  I-STAT TROPOININ, ED  POC URINE PREG, ED    Imaging Review Dg Chest 2 View  10/25/2015  CLINICAL DATA:  Chest pain EXAM: CHEST  2 VIEW COMPARISON:  05/13/2014 FINDINGS: The heart size and mediastinal contours are within normal limits. Both lungs are clear. The visualized skeletal structures are unremarkable. IMPRESSION: No active cardiopulmonary disease. Electronically Signed   By: Natasha Mead M.D.   On: 10/25/2015 12:20   Ct Angio Chest Pe W/cm &/or Wo Cm  10/25/2015  CLINICAL DATA:  24 year old female 3 weeks postpartum with acute onset of chest pain at 10 a.m. this morning.  Evaluate for potential pulmonary embolism. EXAM: CT ANGIOGRAPHY CHEST WITH CONTRAST TECHNIQUE: Multidetector CT imaging of the chest was performed using the standard protocol during bolus administration of intravenous contrast. Multiplanar CT image reconstructions and MIPs were obtained to evaluate the vascular anatomy. CONTRAST:  80mL OMNIPAQUE IOHEXOL 350 MG/ML SOLN COMPARISON:  No priors. FINDINGS: Mediastinum/Lymph Nodes: No filling defects within the pulmonary arterial tree to suggest underlying pulmonary embolism. Heart size is mildly enlarged with mild left ventricular dilatation (left ventricular diameter of approximately 56 mm on today's non gated CT examination). There is no significant pericardial fluid, thickening or pericardial calcification. No pathologically enlarged mediastinal or hilar lymph nodes. Esophagus is unremarkable in appearance. No axillary lymphadenopathy. Lungs/Pleura: No acute consolidative airspace disease. No pleural effusions. 3 mm subpleural nodule in the periphery of the right middle lobe (image 68 of series 5), statistically benign in this young patient, presumably a subpleural lymph node. No larger more suspicious appearing pulmonary nodules or masses. No  pneumothorax. Upper Abdomen: Unremarkable. Musculoskeletal/Soft Tissues: There are no aggressive appearing lytic or blastic lesions noted in the visualized portions of the skeleton. Review of the MIP images confirms the above findings. IMPRESSION: 1. No evidence of pulmonary embolism. 2. No acute findings in the thorax to account for the patient's symptoms. 3. Mild cardiomegaly. Electronically Signed   By: Trudie Reedaniel  Entrikin M.D.   On: 10/25/2015 14:18   I have personally reviewed and evaluated these images and lab results as part of my medical decision-making.   EKG Interpretation   Date/Time:  Sunday October 25 2015 11:09:34 EST Ventricular Rate:  79 PR Interval:  98 QRS Duration: 66 QT Interval:  398 QTC  Calculation: 456 R Axis:   85 Text Interpretation:  Sinus rhythm with marked sinus arrhythmia with short  PR with occasional Premature ventricular complexes and Premature atrial  complexes Nonspecific ST and T wave abnormality Abnormal ECG No previous  ECGs available Confirmed by NGUYEN, EMILY ([id number]) on 10/25/2015 11:11:44  AM      MDM  Patient seen and evaluated in stable condition.  EKG with sinus arrhythmia but no other acute finding. tropnin normal.  Chest xray unremarkable.  CTA negative for acute process/PE.  No signs of cardiomyopathy or fluid overload.  Patient with normal vitals.  Discussed results as well as sinus arrhythmia and need for follow up with patient who expressed understanding and agreement.  Patient was discharged home in stable condition with strict return precautions.  Final diagnoses:  Chest pain, unspecified chest pain type    1. Chest pain    Leta BaptistEmily Roe Nguyen, MD 10/27/15 Earle [id number]

## 2015-10-25 NOTE — Discharge Instructions (Signed)
The cause of your chest pain is unclear at this time.  The rhythm of your heart was mildly different than we would expect.  This should be follow up with your primary care physician.  Return with sudden worsening of symptoms.  Rest and take Tylenol/Motrin as needed.  Nonspecific Chest Pain  Chest pain can be caused by many different conditions. There is always a chance that your pain could be related to something serious, such as a heart attack or a blood clot in your lungs. Chest pain can also be caused by conditions that are not life-threatening. If you have chest pain, it is very important to follow up with your health care provider. CAUSES  Chest pain can be caused by:  Heartburn.  Pneumonia or bronchitis.  Anxiety or stress.  Inflammation around your heart (pericarditis) or lung (pleuritis or pleurisy).  A blood clot in your lung.  A collapsed lung (pneumothorax). It can develop suddenly on its own (spontaneous pneumothorax) or from trauma to the chest.  Shingles infection (varicella-zoster virus).  Heart attack.  Damage to the bones, muscles, and cartilage that make up your chest wall. This can include:  Bruised bones due to injury.  Strained muscles or cartilage due to frequent or repeated coughing or overwork.  Fracture to one or more ribs.  Sore cartilage due to inflammation (costochondritis). RISK FACTORS  Risk factors for chest pain may include:  Activities that increase your risk for trauma or injury to your chest.  Respiratory infections or conditions that cause frequent coughing.  Medical conditions or overeating that can cause heartburn.  Heart disease or family history of heart disease.  Conditions or health behaviors that increase your risk of developing a blood clot.  Having had chicken pox (varicella zoster). SIGNS AND SYMPTOMS Chest pain can feel like:  Burning or tingling on the surface of your chest or deep in your chest.  Crushing, pressure,  aching, or squeezing pain.  Dull or sharp pain that is worse when you move, cough, or take a deep breath.  Pain that is also felt in your back, neck, shoulder, or arm, or pain that spreads to any of these areas. Your chest pain may come and go, or it may stay constant. DIAGNOSIS Lab tests or other studies may be needed to find the cause of your pain. Your health care provider may have you take a test called an ambulatory ECG (electrocardiogram). An ECG records your heartbeat patterns at the time the test is performed. You may also have other tests, such as:  Transthoracic echocardiogram (TTE). During echocardiography, sound waves are used to create a picture of all of the heart structures and to look at how blood flows through your heart.  Transesophageal echocardiogram (TEE).This is a more advanced imaging test that obtains images from inside your body. It allows your health care provider to see your heart in finer detail.  Cardiac monitoring. This allows your health care provider to monitor your heart rate and rhythm in real time.  Holter monitor. This is a portable device that records your heartbeat and can help to diagnose abnormal heartbeats. It allows your health care provider to track your heart activity for several days, if needed.  Stress tests. These can be done through exercise or by taking medicine that makes your heart beat more quickly.  Blood tests.  Imaging tests. TREATMENT  Your treatment depends on what is causing your chest pain. Treatment may include:  Medicines. These may include:  Acid blockers  for heartburn.  Anti-inflammatory medicine.  Pain medicine for inflammatory conditions.  Antibiotic medicine, if an infection is present.  Medicines to dissolve blood clots.  Medicines to treat coronary artery disease.  Supportive care for conditions that do not require medicines. This may include:  Resting.  Applying heat or cold packs to injured  areas.  Limiting activities until pain decreases. HOME CARE INSTRUCTIONS  If you were prescribed an antibiotic medicine, finish it all even if you start to feel better.  Avoid any activities that bring on chest pain.  Do not use any tobacco products, including cigarettes, chewing tobacco, or electronic cigarettes. If you need help quitting, ask your health care provider.  Do not drink alcohol.  Take medicines only as directed by your health care provider.  Keep all follow-up visits as directed by your health care provider. This is important. This includes any further testing if your chest pain does not go away.  If heartburn is the cause for your chest pain, you may be told to keep your head raised (elevated) while sleeping. This reduces the chance that acid will go from your stomach into your esophagus.  Make lifestyle changes as directed by your health care provider. These may include:  Getting regular exercise. Ask your health care provider to suggest some activities that are safe for you.  Eating a heart-healthy diet. A registered dietitian can help you to learn healthy eating options.  Maintaining a healthy weight.  Managing diabetes, if necessary.  Reducing stress. SEEK MEDICAL CARE IF:  Your chest pain does not go away after treatment.  You have a rash with blisters on your chest.  You have a fever. SEEK IMMEDIATE MEDICAL CARE IF:   Your chest pain is worse.  You have an increasing cough, or you cough up blood.  You have severe abdominal pain.  You have severe weakness.  You faint.  You have chills.  You have sudden, unexplained chest discomfort.  You have sudden, unexplained discomfort in your arms, back, neck, or jaw.  You have shortness of breath at any time.  You suddenly start to sweat, or your skin gets clammy.  You feel nauseous or you vomit.  You suddenly feel light-headed or dizzy.  Your heart begins to beat quickly, or it feels like it  is skipping beats. These symptoms may represent a serious problem that is an emergency. Do not wait to see if the symptoms will go away. Get medical help right away. Call your local emergency services (911 in the [place] Do not drive yourself to the hospital.   This information is not intended to replace advice given to you by your health care provider. Make sure you discuss any questions you have with your health care provider.   Document Released: 08/24/2005 Document Revised: 12/05/2014 Document Reviewed: 06/20/2014 Elsevier Interactive Patient Education [employer].

## 2015-10-26 LAB — URINE CULTURE

## 2015-10-27 ENCOUNTER — Ambulatory Visit: Payer: Medicaid Other | Attending: Physician Assistant | Admitting: Physician Assistant

## 2015-10-27 VITALS — BP 121/77 | HR 76 | Temp 98.2°F | Resp 20 | Ht 64.0 in | Wt 225.2 lb

## 2015-10-27 DIAGNOSIS — Z3A00 Weeks of gestation of pregnancy not specified: Secondary | ICD-10-CM | POA: Insufficient documentation

## 2015-10-27 DIAGNOSIS — O9989 Other specified diseases and conditions complicating pregnancy, childbirth and the puerperium: Secondary | ICD-10-CM | POA: Diagnosis not present

## 2015-10-27 DIAGNOSIS — Z09 Encounter for follow-up examination after completed treatment for conditions other than malignant neoplasm: Secondary | ICD-10-CM | POA: Diagnosis not present

## 2015-10-27 DIAGNOSIS — R079 Chest pain, unspecified: Secondary | ICD-10-CM | POA: Diagnosis not present

## 2015-10-27 DIAGNOSIS — I10 Essential (primary) hypertension: Secondary | ICD-10-CM | POA: Insufficient documentation

## 2015-10-27 NOTE — Progress Notes (Signed)
   Theresa GentlesBrittany Lindstrom  ZOX:[id number]  [id number]:[id number]  DOB - [birth year]  Chief Complaint  Patient presents with  . Hospitalization Follow-up       Subjective:   Theresa GentlesBrittany Dockham is a 24 y.o. female here today for establishment of care. She is 3 weeks postpartum. She delivered a healthy baby girl on 10/03/2015. She presented to the emergency department on 10/25/2015 with complaints of chest pain. She had a mid chest pressure or like someone was grabbing her chest. This was associated with difficulty taking deep breaths. Her hemoglobin was 11.2. Her chest x-ray was negative. A CT angiogram of the chest was negative. Her EKG showed sinus rhythm with PACs occurring in a bigeminal pattern. No ST-T wave changes. She's presently chest pain-free. Her breathing is normal as well. No syncope. No recurrence of symptoms.  ROS: GEN: denies fever or chills, denies change in weight LUNGS: denies SHOB, dyspnea, PND, orthopnea CV: denies CP or palpitations ABD: denies abd pain, N or V   ALLERGIES: No Known Allergies  PAST MEDICAL HISTORY: Past Medical History  Diagnosis Date  . Hypertension   . Preterm labor     PAST SURGICAL HISTORY: Past Surgical History  Procedure Laterality Date  . Induced abortion      MEDICATIONS AT HOME: Prior to Admission medications   Medication Sig Start Date End Date Taking? Authorizing Provider  ibuprofen (ADVIL,MOTRIN) 600 MG tablet Take 1 tablet (600 mg total) by mouth every 6 (six) hours as needed. 10/04/15  Yes Nigel BridgemanVicki Latham, CNM  ferrous sulfate 325 (65 FE) MG tablet Take 1 tablet (325 mg total) by mouth daily with breakfast. Patient not taking: Reported on 10/25/2015 10/04/15   Nigel BridgemanVicki Latham, CNM  oxyCODONE-acetaminophen (PERCOCET/ROXICET) 5-325 MG tablet Take 1 tablet by mouth every 4 (four) hours as needed (for pain scale 4-7). Patient not taking: Reported on 10/27/2015 10/04/15   Nigel BridgemanVicki Latham, CNM  Prenatal Vit-Fe Fumarate-FA (PRENATAL MULTIVITAMIN)  TABS tablet Take 1 tablet by mouth daily at 12 noon.    Historical Provider, MD     Objective:   Filed Vitals:   10/27/15 1401  BP: 121/77  Pulse: 76  Temp: 98.2 F (36.8 C)  TempSrc: Oral  Resp: 20  Height: 5\' 4"  (1.626 m)  Weight: 225 lb 3.2 oz (102.15 kg)  SpO2: 98%    Exam General appearance : Awake, alert, not in any distress. Speech Clear. Not toxic looking Neck: supple, no JVD. No cervical lymphadenopathy.  Chest:Good air entry bilaterally, no added sounds  CVS: S1 S2 regular, no murmurs.  Abdomen: Bowel sounds present, Non tender and not distended with no gaurding, rigidity or rebound.    Assessment & Plan  Assessment: 1. Chest pain/ etiology, non recurrent 2. 3 weeks Post partum  Plan: No change to meds Encourage risk factor modification Keep appt with OB in December 6 weeks here for routine health maintenance   Return in about 6 weeks (around 12/08/2015).  The patient was given clear instructions to go to ER or return to medical center if symptoms don't improve, worsen or new problems develop. The patient verbalized understanding. The patient was told to call to get lab results if they haven't heard anything in the next week.   This note has been created with Education officer, environmentalDragon speech recognition software and smart phrase technology. Any transcriptional errors are unintentional.    Scot Juniffany Lianni Kanaan, PA-C [facility] [place], [place] [phone]   10/27/2015, 2:23 PM

## 2015-10-27 NOTE — Progress Notes (Signed)
Patient here for HFU for chest pain  Patient denies pain at this time.  Patient denies SOB, wheezing, and swelling.

## 2015-12-08 NOTE — Progress Notes (Signed)
Post discharge chart review completed.  

## 2016-03-08 ENCOUNTER — Emergency Department (HOSPITAL_COMMUNITY)
Admission: EM | Admit: 2016-03-08 | Discharge: 2016-03-08 | Disposition: A | Discharge status: Home / Self Care | Payer: Medicaid Other | Attending: Emergency Medicine | Admitting: Emergency Medicine

## 2016-03-08 ENCOUNTER — Encounter (HOSPITAL_COMMUNITY): Payer: Self-pay | Admitting: *Deleted

## 2016-03-08 ENCOUNTER — Ambulatory Visit (INDEPENDENT_AMBULATORY_CARE_PROVIDER_SITE_OTHER): Payer: Self-pay | Admitting: Physician Assistant

## 2016-03-08 VITALS — BP 126/84 | HR 71 | Temp 98.1°F | Resp 16 | Ht 64.0 in | Wt 224.0 lb

## 2016-03-08 DIAGNOSIS — H6691 Otitis media, unspecified, right ear: Secondary | ICD-10-CM

## 2016-03-08 DIAGNOSIS — H9201 Otalgia, right ear: Secondary | ICD-10-CM | POA: Insufficient documentation

## 2016-03-08 DIAGNOSIS — I10 Essential (primary) hypertension: Secondary | ICD-10-CM | POA: Insufficient documentation

## 2016-03-08 MED ORDER — AMOXICILLIN 875 MG PO TABS: 875.0000 mg | ORAL_TABLET | Freq: Two times a day (BID) | ORAL | Status: AC

## 2016-03-08 NOTE — ED Notes (Signed)
Patient's mother stated she was taking the patient to Urgent Care because "she's really in a lot of pain and can't wait"

## 2016-03-08 NOTE — Progress Notes (Signed)
[facility] [address], [place] [address] [phone]  Date:  03/08/2016   Name:  Theresa GentlesBrittany Prieur   DOB:  [birth year]   MRN:  [id number]  PCP:  Pcp Not In System    Chief Complaint: Otalgia   History of Present Illness:  This is a 25 y.o. female with PMH hydradenitis who is presenting with right otalgia since waking a few hours ago. States the ear is aching and pain is radiating in her right jaw and mouth. No draining from the ear. She has put a cotton ball in her ear, states that makes the ear feel a little better. Ear feels full and "stopped up". Denies fever, chills, sore throat, cough, nasal congestion. No hx env allergies. Never had ear infections before.  Review of Systems:  Review of Systems See HPI  Patient Active Problem List   Diagnosis Date Noted  . Anemia 10/04/2015  . Abdominal pressure 06/09/2015  . History of preterm delivery, currently pregnant -- delivered at 25 wks in March 2012 06/09/2015  . Hydradenitis 04/16/2012    Prior to Admission medications   Medication Sig Start Date End Date Taking? Authorizing Provider  ferrous sulfate 325 (65 FE) MG tablet Take 1 tablet (325 mg total) by mouth daily with breakfast. Patient not taking: Reported on 10/25/2015 10/04/15   Nigel BridgemanVicki Latham, CNM  ibuprofen (ADVIL,MOTRIN) 600 MG tablet Take 1 tablet (600 mg total) by mouth every 6 (six) hours as needed. Patient not taking: Reported on 03/08/2016 10/04/15   Nigel BridgemanVicki Latham, CNM  oxyCODONE-acetaminophen (PERCOCET/ROXICET) 5-325 MG tablet Take 1 tablet by mouth every 4 (four) hours as needed (for pain scale 4-7). Patient not taking: Reported on 10/27/2015 10/04/15   Nigel BridgemanVicki Latham, CNM  Prenatal Vit-Fe Fumarate-FA (PRENATAL MULTIVITAMIN) TABS tablet Take 1 tablet by mouth daily at 12 noon. Reported on 03/08/2016    Historical Provider, MD    No Known Allergies  Past Surgical History  Procedure Laterality Date  . Induced abortion      Social History   Substance Use Topics  . Smoking status: Former Smoker -- 0.00 packs/day    Types: Cigarettes    Quit date: 01/26/2015  . Smokeless tobacco: Never Used  . Alcohol Use: Yes     Comment: social use before preg    Family History  Problem Relation Age of Onset  . Hypertension Father   . Diabetes Paternal Uncle   . Renal Disease Maternal Grandmother     Medication list has been reviewed and updated.  Physical Examination:  Physical Exam  Constitutional: She is oriented to person, place, and time. She appears well-developed and well-nourished. No distress.  HENT:  Head: Normocephalic and atraumatic.  Right Ear: Hearing, external ear and ear canal normal. Tympanic membrane is erythematous and bulging.  Left Ear: Hearing, tympanic membrane, external ear and ear canal normal.  Nose: Nose normal.  Mouth/Throat: Uvula is midline, oropharynx is clear and moist and mucous membranes are normal.  No pain with manipulation of auricle or tragus No anterior or posterior ear tenderness  Eyes: Conjunctivae and lids are normal. Right eye exhibits no discharge. Left eye exhibits no discharge. No scleral icterus.  Pulmonary/Chest: Effort normal. No respiratory distress.  Musculoskeletal: Normal range of motion.  Lymphadenopathy:       Head (right side): No submental, no submandibular, no tonsillar, no preauricular and no posterior auricular adenopathy present.       Head (left side): No submental, no submandibular, no tonsillar,  no preauricular and no posterior auricular adenopathy present.    She has no cervical adenopathy.  Neurological: She is alert and oriented to person, place, and time.  Skin: Skin is warm, dry and intact. No lesion and no rash noted.  Psychiatric: She has a normal mood and affect. Her speech is normal and behavior is normal. Thought content normal.   BP 126/84 mmHg  Pulse 71  Temp(Src) 98.1 F (36.7 C) (Oral)  Resp 16  Ht  (1.626 m)  Wt 224 lb (101.606 kg)  BMI  38.43 kg/m2  SpO2 98%  LMP 02/26/2016  Assessment and Plan:  1. Acute right otitis media, recurrence not specified, unspecified otitis media type Return in 1 week if symptoms not improving. - amoxicillin (AMOXIL) 875 MG tablet; Take 1 tablet (875 mg total) by mouth 2 (two) times daily.  Dispense: 20 tablet; Refill: 0   [place] [employer]. Dyke Brackett, MHS [facility]  03/08/2016

## 2016-03-08 NOTE — ED Notes (Signed)
Pt is here with right ear pain, no drainage

## 2016-03-08 NOTE — Patient Instructions (Addendum)
Take amoxicillin twice a day for 10 days. Ibuprofen/tylenol for pain. Return in 1 week if symptoms not improving    IF you received an x-ray today, you will receive an invoice from [facility]. Please contact [facility] at [phone] with questions or concerns regarding your invoice.   IF you received labwork today, you will receive an invoice from [facility]. Please contact [facility] at [phone] with questions or concerns regarding your invoice.   Our billing staff will not be able to assist you with questions regarding bills from these companies.  You will be contacted with the lab results as soon as they are available. The fastest way to get your results is to activate your My Chart account. Instructions are located on the last page of this paperwork. If you have not heard from us regarding the results in 2 weeks, please contact this office.

## 2016-07-07 IMAGING — CT CT ANGIO CHEST
2 of 6 series · 18 of 36 positions shown · IV contrast (Omni 300)
Comparison: No priors.

CLINICAL DATA: 24-year-old female 3 weeks postpartum with acute
onset of chest pain at 10 a.m. this morning. Evaluate for potential
pulmonary embolism.

EXAM:
CT ANGIOGRAPHY CHEST WITH CONTRAST
TECHNIQUE: Multidetector CT imaging of the chest was performed using the
standard protocol during bolus administration of intravenous
contrast. Multiplanar CT image reconstructions and MIPs were
obtained to evaluate the vascular anatomy.
CONTRAST:  80mL OMNIPAQUE IOHEXOL 350 MG/ML SOLN

[Series 6: pe thins · axial · 0.66mm/px · z∈[-254,-22]mm · 17 of 514 slices shown]
[im 25/514  lung]
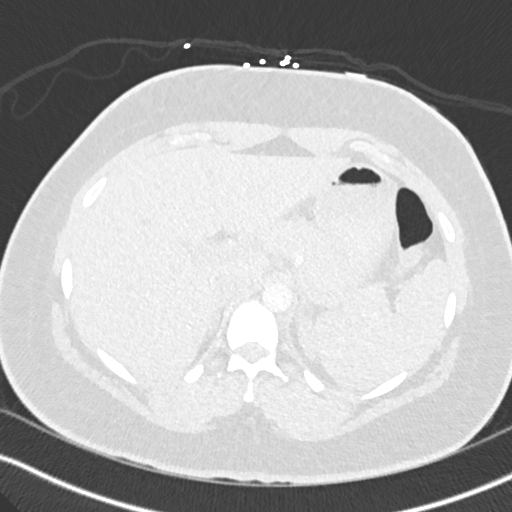
[im 49/514  mediastinal]
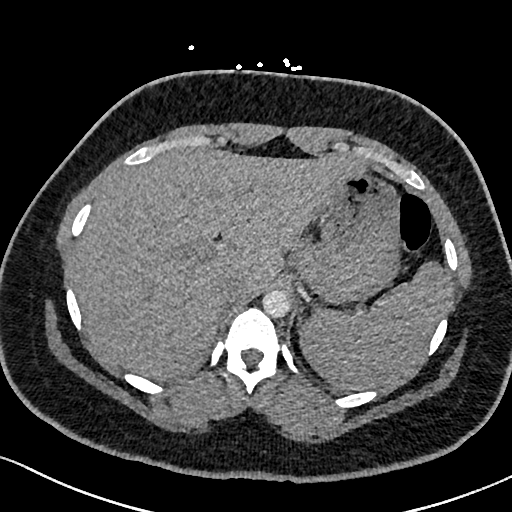
[im 74/514  lung]
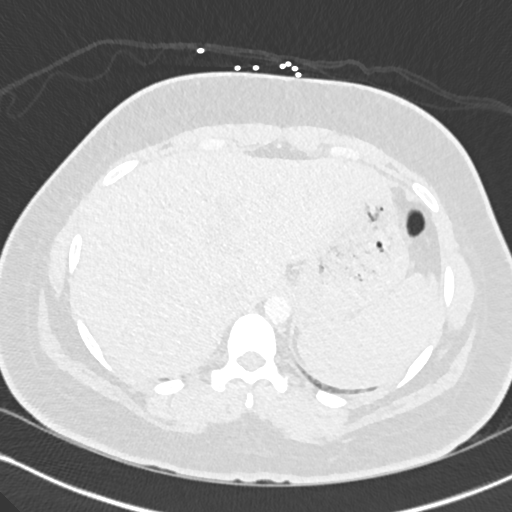
[im 123/514  mediastinal]
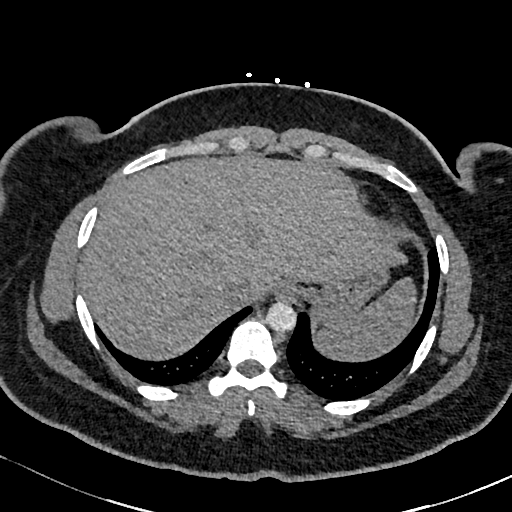
[im 147/514  lung]
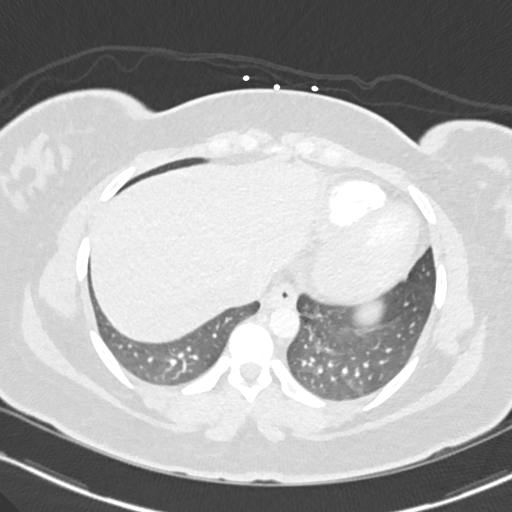
[im 172/514  mediastinal]
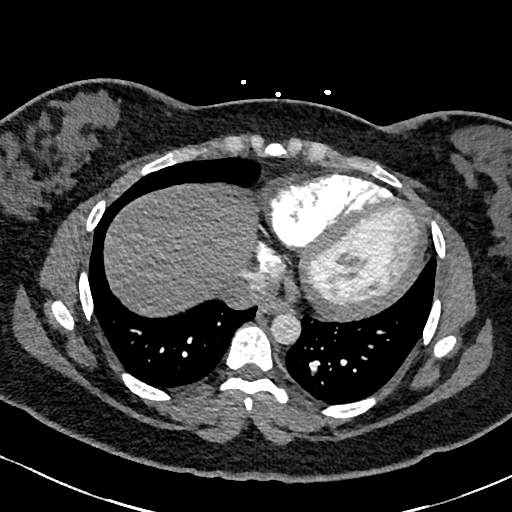
[im 196/514  lung]
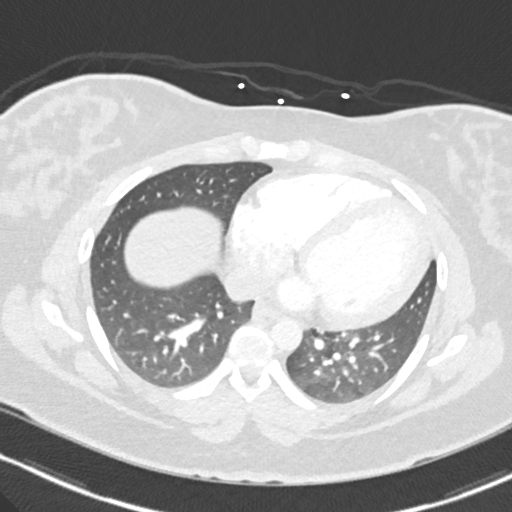
[im 220/514  mediastinal]
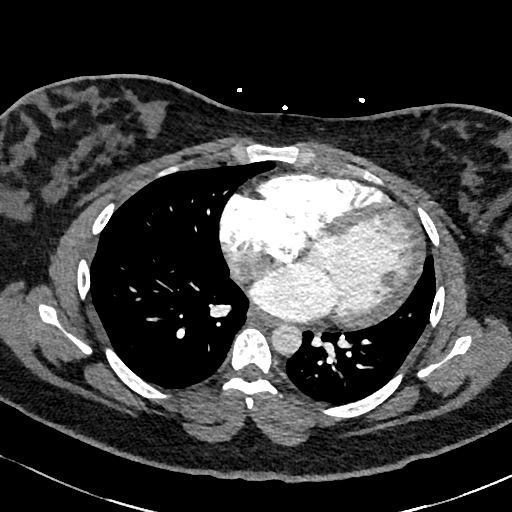
[im 269/514  lung]
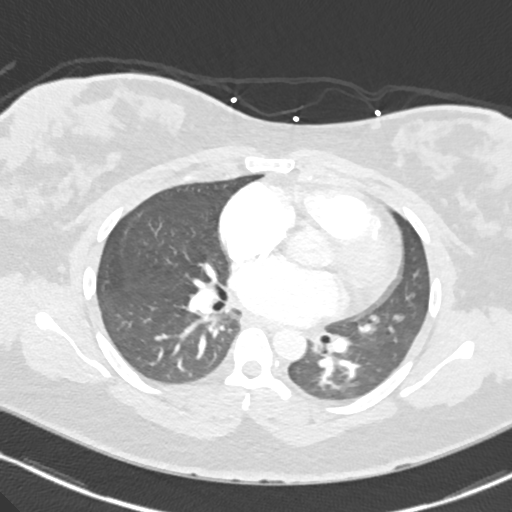
[im 294/514  mediastinal]
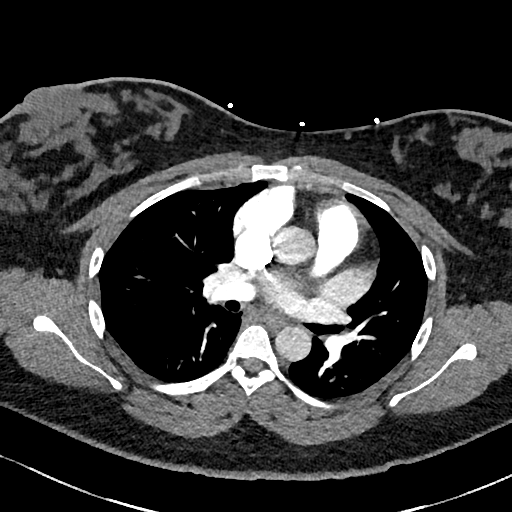
[im 318/514  lung]
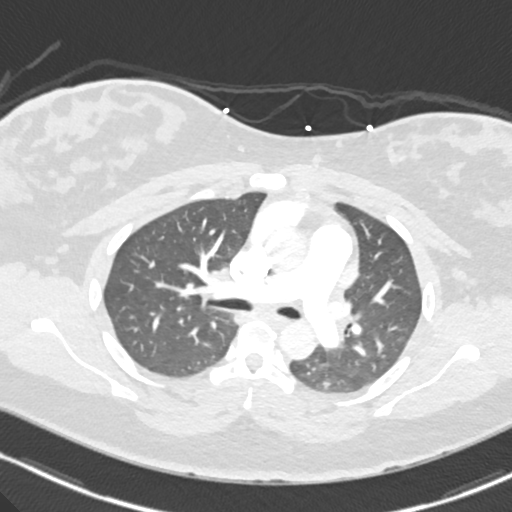
[im 343/514  mediastinal]
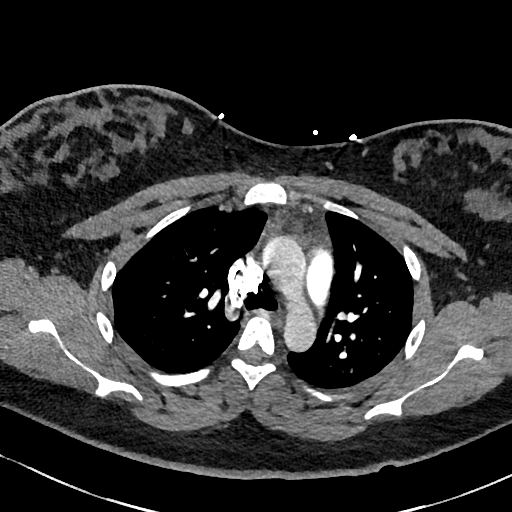
[im 367/514  lung]
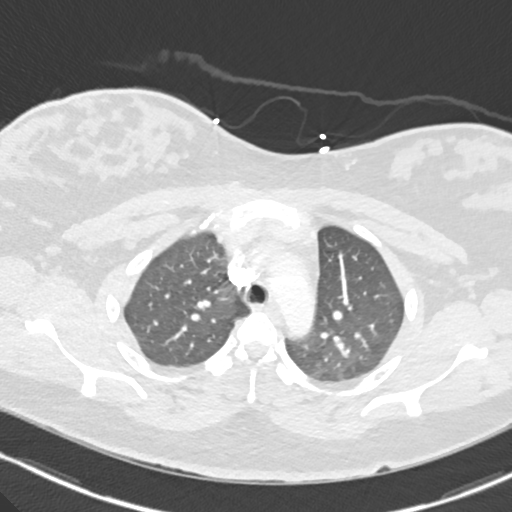
[im 391/514  mediastinal]
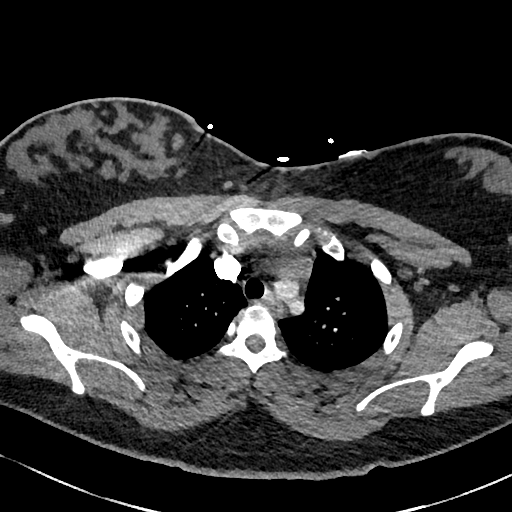
[im 440/514  lung]
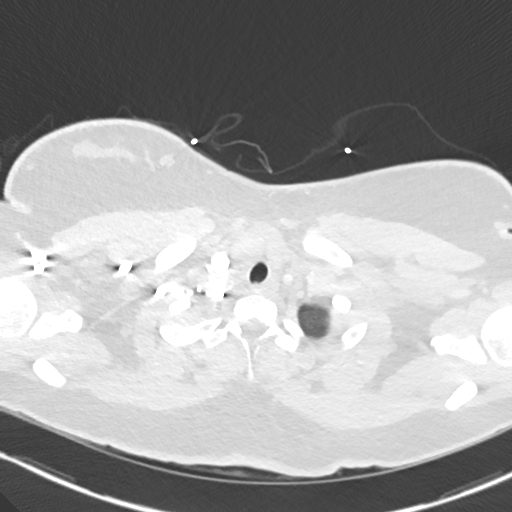
[im 465/514  mediastinal]
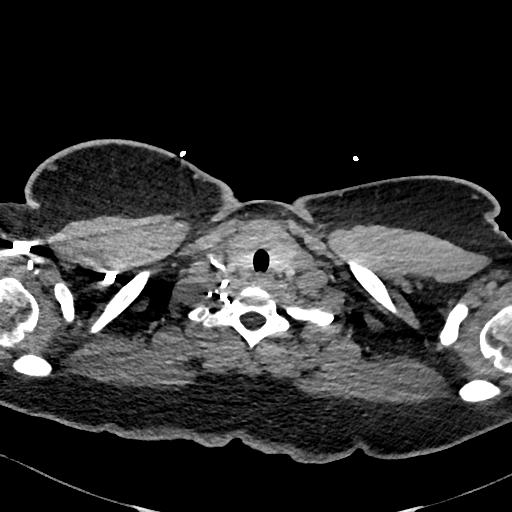
[im 489/514  lung]
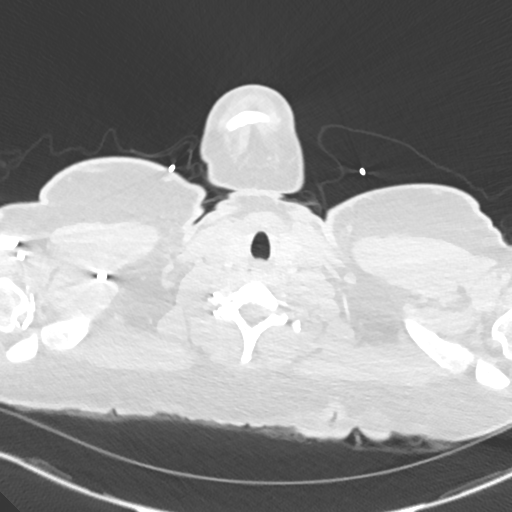

[Series 7: pe 2mm cor · coronal · 0.54mm/px · 1 of 109 slices shown]
[im 55/109  mediastinal]
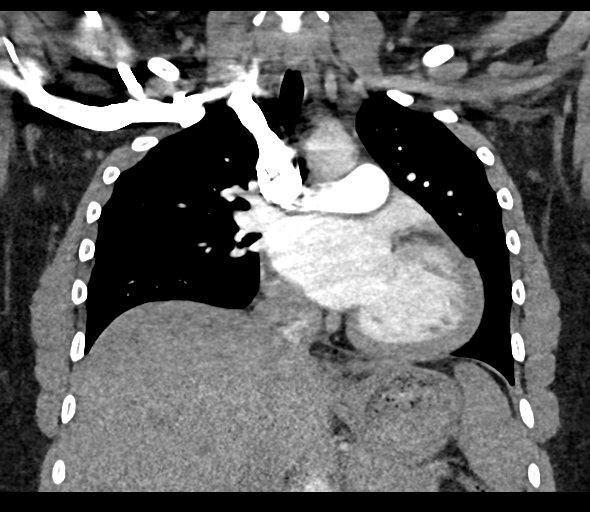

[18 of 36 positions shown; findings below may reference images not displayed]

FINDINGS: Mediastinum/Lymph Nodes: No filling defects within the pulmonary
arterial tree to suggest underlying pulmonary embolism. Heart size
is mildly enlarged with mild left ventricular dilatation (left
ventricular diameter of approximately 56 mm on today's non gated CT
examination). There is no significant pericardial fluid, thickening
or pericardial calcification. No pathologically enlarged mediastinal
or hilar lymph nodes. Esophagus is unremarkable in appearance. No
axillary lymphadenopathy.

Lungs/Pleura: No acute consolidative airspace disease. No pleural
effusions. 3 mm subpleural nodule in the periphery of the right
middle lobe (image 68 of series 5), statistically benign in this
young patient, presumably a subpleural lymph node. No larger more
suspicious appearing pulmonary nodules or masses. No pneumothorax.

Upper Abdomen: Unremarkable.

Musculoskeletal/Soft Tissues: There are no aggressive appearing
lytic or blastic lesions noted in the visualized portions of the
skeleton.

Review of the MIP images confirms the above findings.
IMPRESSION: 1. No evidence of pulmonary embolism.
2. No acute findings in the thorax to account for the patient's
symptoms.
3. Mild cardiomegaly.

## 2016-09-07 ENCOUNTER — Emergency Department (HOSPITAL_COMMUNITY)
Admission: EM | Admit: 2016-09-07 | Discharge: 2016-09-08 | Disposition: A | Discharge status: Home / Self Care | Payer: Medicaid Other | Attending: Emergency Medicine | Admitting: Emergency Medicine

## 2016-09-07 ENCOUNTER — Encounter (HOSPITAL_COMMUNITY): Payer: Self-pay | Admitting: Emergency Medicine

## 2016-09-07 DIAGNOSIS — I10 Essential (primary) hypertension: Secondary | ICD-10-CM | POA: Insufficient documentation

## 2016-09-07 DIAGNOSIS — T7421XA Adult sexual abuse, confirmed, initial encounter: Secondary | ICD-10-CM | POA: Diagnosis not present

## 2016-09-07 DIAGNOSIS — S5011XA Contusion of right forearm, initial encounter: Secondary | ICD-10-CM | POA: Diagnosis not present

## 2016-09-07 DIAGNOSIS — S5012XA Contusion of left forearm, initial encounter: Secondary | ICD-10-CM | POA: Diagnosis not present

## 2016-09-07 DIAGNOSIS — F17210 Nicotine dependence, cigarettes, uncomplicated: Secondary | ICD-10-CM | POA: Insufficient documentation
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
## 2016-09-07 NOTE — ED Notes (Signed)
[employer] at bedside 

## 2016-09-08 ENCOUNTER — Ambulatory Visit (HOSPITAL_COMMUNITY)
Admission: EM | Admit: 2016-09-08 | Discharge: 2016-09-08 | Disposition: A | Discharge status: Home / Self Care | Payer: No Typology Code available for payment source | Attending: Emergency Medicine | Admitting: Emergency Medicine

## 2016-09-08 DIAGNOSIS — I10 Essential (primary) hypertension: Secondary | ICD-10-CM | POA: Insufficient documentation

## 2016-09-08 DIAGNOSIS — S5011XA Contusion of right forearm, initial encounter: Secondary | ICD-10-CM | POA: Diagnosis not present

## 2016-09-08 DIAGNOSIS — F17210 Nicotine dependence, cigarettes, uncomplicated: Secondary | ICD-10-CM | POA: Insufficient documentation

## 2016-09-08 DIAGNOSIS — Z0441 Encounter for examination and observation following alleged adult rape: Secondary | ICD-10-CM | POA: Diagnosis present

## 2016-09-08 DIAGNOSIS — T7421XA Adult sexual abuse, confirmed, initial encounter: Secondary | ICD-10-CM | POA: Diagnosis not present

## 2016-09-08 DIAGNOSIS — S5012XA Contusion of left forearm, initial encounter: Secondary | ICD-10-CM | POA: Diagnosis not present

## 2016-09-08 LAB — RAPID HIV SCREEN (HIV 1/2 AB+AG)
HIV 1/2 Antibodies: NONREACTIVE
HIV-1 P24 Antigen - HIV24: NONREACTIVE

## 2016-09-08 LAB — POC URINE PREG, ED: Preg Test, Ur: NEGATIVE

## 2016-09-08 MED ORDER — METRONIDAZOLE 500 MG PO TABS
2000.0000 mg | ORAL_TABLET | Freq: Once | ORAL | Status: AC
  Administered 2016-09-08: 2000 mg via ORAL
  Filled 2016-09-08: qty 4

## 2016-09-08 MED ORDER — PROMETHAZINE HCL 25 MG PO TABS
25.0000 mg | ORAL_TABLET | Freq: Four times a day (QID) | ORAL | Status: DC | PRN
  Administered 2016-09-08: 25 mg via ORAL
  Administered 2016-09-08: 50 mg via ORAL
  Filled 2016-09-08: qty 1

## 2016-09-08 MED ORDER — CEFTRIAXONE SODIUM 250 MG IJ SOLR
250.0000 mg | Freq: Once | INTRAMUSCULAR | Status: AC
  Administered 2016-09-08: 250 mg via INTRAMUSCULAR
  Filled 2016-09-08: qty 250

## 2016-09-08 MED ORDER — ULIPRISTAL ACETATE 30 MG PO TABS
30.0000 mg | ORAL_TABLET | Freq: Once | ORAL | Status: AC
  Administered 2016-09-08: 30 mg via ORAL

## 2016-09-08 MED ORDER — LIDOCAINE HCL (PF) 1 % IJ SOLN
0.9000 mL | Freq: Once | INTRAMUSCULAR | Status: AC
  Administered 2016-09-08: 0.9 mL
  Filled 2016-09-08: qty 5

## 2016-09-08 MED ORDER — AZITHROMYCIN 250 MG PO TABS
1000.0000 mg | ORAL_TABLET | Freq: Once | ORAL | Status: AC
  Administered 2016-09-08: 1000 mg via ORAL
  Filled 2016-09-08: qty 4
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
## 2016-09-22 ENCOUNTER — Ambulatory Visit: Payer: No Typology Code available for payment source

## 2016-10-08 ENCOUNTER — Emergency Department (HOSPITAL_COMMUNITY)
Admission: EM | Admit: 2016-10-08 | Discharge: 2016-10-08 | Disposition: A | Discharge status: Home / Self Care | Payer: 59 | Attending: Emergency Medicine | Admitting: Emergency Medicine

## 2016-10-08 ENCOUNTER — Encounter (HOSPITAL_COMMUNITY): Payer: Self-pay

## 2016-10-08 DIAGNOSIS — I10 Essential (primary) hypertension: Secondary | ICD-10-CM | POA: Insufficient documentation

## 2016-10-08 DIAGNOSIS — F17210 Nicotine dependence, cigarettes, uncomplicated: Secondary | ICD-10-CM | POA: Diagnosis not present

## 2016-10-08 DIAGNOSIS — Z79899 Other long term (current) drug therapy: Secondary | ICD-10-CM | POA: Insufficient documentation

## 2016-10-08 DIAGNOSIS — R21 Rash and other nonspecific skin eruption: Secondary | ICD-10-CM | POA: Diagnosis not present

## 2016-10-08 LAB — COMPREHENSIVE METABOLIC PANEL
ALT: 14 U/L (ref 14–54)
AST: 18 U/L (ref 15–41)
Albumin: 4 g/dL (ref 3.5–5.0)
Alkaline Phosphatase: 51 U/L (ref 38–126)
Anion gap: 7 (ref 5–15)
BILIRUBIN TOTAL: 0.3 mg/dL (ref 0.3–1.2)
BUN: 8 mg/dL (ref 6–20)
CO2: 23 mmol/L (ref 22–32)
CREATININE: 0.78 mg/dL (ref 0.44–1.00)
Calcium: 9.3 mg/dL (ref 8.9–10.3)
Chloride: 108 mmol/L (ref 101–111)
Glucose, Bld: 88 mg/dL (ref 65–99)
Potassium: 4 mmol/L (ref 3.5–5.1)
Sodium: 138 mmol/L (ref 135–145)
TOTAL PROTEIN: 7.3 g/dL (ref 6.5–8.1)

## 2016-10-08 LAB — I-STAT BETA HCG BLOOD, ED (MC, WL, AP ONLY)

## 2016-10-08 LAB — CBC WITH DIFFERENTIAL/PLATELET
Basophils Absolute: 0.1 10*3/uL (ref 0.0–0.1)
Basophils Relative: 1 %
EOS ABS: 0.1 10*3/uL (ref 0.0–0.7)
Eosinophils Relative: 3 %
HEMATOCRIT: 36.3 % (ref 36.0–46.0)
HEMOGLOBIN: 11.6 g/dL — AB (ref 12.0–15.0)
LYMPHS ABS: 1.5 10*3/uL (ref 0.7–4.0)
Lymphocytes Relative: 30 %
MCH: 27.3 pg (ref 26.0–34.0)
MCHC: 32 g/dL (ref 30.0–36.0)
MCV: 85.4 fL (ref 78.0–100.0)
MONO ABS: 0.4 10*3/uL (ref 0.1–1.0)
MONOS PCT: 7 %
NEUTROS PCT: 59 %
Neutro Abs: 3 10*3/uL (ref 1.7–7.7)
Platelets: 203 10*3/uL (ref 150–400)
RBC: 4.25 MIL/uL (ref 3.87–5.11)
RDW: 14.2 % (ref 11.5–15.5)
WBC: 5.1 10*3/uL (ref 4.0–10.5)

## 2016-10-08 LAB — C-REACTIVE PROTEIN

## 2016-10-08 LAB — SEDIMENTATION RATE: SED RATE: 31 mm/h — AB (ref 0–22)

## 2016-10-08 MED ORDER — TRIAMCINOLONE ACETONIDE 0.025 % EX OINT: 1.0000 "application " | TOPICAL_OINTMENT | Freq: Two times a day (BID) | CUTANEOUS | 0 refills | Status: DC

## 2016-10-08 NOTE — ED Provider Notes (Signed)
[facility]EMER[facility]Y DEPT Provider Note   CSN: [id number] Arrival date & time: 10/08/16  [id number]     History   Chief Complaint No chief complaint on file.   HPI Theresa Horne is a 25 y.o. female who presents emergency Department with chief complaint of painful rash to her legs. Patient states that this began about 2 weeks ago at the distal portion of the legs and has spread upward. She has been using cortisone cream without relief. She has some itching, but predominantly pain. No new lotions, soaps, detergents or other skin irritation. She has no contacts with similar symptoms. She denies fevers or chills. She has noted drainage from the rash. 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  HPI  Past Medical History:  Diagnosis Date  . Hypertension   . Preterm labor     Patient Active Problem List   Diagnosis Date Noted  . Anemia 10/04/2015  . Abdominal pressure 06/09/2015  . History of preterm delivery, currently pregnant -- delivered at 25 wks in March 2012 06/09/2015  . Hydradenitis 04/16/2012    Past Surgical History:  Procedure Laterality Date  . INDUCED ABORTION      OB History    Gravida Para Term Preterm AB Living   3 2 1 1 1 2    SAB TAB Ectopic Multiple Live Births     1   0 2       Home Medications    Prior to Admission medications   Medication Sig Start Date End Date Taking? Authorizing Provider  Prenatal Vit-Fe Fumarate-FA (PRENATAL MULTIVITAMIN) TABS tablet Take 1 tablet by mouth daily at 12 noon. Reported on 03/08/2016    Historical Provider, MD    Family History Family History  Problem Relation Age of Onset  . Hypertension Father   . Diabetes Paternal Uncle   . Renal Disease Maternal Grandmother     Social History Social History  Substance Use Topics  . Smoking status: Current Every Day Smoker    Packs/day: 0.00    Types: Cigarettes    Last attempt to quit: 01/26/2015  . Smokeless tobacco:  Never Used  . Alcohol use Yes     Comment: social use before preg     Allergies   Patient has no known allergies.   Review of Systems Review of Systems  Ten systems reviewed and are negative for acute change, except as noted in the HPI.   Physical Exam Updated Vital Signs BP 123/80 (BP Location: Left Arm)   Pulse 81   Temp 98.6 F (37 C) (Oral)   Resp 18   LMP 08/25/2016 (Approximate)   SpO2 100%   Physical Exam  Constitutional: She is oriented to person, place, and time. She appears well-developed and well-nourished. No distress.  HENT:  Head: Normocephalic and atraumatic.  Eyes: Conjunctivae are normal. No scleral icterus.  Neck: Normal range of motion.  Cardiovascular: Normal rate, regular rhythm and normal heart sounds.  Exam reveals no gallop and no friction rub.   No murmur heard. Pulmonary/Chest: Effort normal and breath sounds normal. No respiratory distress.  Abdominal: Soft. Bowel sounds are normal. She exhibits no distension and no mass. There is no tenderness. There is no guarding.  Neurological: She is alert and oriented to person, place, and time.  Skin: Skin is warm and dry. Rash noted. She is not diaphoretic.  Multiple singular erythematous, non-blanchable maculopapular lesions on the bilateral lower extremities, not involving the feet.  They are tender to palpation. Some have a petechial component. Others have a small central papule. No signs of cellulitis.  Nursing note and vitals reviewed.    ED Treatments / Results  Labs (all labs ordered are listed, but only abnormal results are displayed) Labs Reviewed  CBC WITH DIFFERENTIAL/PLATELET - Abnormal; Notable for the following:       Result Value   Hemoglobin 11.6 (*)    All other components within normal limits  COMPREHENSIVE METABOLIC PANEL  SEDIMENTATION RATE  C-REACTIVE PROTEIN  I-STAT BETA HCG BLOOD, ED ([facility], [facility], [facility] ONLY)    EKG  EKG Interpretation None       Radiology No results  found.  Procedures Procedures (including critical care time)  Medications Ordered in ED Medications - No data to display   Initial Impression / Assessment and Plan / ED Course  I have reviewed the triage vital signs and the nursing notes.  Pertinent labs & imaging results that were available during my care of the patient were reviewed by me and considered in my medical decision making (see chart for details).  Clinical Course     Patient with nonspecific eruption. No signs of infection. Discharge with symptomatic treatment. Follow up with Dermatolgy in 2-3 days. NO thrombocytopenia on cbc. Labs otherwise WNL.  Inflammatory markers pending. She will need to have pcp follow up,.        Final Clinical Impressions(s) / ED Diagnoses   Final diagnoses:  None    New Prescriptions New Prescriptions   No medications on file     Elmond Poehlman HarriArthor Captains, PA-C 10/08/16 1202    Benjiman CoreNathan Pickering, MD 10/08/16 1538

## 2016-10-08 NOTE — Discharge Instructions (Signed)
Your labs were normal today. Two other labs are pending.  You will need to establish care with a pcp. Follow up with a dermatologist.  SEEK IMMEDIATE MEDICAL CARE IF:  You have increasing pain, swelling, or redness.  You have a fever.  You have new or severe symptoms.  You have body aches, diarrhea, or vomiting.  Your rash is not better after 3 days.

## 2016-10-08 NOTE — ED Triage Notes (Signed)
Patient here with red, itchy fine rash to legs and abdomen x 2 weeks. Burns with scratching and contact with clothing.

## 2016-10-28 DIAGNOSIS — L309 Dermatitis, unspecified: Secondary | ICD-10-CM | POA: Diagnosis not present

## 2016-11-25 DIAGNOSIS — L959 Vasculitis limited to the skin, unspecified: Secondary | ICD-10-CM | POA: Diagnosis not present

## 2016-11-25 DIAGNOSIS — L309 Dermatitis, unspecified: Secondary | ICD-10-CM | POA: Diagnosis not present

## 2017-01-10 ENCOUNTER — Encounter (HOSPITAL_COMMUNITY): Payer: Self-pay | Admitting: Emergency Medicine

## 2017-01-10 ENCOUNTER — Emergency Department (HOSPITAL_COMMUNITY)
Admission: EM | Admit: 2017-01-10 | Discharge: 2017-01-10 | Disposition: A | Discharge status: Home / Self Care | Payer: Self-pay | Attending: Emergency Medicine | Admitting: Emergency Medicine

## 2017-01-10 ENCOUNTER — Emergency Department (HOSPITAL_COMMUNITY): Payer: Self-pay

## 2017-01-10 DIAGNOSIS — R7989 Other specified abnormal findings of blood chemistry: Secondary | ICD-10-CM | POA: Insufficient documentation

## 2017-01-10 DIAGNOSIS — R079 Chest pain, unspecified: Secondary | ICD-10-CM | POA: Diagnosis not present

## 2017-01-10 DIAGNOSIS — E349 Endocrine disorder, unspecified: Secondary | ICD-10-CM

## 2017-01-10 DIAGNOSIS — I10 Essential (primary) hypertension: Secondary | ICD-10-CM | POA: Insufficient documentation

## 2017-01-10 DIAGNOSIS — F17210 Nicotine dependence, cigarettes, uncomplicated: Secondary | ICD-10-CM | POA: Insufficient documentation

## 2017-01-10 LAB — CBC
HCT: 34.6 % — ABNORMAL LOW (ref 36.0–46.0)
Hemoglobin: 10.9 g/dL — ABNORMAL LOW (ref 12.0–15.0)
MCH: 26.8 pg (ref 26.0–34.0)
MCHC: 31.5 g/dL (ref 30.0–36.0)
MCV: 85 fL (ref 78.0–100.0)
Platelets: 217 10*3/uL (ref 150–400)
RBC: 4.07 MIL/uL (ref 3.87–5.11)
RDW: 14.1 % (ref 11.5–15.5)
WBC: 7.7 10*3/uL (ref 4.0–10.5)

## 2017-01-10 LAB — BASIC METABOLIC PANEL
Anion gap: 9 (ref 5–15)
BUN: 9 mg/dL (ref 6–20)
CHLORIDE: 106 mmol/L (ref 101–111)
CO2: 24 mmol/L (ref 22–32)
CREATININE: 0.86 mg/dL (ref 0.44–1.00)
Calcium: 9.5 mg/dL (ref 8.9–10.3)
GFR calc Af Amer: >60 mL/min (ref >60)
GFR calc non Af Amer: >60 mL/min (ref >60)
GLUCOSE: 87 mg/dL (ref 65–99)
Potassium: 3.6 mmol/L (ref 3.5–5.1)
Sodium: 139 mmol/L (ref 135–145)

## 2017-01-10 LAB — I-STAT TROPONIN, ED: Troponin i, poc: 0 ng/mL (ref 0.00–0.08)

## 2017-01-10 LAB — I-STAT BETA HCG BLOOD, ED (MC, WL, AP ONLY): I-stat hCG, quantitative: 46.8 m[IU]/mL — ABNORMAL HIGH (ref <5)

## 2017-01-10 NOTE — ED Triage Notes (Signed)
Pt here c/o left sided CP x 3 days

## 2017-01-10 NOTE — Discharge Instructions (Signed)
Urine pregnancy test was slightly elevated today. As we discussed I recommend a repeat pregnancy test within  a couple of weeks.  Follow-up with your primary care doctor

## 2017-01-10 NOTE — ED Notes (Signed)
Pt left before d/c papers could be given to the pt.

## 2017-01-10 NOTE — ED Provider Notes (Signed)
[facility] Provider Note   CSN: [id number] Arrival date & time: 01/10/17  1726     History   Chief Complaint Chief Complaint  Patient presents with  . Chest Pain    HPI Theresa Horne is a 26 y.o. female.  Pt works in the hospital.  She mentioned to a nurse she was having chest pain.  She was told to    The history is provided by the patient.  Chest Pain   This is a new problem. The current episode started more than 2 days ago. The problem occurs constantly. The problem has been gradually worsening. The pain is present in the lateral region (left). The pain is moderate. The quality of the pain is described as sharp. The pain does not radiate. Exacerbated by: being still, better when moving around. Pertinent negatives include no back pain, no fever and no sputum production. She has tried nothing for the symptoms. The treatment provided no relief.    Past Medical History:  Diagnosis Date  . Hypertension   . Preterm labor     Patient Active Problem List   Diagnosis Date Noted  . Anemia 10/04/2015  . Abdominal pressure 06/09/2015  . History of preterm delivery, currently pregnant -- delivered at 25 wks in March 2012 06/09/2015  . Hydradenitis 04/16/2012    Past Surgical History:  Procedure Laterality Date  . INDUCED ABORTION      OB History    Gravida Para Term Preterm AB Living   3 2 1 1 1 2    SAB TAB Ectopic Multiple Live Births     1   0 2       Home Medications    Prior to Admission medications   Medication Sig Start Date End Date Taking? Authorizing Provider  triamcinolone (KENALOG) 0.025 % ointment Apply 1 application topically 2 (two) times daily. Patient not taking: Reported on 01/10/2017 10/08/16   Arthor Captain, PA-C    Family History Family History  Problem Relation Age of Onset  . Hypertension Father   . Diabetes Paternal Uncle   . Renal Disease Maternal Grandmother     Social History Social History  Substance Use Topics  .  Smoking status: Current Every Day Smoker    Packs/day: 0.00    Types: Cigarettes    Last attempt to quit: 01/26/2015  . Smokeless tobacco: Never Used  . Alcohol use Yes     Comment: social use before preg     Allergies   Patient has no known allergies.   Review of Systems Review of Systems  Constitutional: Negative for fever.  Respiratory: Negative for sputum production.   Cardiovascular: Positive for chest pain.  Musculoskeletal: Negative for back pain.  All other systems reviewed and are negative.    Physical Exam Updated Vital Signs BP 124/62   Pulse (!) 37   Temp 98.8 F (37.1 C) (Oral)   Resp 16   LMP 12/21/2016   SpO2 100%   Physical Exam  Constitutional: She appears well-developed and well-nourished. No distress.  HENT:  Head: Normocephalic and atraumatic.  Right Ear: External ear normal.  Left Ear: External ear normal.  Eyes: Conjunctivae are normal. Right eye exhibits no discharge. Left eye exhibits no discharge. No scleral icterus.  Neck: Neck supple. No tracheal deviation present.  Cardiovascular: Normal rate, regular rhythm and intact distal pulses.   Pulmonary/Chest: Effort normal and breath sounds normal. No stridor. No respiratory distress. She has no wheezes. She has no rales.  Abdominal:  Soft. Bowel sounds are normal. She exhibits no distension. There is no tenderness. There is no rebound and no guarding.  Musculoskeletal: She exhibits no edema or tenderness.  Neurological: She is alert. She has normal strength. No cranial nerve deficit (no facial droop, extraocular movements intact, no slurred speech) or sensory deficit. She exhibits normal muscle tone. She displays no seizure activity. Coordination normal.  Skin: Skin is warm and dry. No rash noted.  Psychiatric: She has a normal mood and affect.  Nursing note and vitals reviewed.    ED Treatments / Results  Labs (all labs ordered are listed, but only abnormal results are displayed) Labs  Reviewed  CBC - Abnormal; Notable for the following:       Result Value   Hemoglobin 10.9 (*)    HCT 34.6 (*)    All other components within normal limits  I-STAT BETA HCG BLOOD, ED ([facility], [facility], [facility] ONLY) - Abnormal; Notable for the following:    I-stat hCG, quantitative 46.8 (*)    All other components within normal limits  BASIC METABOLIC PANEL  I-STAT TROPOININ, ED    EKG  EKG Interpretation  Date/Time:  Tuesday January 10 2017 17:32:34 EST Ventricular Rate:  88 PR Interval:  136 QRS Duration: 84 QT Interval:  384 QTC Calculation: 464 R Axis:   87 Text Interpretation:  Sinus rhythm with frequent Premature ventricular complexes and Premature atrial complexes Otherwise normal ECG No significant change since last tracing Confirmed by Hiroki Wint  MD-J, Isis Costanza ([id number]) on 01/10/2017 6:14:58 PM       Radiology Dg Chest 2 View  Result Date: 01/10/2017 CLINICAL DATA:  Left-sided chest pain for 3 days EXAM: CHEST  2 VIEW COMPARISON:  Chest radiograph October 25, 2015 and chest CT October 25, 2015 FINDINGS: Lungs are clear. Heart size and pulmonary vascularity are normal. No adenopathy. No pneumothorax. No bone lesions. IMPRESSION: No edema or consolidation. Electronically Signed   By: Bretta BangWilliam  Woodruff III M.D.   On: 01/10/2017 17:56    Procedures Procedures (including critical care time)  Medications Ordered in ED Medications - No data to display   Initial Impression / Assessment and Plan / ED Course  I have reviewed the triage vital signs and the nursing notes.  Pertinent labs & imaging results that were available during my care of the patient were reviewed by me and considered in my medical decision making (see chart for details).   patient presented to the emergency room with chest pain. Symptoms are atypical for cardiac disease. Laboratory tests are reassuring. No risk factors for PE. She is not tachycardic or tachypnea.  I doubt pulmonary malaise him.  Incidentally the patient's  hCG was 46.8. I explained to her this is elevated. Patient's last menstrual period was normal 3 weeks ago. It is possible that she is in the very early stages of pregnancy however this result is just barely above normal. We discussed having a repeat test in the next couple weeks with her primary care doctor. Avoid any medications, alcohol etc.  Final Clinical Impressions(s) / ED Diagnoses   Final diagnoses:  Chest pain, unspecified type  Elevated serum hCG      Linwood DibblesJon Donaciano Range, MD 01/10/17 2016

## 2017-01-10 NOTE — ED Notes (Signed)
ED Provider at bedside. 

## 2017-10-17 ENCOUNTER — Encounter (HOSPITAL_COMMUNITY): Payer: Self-pay | Admitting: *Deleted

## 2017-10-17 ENCOUNTER — Emergency Department (HOSPITAL_COMMUNITY)
Admission: EM | Admit: 2017-10-17 | Discharge: 2017-10-17 | Disposition: A | Discharge status: Home / Self Care | Payer: No Typology Code available for payment source | Attending: Emergency Medicine | Admitting: Emergency Medicine

## 2017-10-17 DIAGNOSIS — S199XXA Unspecified injury of neck, initial encounter: Secondary | ICD-10-CM | POA: Diagnosis present

## 2017-10-17 DIAGNOSIS — Y929 Unspecified place or not applicable: Secondary | ICD-10-CM | POA: Diagnosis not present

## 2017-10-17 DIAGNOSIS — Y9389 Activity, other specified: Secondary | ICD-10-CM | POA: Insufficient documentation

## 2017-10-17 DIAGNOSIS — Y999 Unspecified external cause status: Secondary | ICD-10-CM | POA: Insufficient documentation

## 2017-10-17 DIAGNOSIS — F17210 Nicotine dependence, cigarettes, uncomplicated: Secondary | ICD-10-CM | POA: Diagnosis not present

## 2017-10-17 DIAGNOSIS — R51 Headache: Secondary | ICD-10-CM | POA: Insufficient documentation

## 2017-10-17 DIAGNOSIS — D649 Anemia, unspecified: Secondary | ICD-10-CM | POA: Insufficient documentation

## 2017-10-17 DIAGNOSIS — M542 Cervicalgia: Secondary | ICD-10-CM | POA: Diagnosis not present

## 2017-10-17 DIAGNOSIS — T148XXA Other injury of unspecified body region, initial encounter: Secondary | ICD-10-CM | POA: Diagnosis not present

## 2017-10-17 DIAGNOSIS — S161XXA Strain of muscle, fascia and tendon at neck level, initial encounter: Secondary | ICD-10-CM | POA: Insufficient documentation

## 2017-10-17 DIAGNOSIS — I10 Essential (primary) hypertension: Secondary | ICD-10-CM | POA: Diagnosis not present

## 2017-10-17 MED ORDER — IBUPROFEN 400 MG PO TABS
600.0000 mg | ORAL_TABLET | Freq: Once | ORAL | Status: AC
  Administered 2017-10-17: 600 mg via ORAL
  Filled 2017-10-17: qty 1

## 2017-10-17 MED ORDER — CYCLOBENZAPRINE HCL 5 MG PO TABS: 5.0000 mg | ORAL_TABLET | Freq: Three times a day (TID) | ORAL | 0 refills | Status: DC | PRN
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
## 2017-10-17 NOTE — Discharge Instructions (Signed)
If you were given medicines take as directed.  If you are on coumadin or contraceptives realize their levels and effectiveness is altered by many different medicines.  If you have any reaction (rash, tongues swelling, other) to the medicines stop taking and see a physician.    If your blood pressure was elevated in the ER make sure you follow up for management with a primary doctor or return for chest pain, shortness of breath or stroke symptoms.  Please follow up as directed and return to the ER or see a physician for new or worsening symptoms.  Thank you. Vitals:   10/17/17 1546  BP: 136/72  Pulse: (!) 45  Resp: 20  Temp: 98.3 F (36.8 C)  TempSrc: Oral  SpO2: 99%

## 2017-11-15 ENCOUNTER — Encounter (HOSPITAL_COMMUNITY): Payer: Self-pay | Admitting: Emergency Medicine

## 2017-11-15 ENCOUNTER — Emergency Department (HOSPITAL_COMMUNITY)
Admission: EM | Admit: 2017-11-15 | Discharge: 2017-11-15 | Disposition: A | Discharge status: Home / Self Care | Payer: 59 | Attending: Emergency Medicine | Admitting: Emergency Medicine

## 2017-11-15 DIAGNOSIS — A599 Trichomoniasis, unspecified: Secondary | ICD-10-CM | POA: Insufficient documentation

## 2017-11-15 DIAGNOSIS — N3001 Acute cystitis with hematuria: Secondary | ICD-10-CM | POA: Insufficient documentation

## 2017-11-15 DIAGNOSIS — I10 Essential (primary) hypertension: Secondary | ICD-10-CM | POA: Insufficient documentation

## 2017-11-15 DIAGNOSIS — F17210 Nicotine dependence, cigarettes, uncomplicated: Secondary | ICD-10-CM | POA: Insufficient documentation

## 2017-11-15 LAB — URINALYSIS, ROUTINE W REFLEX MICROSCOPIC
Bilirubin Urine: NEGATIVE
GLUCOSE, UA: NEGATIVE mg/dL
Ketones, ur: NEGATIVE mg/dL
NITRITE: NEGATIVE
PH: 6 (ref 5.0–8.0)
PROTEIN: 100 mg/dL — AB
SPECIFIC GRAVITY, URINE: 1.024 (ref 1.005–1.030)

## 2017-11-15 LAB — WET PREP, GENITAL
Clue Cells Wet Prep HPF POC: NONE SEEN
SPERM: NONE SEEN
Trich, Wet Prep: POSITIVE — AB
Yeast Wet Prep HPF POC: NONE SEEN

## 2017-11-15 LAB — PREGNANCY, URINE: PREG TEST UR: NEGATIVE

## 2017-11-15 MED ORDER — ACETAMINOPHEN 325 MG PO TABS
650.0000 mg | ORAL_TABLET | Freq: Once | ORAL | Status: AC
  Administered 2017-11-15: 650 mg via ORAL
  Filled 2017-11-15: qty 2

## 2017-11-15 MED ORDER — NITROFURANTOIN MONOHYD MACRO 100 MG PO CAPS: 100.0000 mg | ORAL_CAPSULE | Freq: Two times a day (BID) | ORAL | 0 refills | Status: DC

## 2017-11-15 MED ORDER — METRONIDAZOLE 500 MG PO TABS
2000.0000 mg | ORAL_TABLET | Freq: Once | ORAL | Status: AC
  Administered 2017-11-15: 2000 mg via ORAL
  Filled 2017-11-15: qty 4

## 2017-11-15 NOTE — ED Provider Notes (Signed)
[facility]-EMERGENCY DEPT Provider Note   CSN: [id number] Arrival date & time: 11/15/17  1442     History   Chief Complaint Chief Complaint  Patient presents with  . Vaginal Discharge    HPI Theresa Horne is a 26 y.o. female.  HPI 26 year old female presents to the ED for evaluation of vaginal discharge.  States the vaginal discharge started approximate 1.5 weeks ago.  She states that she thinks she has bacterial vaginosis.  Thought at first that it was a yeast infection however the discharge did not look like a yeast discharge.  Reports yellow discharge.  She denies any associated pelvic pain, vaginal bleeding, abdominal pain, nausea, emesis, fever, urinary symptoms, change in bowel habits.  Patient states she is sexually active with her husband.  They do not use protection.  She has low suspicion for an STD.  Patient tried over-the-counter Monistat to help with her discharge but says it was not successful in treating the discharge. Past Medical History:  Diagnosis Date  . Hypertension   . Preterm labor     Patient Active Problem List   Diagnosis Date Noted  . Anemia 10/04/2015  . Abdominal pressure 06/09/2015  . History of preterm delivery, currently pregnant -- delivered at 25 wks in March 2012 06/09/2015  . Hydradenitis 04/16/2012    Past Surgical History:  Procedure Laterality Date  . INDUCED ABORTION      OB History    Gravida Para Term Preterm AB Living   3 2 1 1 1 2    SAB TAB Ectopic Multiple Live Births     1   0 2       Home Medications    Prior to Admission medications   Medication Sig Start Date End Date Taking? Authorizing Provider  acetaminophen (TYLENOL) 500 MG tablet Take 1,000 mg by mouth every 6 (six) hours as needed for headache.   Yes [provider]  CRANBERRY PO Take 1 tablet by mouth daily.   Yes [provider]  ibuprofen (ADVIL,MOTRIN) 200 MG tablet Take 400 mg by mouth every 6 (six) hours as  needed for headache.   Yes [provider]  cyclobenzaprine (FLEXERIL) 5 MG tablet Take 1 tablet (5 mg total) by mouth 3 (three) times daily as needed for muscle spasms. Patient not taking: Reported on 11/15/2017 10/17/17   Blane OharaZavitz, Joshua, MD  nitrofurantoin, macrocrystal-monohydrate, (MACROBID) 100 MG capsule Take 1 capsule (100 mg total) by mouth 2 (two) times daily. 11/15/17   Rise MuLeaphart, Jacquita Mulhearn T, PA-C  triamcinolone (KENALOG) 0.025 % ointment Apply 1 application topically 2 (two) times daily. Patient not taking: Reported on 01/10/2017 10/08/16   Arthor CaptainHarris, Abigail, PA-C    Family History Family History  Problem Relation Age of Onset  . Hypertension Father   . Diabetes Paternal Uncle   . Renal Disease Maternal Grandmother     Social History Social History   Tobacco Use  . Smoking status: Current Every Day Smoker    Packs/day: 0.00    Types: Cigarettes    Last attempt to quit: 01/26/2015    Years since quitting: 2.8  . Smokeless tobacco: Never Used  Substance Use Topics  . Alcohol use: Yes    Comment: social use before preg  . Drug use: No     Allergies   Patient has no known allergies.   Review of Systems Review of Systems  Constitutional: Negative for chills and fever.  Gastrointestinal: Negative for abdominal pain, blood in stool, nausea  and vomiting.  Genitourinary: Positive for vaginal discharge. Negative for dysuria, flank pain, frequency, genital sores, hematuria, pelvic pain, urgency, vaginal bleeding and vaginal pain.  Skin: Negative for rash.     Physical Exam Updated Vital Signs BP (!) 167/91 (BP Location: Right Arm)   Pulse (!) 50   Temp 98.2 F (36.8 C) (Oral)   Resp 20   SpO2 100%   Physical Exam  Constitutional: She is oriented to person, place, and time. She appears well-developed and well-nourished.  Non-toxic appearance. No distress.  HENT:  Head: Normocephalic and atraumatic.  Eyes: Conjunctivae are normal. Right eye exhibits no  discharge. Left eye exhibits no discharge.  Neck: Normal range of motion.  Pulmonary/Chest: Effort normal.  Abdominal: Soft. Bowel sounds are normal. There is no tenderness. There is no rebound and no guarding.  Genitourinary:  Genitourinary Comments: Chaperone present for exam. No external lesions, swelling, erythema, or rash of the labia. No erythema, bleeding, or lesions noted in the vaginal vault.  Yellow discharge noted in the vaginal vault.  No CMT tenderness, bleeding or friability. No adnexal tenderness, mass or fullness bilaterally. No inguinal adenopathy or hernia.    Musculoskeletal: Normal range of motion. She exhibits no tenderness.  Neurological: She is alert and oriented to person, place, and time.  Skin: Skin is warm and dry. Capillary refill takes less than 2 seconds.  Psychiatric: Her behavior is normal. Judgment and thought content normal.  Nursing note and vitals reviewed.    ED Treatments / Results  Labs (all labs ordered are listed, but only abnormal results are displayed) Labs Reviewed  WET PREP, GENITAL - Abnormal; Notable for the following components:      Result Value   Trich, Wet Prep POSITIVE (*)    WBC, Wet Prep HPF POC MANY (*)    All other components within normal limits  URINALYSIS, ROUTINE W REFLEX MICROSCOPIC - Abnormal; Notable for the following components:   APPearance CLOUDY (*)    Hgb urine dipstick MODERATE (*)    Protein, ur 100 (*)    Leukocytes, UA LARGE (*)    Bacteria, UA FEW (*)    Squamous Epithelial / LPF 6-30 (*)    All other components within normal limits  PREGNANCY, URINE  POC URINE PREG, ED  GC/CHLAMYDIA PROBE AMP () NOT AT [facility]    EKG  EKG Interpretation None       Radiology No results found.  Procedures Procedures (including critical care time)  Medications Ordered in ED Medications  metroNIDAZOLE (FLAGYL) tablet 2,000 mg (not administered)  acetaminophen (TYLENOL) tablet 650 mg (650 mg Oral Given  11/15/17 2156)     Initial Impression / Assessment and Plan / ED Course  I have reviewed the triage vital signs and the nursing notes.  Pertinent labs & imaging results that were available during my care of the patient were reviewed by me and considered in my medical decision making (see chart for details).     Patient presents to the ED for evaluation of vaginal discharge.  A week and a half.  Denies any associated pelvic pain, vaginal bleeding, urinary symptoms, abdominal pain, nausea, emesis, fevers.  Vital signs are reassuring.  Patient is afebrile.  On exam she has no focal abdominal tenderness.  No signs of peritonitis.  Pelvic exam reveals some yellow discharge but no cervical motion tenderness or adnexal tenderness.  No external lesions were noted.  Urine pregnancy test is negative.  Wet prep shows trichomonas but no  other abnormalities.  UA shows signs of infection.  Patient denies any urinary symptoms however will treat with Macrobid.  Low suspicion for pyelonephritis.  The patient treatment for trichomonas with Flagyl.  Also told patient to inform all sexual partners that they need to be treated for trichomonas.  Patient also given Macrobid.  Clinical presentation and physical exam does not seem consistent with PID, pyelonephritis, ectopic pregnancy, appendicitis or other intra-abdominal pathology.  Pt is hemodynamically stable, in NAD, & able to ambulate in the ED. Evaluation does not show pathology that would require ongoing emergent intervention or inpatient treatment. I explained the diagnosis to the patient. Pain has been managed & has no complaints prior to dc. Pt is comfortable with above plan and is stable for discharge at this time. All questions were answered prior to disposition. Strict return precautions for f/u to the ED were discussed. Encouraged follow up with PCP.   Final Clinical Impressions(s) / ED Diagnoses   Final diagnoses:  Trichimoniasis  Acute cystitis  with hematuria    ED Discharge Orders        Ordered    nitrofurantoin, macrocrystal-monohydrate, (MACROBID) 100 MG capsule  2 times daily     11/15/17 2224       Wallace KellerLeaphart, Ailey Wessling T, PA-C 11/15/17 2232    Lorre NickAllen, Anthony, MD 11/15/17 2243

## 2017-11-15 NOTE — ED Triage Notes (Signed)
Pt reports yellow odorless vaginal discharge for the past 1.5 weeks. Pt reports it feels like when she had BV.

## 2017-11-15 NOTE — Discharge Instructions (Signed)
You do have a trichomonas on wet prep.  No signs of bacterial vaginosis.  He has been treated in the ED.  This is a sexually transmitted disease.  All sexual partners need to be treated even if they are asymptomatic.  Have also given you an antibiotic called Macrobid to treat for urinary tract infection.  Follow-up with a OB/GYN doctor primary care doctor.  Return to the ED with any worsening symptoms.

## 2017-11-15 NOTE — ED Notes (Signed)
Pelvic setup at bedside.

## 2017-11-16 LAB — GC/CHLAMYDIA PROBE AMP (~~LOC~~) NOT AT ARMC
CHLAMYDIA, DNA PROBE: NEGATIVE
Neisseria Gonorrhea: NEGATIVE

## 2018-03-22 ENCOUNTER — Emergency Department (HOSPITAL_COMMUNITY)
Admission: EM | Admit: 2018-03-22 | Discharge: 2018-03-22 | Disposition: A | Discharge status: Home / Self Care | Payer: No Typology Code available for payment source | Attending: Emergency Medicine | Admitting: Emergency Medicine

## 2018-03-22 ENCOUNTER — Encounter (HOSPITAL_COMMUNITY): Payer: Self-pay | Admitting: Emergency Medicine

## 2018-03-22 ENCOUNTER — Emergency Department (HOSPITAL_COMMUNITY): Payer: No Typology Code available for payment source

## 2018-03-22 DIAGNOSIS — Z87891 Personal history of nicotine dependence: Secondary | ICD-10-CM | POA: Insufficient documentation

## 2018-03-22 DIAGNOSIS — Z79899 Other long term (current) drug therapy: Secondary | ICD-10-CM | POA: Insufficient documentation

## 2018-03-22 DIAGNOSIS — I10 Essential (primary) hypertension: Secondary | ICD-10-CM | POA: Insufficient documentation

## 2018-03-22 DIAGNOSIS — R0789 Other chest pain: Secondary | ICD-10-CM | POA: Insufficient documentation

## 2018-03-22 LAB — I-STAT TROPONIN, ED: Troponin i, poc: 0 ng/mL (ref 0.00–0.08)

## 2018-03-22 LAB — BASIC METABOLIC PANEL
Anion gap: 8 (ref 5–15)
BUN: 9 mg/dL (ref 6–20)
CO2: 25 mmol/L (ref 22–32)
CREATININE: 0.87 mg/dL (ref 0.44–1.00)
Calcium: 9.4 mg/dL (ref 8.9–10.3)
Chloride: 105 mmol/L (ref 101–111)
GFR calc Af Amer: >60 mL/min (ref >60)
Glucose, Bld: 102 mg/dL — ABNORMAL HIGH (ref 65–99)
Potassium: 4 mmol/L (ref 3.5–5.1)
Sodium: 138 mmol/L (ref 135–145)

## 2018-03-22 LAB — I-STAT BETA HCG BLOOD, ED (MC, WL, AP ONLY)

## 2018-03-22 LAB — CBC
HCT: 35 % — ABNORMAL LOW (ref 36.0–46.0)
Hemoglobin: 11 g/dL — ABNORMAL LOW (ref 12.0–15.0)
MCH: 26 pg (ref 26.0–34.0)
MCHC: 31.4 g/dL (ref 30.0–36.0)
MCV: 82.7 fL (ref 78.0–100.0)
PLATELETS: 214 10*3/uL (ref 150–400)
RBC: 4.23 MIL/uL (ref 3.87–5.11)
RDW: 15.1 % (ref 11.5–15.5)
WBC: 6.1 10*3/uL (ref 4.0–10.5)

## 2018-03-22 MED ORDER — KETOROLAC TROMETHAMINE 30 MG/ML IJ SOLN
30.0000 mg | Freq: Once | INTRAMUSCULAR | Status: AC
  Administered 2018-03-22: 30 mg via INTRAMUSCULAR
  Filled 2018-03-22: qty 1

## 2018-03-22 MED ORDER — NAPROXEN 375 MG PO TABS: 375.0000 mg | ORAL_TABLET | Freq: Two times a day (BID) | ORAL | 0 refills | Status: DC

## 2018-03-22 NOTE — ED Provider Notes (Signed)
[facility] EMERGENCY DEPARTMENT Provider Note   CSN: [id number] Arrival date & time: 03/22/18  4098     History   Chief Complaint Chief Complaint  Patient presents with  . Chest Pain    HPI Theresa Horne is a 27 y.o. female with a history of hypertension who presents the emergency department today for chest pain.  Patient states that she was sitting down watching TV yesterday when she had a sharp left upper chest pain without radiation to her neck, jaw, either shoulder, back or down to her abdomen.  She notes that the pain was worse when lying down to triage but states the pain is not changed with position to myself.  She has the pain is been constant since onset.  It is not associated with nausea, vomiting, shortness of breath or diaphoresis.  Pain is nonpleuritic and non-exertional in nature.  She has not taken anything for her symptoms.  She denies similar pain in the past.  Denies any preceding URI symptoms or infectious symptoms.  Patient denies family history of early CAD.  Denies personal history of MI, CVA or PAD.  She reports prior tobacco abuse but quit 4 months ago.  Patient denies previous echocardiogram, heart catheterization or stress test. Denies risk factors for DVT/PE including exogenous estrogen use, recent surgery or travel, trauma, immobilization, previous blood clot, cough, hemoptysis, cancer, lower extremity pain or swelling, or history of clotting disorder.   HPI  Past Medical History:  Diagnosis Date  . Hypertension   . Preterm labor     Patient Active Problem List   Diagnosis Date Noted  . Anemia 10/04/2015  . Abdominal pressure 06/09/2015  . History of preterm delivery, currently pregnant -- delivered at 25 wks in March 2012 06/09/2015  . Hydradenitis 04/16/2012    Past Surgical History:  Procedure Laterality Date  . INDUCED ABORTION       OB History    Gravida  3   Para  2   Term  1   Preterm  1   AB  1   Living  2       SAB      TAB  1   Ectopic      Multiple  0   Live Births  2            Home Medications    Prior to Admission medications   Medication Sig Start Date End Date Taking? Authorizing Provider  acetaminophen (TYLENOL) 500 MG tablet Take 1,000 mg by mouth every 6 (six) hours as needed for headache.    [provider]  CRANBERRY PO Take 1 tablet by mouth daily.    [provider]  cyclobenzaprine (FLEXERIL) 5 MG tablet Take 1 tablet (5 mg total) by mouth 3 (three) times daily as needed for muscle spasms. Patient not taking: Reported on 11/15/2017 10/17/17   Blane Ohara, MD  ibuprofen (ADVIL,MOTRIN) 200 MG tablet Take 400 mg by mouth every 6 (six) hours as needed for headache.    [provider]  nitrofurantoin, macrocrystal-monohydrate, (MACROBID) 100 MG capsule Take 1 capsule (100 mg total) by mouth 2 (two) times daily. 11/15/17   Rise Mu, PA-C  triamcinolone (KENALOG) 0.025 % ointment Apply 1 application topically 2 (two) times daily. Patient not taking: Reported on 01/10/2017 10/08/16   Arthor Captain, PA-C    Family History Family History  Problem Relation Age of Onset  . Hypertension Father   . Diabetes Paternal Uncle   .  Renal Disease Maternal Grandmother     Social History Social History   Tobacco Use  . Smoking status: Former Smoker    Packs/day: 0.00    Types: Cigarettes    Last attempt to quit: 01/26/2015    Years since quitting: 3.1  . Smokeless tobacco: Never Used  Substance Use Topics  . Alcohol use: Yes    Comment: social use before preg  . Drug use: No     Allergies   Patient has no known allergies.   Review of Systems Review of Systems  All other systems reviewed and are negative.    Physical Exam Updated Vital Signs BP (!) 140/92 (BP Location: Right Arm)   Pulse 79   Temp 99.1 F (37.3 C) (Oral)   Resp 20   Ht 5\' 4"  (1.626 m)   Wt 108.9 kg (240 lb)   SpO2 100%   BMI 41.20 kg/m    Physical Exam  Constitutional: She appears well-developed and well-nourished.  HENT:  Head: Normocephalic and atraumatic.  Right Ear: External ear normal.  Left Ear: External ear normal.  Nose: Nose normal.  Mouth/Throat: Uvula is midline, oropharynx is clear and moist and mucous membranes are normal. No tonsillar exudate.  Eyes: Pupils are equal, round, and reactive to light. Right eye exhibits no discharge. Left eye exhibits no discharge. No scleral icterus.  Neck: Trachea normal. Neck supple. No JVD present. No spinous process tenderness present. Carotid bruit is not present. No neck rigidity. Normal range of motion present.  Cardiovascular: Normal rate, regular rhythm and intact distal pulses.  No murmur heard. Pulses:      Radial pulses are 2+ on the right side, and 2+ on the left side.       Dorsalis pedis pulses are 2+ on the right side, and 2+ on the left side.       Posterior tibial pulses are 2+ on the right side, and 2+ on the left side.  No lower extremity swelling or edema. Calves symmetric in size bilaterally.  Pulmonary/Chest: Effort normal and breath sounds normal. She exhibits tenderness.  Abdominal: Soft. Bowel sounds are normal. There is no tenderness. There is no rebound and no guarding.  Musculoskeletal: She exhibits no edema.  Lymphadenopathy:    She has no cervical adenopathy.  Neurological: She is alert.  Speech clear. Follows commands. No facial droop. PERRLA. EOM grossly intact. CN III-XII grossly intact. Grossly moves all extremities 4 without ataxia. Able and appropriate strength for age to upper and lower extremities bilaterally  Skin: Skin is warm and dry. No rash noted. She is not diaphoretic.  Psychiatric: She has a normal mood and affect.  Nursing note and vitals reviewed.    ED Treatments / Results  Labs (all labs ordered are listed, but only abnormal results are displayed) Labs Reviewed  BASIC METABOLIC PANEL - Abnormal; Notable for the  following components:      Result Value   Glucose, Bld 102 (*)    All other components within normal limits  CBC - Abnormal; Notable for the following components:   Hemoglobin 11.0 (*)    HCT 35.0 (*)    All other components within normal limits  I-STAT TROPONIN, ED  I-STAT BETA HCG BLOOD, ED ([facility], [facility], [facility] ONLY)    EKG EKG Interpretation  Date/Time:  Thursday March 22 2018 07:00:39 EDT Ventricular Rate:  75 PR Interval:  154 QRS Duration: 82 QT Interval:  396 QTC Calculation: 442 R Axis:   86 Text  Interpretation:  Normal sinus rhythm Normal ECG Confirmed by Meridee Score [phone]) on 03/22/2018 9:03:11 AM   Radiology Dg Chest 2 View  Result Date: 03/22/2018 CLINICAL DATA:  Left-sided chest pain. EXAM: CHEST - 2 VIEW COMPARISON:  01/10/2017. FINDINGS: Mediastinum and hilar structures normal. Heart size normal. No focal infiltrate. No pleural effusion or pneumothorax. No acute bony abnormality. IMPRESSION: No acute abnormality.  Chest is stable from prior exam Electronically Signed   By: Maisie Fus  Register   On: 03/22/2018 07:50    Procedures Procedures (including critical care time)  Medications Ordered in ED Medications  ketorolac (TORADOL) 30 MG/ML injection 30 mg (has no administration in time range)     Initial Impression / Assessment and Plan / ED Course  I have reviewed the triage vital signs and the nursing notes.  Pertinent labs & imaging results that were available during my care of the patient were reviewed by me and considered in my medical decision making (see chart for details).     Patient presented with chest pain to the ED. Patient is to be discharged with recommendation to follow up with PCP in regards to today's hospital visit. Chest pain is not likely of cardiac or pulmonary etiology due to presentation, wells and perc negative, stable vital signs, no tracheal deviation, no JVD or new murmur, heart RRR, breath sounds equal bilaterally, EKG without acute  abnormalities, negative troponin, and negative CXR. HEART score is low risk. Patient has been advised to return to the ED if chest pain becomes exertional, associated with diaphoresis or nausea, radiates to left jaw/arm, worsens or becomes concerning in any way. Patient appears reliable for follow up and is agreeable to discharge. I advised the patient to follow-up with their primary care provider this week. I advised the patient to return to the emergency department with new or worsening symptoms or new concerns. The patient verbalized understanding and agreement with plan. Patient stable for discharge.   Final Clinical Impressions(s) / ED Diagnoses   Final diagnoses:  Atypical chest pain    ED Discharge Orders        Ordered    naproxen (NAPROSYN) 375 MG tablet  2 times daily     03/22/18 0915       Princella Pellegrini 03/22/18 6045    Terrilee Files, MD 03/24/18 [phone]

## 2018-03-22 NOTE — ED Triage Notes (Signed)
Pt reports L sided CP onset last night, pt states pain is constant, sharp in nature, 5/10. States pain is worse when laying down. Denies SOB, N/V, dizziness, lightheadedness.

## 2018-03-22 NOTE — Discharge Instructions (Addendum)
Read instructions below for reasons to return to the Emergency Department. It is recommended that your follow up with your Primary Care Doctor in regards to today's visit. If you do not have a doctor, use the resource guide listed below to help you find one. Begin taking Naproxen with food as prescribed.    Tests performed today include: An EKG of your heart A chest x-ray Cardiac enzymes - a blood test for heart muscle damage Blood counts and electrolytes Vital signs. See below for your results today.  Chest Pain (Nonspecific)  HOME CARE INSTRUCTIONS  For the next few days, avoid physical activities that bring on chest pain. Continue physical activities as directed.  Do not smoke cigarettes or drink alcohol until your symptoms are gone. If you do smoke, it is time to quit. You may receive instructions and counseling on how to stop smoking. Only take over-the-counter or prescription medicine for pain, discomfort, or fever as directed by your caregiver.  Follow your caregiver's suggestions for further testing if your chest pain does not go away.  Keep any follow-up appointments you made. If you do not go to an appointment, you could develop lasting (chronic) problems with pain. If there is any problem keeping an appointment, you must call to reschedule.  SEEK MEDICAL CARE IF:  You think you are having problems from the medicine you are taking. Read your medicine instructions carefully.  Your chest pain does not go away, even after treatment.  You develop a rash with blisters on your chest.  SEEK IMMEDIATE MEDICAL CARE IF:  You have increased chest pain or pain that spreads to your arm, neck, jaw, back, or belly (abdomen).  You develop shortness of breath, an increasing cough, or you are coughing up blood.  You have severe back or abdominal pain, feel sick to your stomach (nauseous) or throw up (vomit).  You develop severe weakness, fainting, or chills.  You have an oral temperature above 102 F  (38.9 C), not controlled by medicine.  THIS IS AN EMERGENCY. Do not wait to see if the pain will go away. Get medical help at once. Call your local emergency services (911 in [place] Do not drive yourself to the hospital. Additional Information:  Your vital signs today were: BP (!) 140/92 (BP Location: Right Arm)    Pulse 79    Temp 99.1 F (37.3 C) (Oral)    Resp 20    Ht 5\' 4"  (1.626 m)    Wt 108.9 kg (240 lb)    SpO2 100%    BMI 41.20 kg/m  If your blood pressure (BP) was elevated above 135/85 this visit, please have this repeated by your doctor within one month. ---------------

## 2018-03-22 NOTE — ED Notes (Signed)
Declined W/C at D/C and was escorted to lobby by RN. 

## 2018-09-17 ENCOUNTER — Encounter (HOSPITAL_COMMUNITY): Payer: Self-pay | Admitting: *Deleted

## 2018-09-17 ENCOUNTER — Inpatient Hospital Stay (HOSPITAL_COMMUNITY)
Admission: AD | Admit: 2018-09-17 | Discharge: 2018-09-17 | Disposition: A | Discharge status: Home / Self Care | Payer: 59 | Source: Ambulatory Visit | Attending: Obstetrics and Gynecology | Admitting: Obstetrics and Gynecology

## 2018-09-17 DIAGNOSIS — N926 Irregular menstruation, unspecified: Secondary | ICD-10-CM

## 2018-09-17 DIAGNOSIS — N912 Amenorrhea, unspecified: Secondary | ICD-10-CM | POA: Diagnosis not present

## 2018-09-17 NOTE — MAU Note (Signed)
Pt wants pregnancy test, hasn't done HPT.  LMP was September 2, denies pain or bleeding.

## 2018-09-17 NOTE — Discharge Instructions (Signed)
We do not do pregnancy tests in the MAU. You can go to the [facility] downstairs for a pregnancy test. There is a walk in clinic from 4-7:30 pm. If you are pregnant, they can get you scheduled for your initial prenatal visit. Please return to the MAU if you have vaginal bleeding or pain associated with pregnancy.

## 2018-09-17 NOTE — MAU Provider Note (Signed)
  S:   27 y.o. [id number] presents to MAU for pregnancy confirmation.  She denies abdominal pain or vaginal bleeding today. She has not yet done HPT.   O: BP 138/65 (BP Location: Right Arm)   Pulse (!) 50   Temp 98.8 F (37.1 C) (Oral)   Resp 16   Ht 5\' 4"  (1.626 m)   Wt 115.7 kg   LMP 07/30/2018   BMI 43.79 kg/m  Physical Examination: General appearance - alert, well appearing, and in no distress, oriented to person, place, and time and acyanotic, in no respiratory distress  A: Missed menses  P: D/C home Informed pt we do not do pregnancy verifications in MAU Referred to [facility] for pregnancy test & verification Return to MAU as needed for pregnancy related emergencies  De Hollingshead, DO 3:10 PM

## 2018-09-27 DIAGNOSIS — Z888 Allergy status to other drugs, medicaments and biological substances status: Secondary | ICD-10-CM | POA: Insufficient documentation

## 2018-09-27 DIAGNOSIS — Z8742 Personal history of other diseases of the female genital tract: Secondary | ICD-10-CM | POA: Insufficient documentation

## 2018-09-27 DIAGNOSIS — Z8751 Personal history of pre-term labor: Secondary | ICD-10-CM | POA: Insufficient documentation

## 2018-10-03 DIAGNOSIS — O99210 Obesity complicating pregnancy, unspecified trimester: Secondary | ICD-10-CM | POA: Insufficient documentation

## 2018-10-03 DIAGNOSIS — N925 Other specified irregular menstruation: Secondary | ICD-10-CM | POA: Diagnosis not present

## 2018-10-03 DIAGNOSIS — Z124 Encounter for screening for malignant neoplasm of cervix: Secondary | ICD-10-CM | POA: Diagnosis not present

## 2018-10-03 LAB — OB RESULTS CONSOLE GC/CHLAMYDIA
Chlamydia: NEGATIVE
Gonorrhea: NEGATIVE

## 2018-10-03 LAB — OB RESULTS CONSOLE ANTIBODY SCREEN: Antibody Screen: NEGATIVE

## 2018-10-03 LAB — OB RESULTS CONSOLE ABO/RH: RH Type: POSITIVE

## 2018-10-03 LAB — OB RESULTS CONSOLE HEPATITIS B SURFACE ANTIGEN: Hepatitis B Surface Ag: NEGATIVE

## 2018-10-03 LAB — OB RESULTS CONSOLE RUBELLA ANTIBODY, IGM: Rubella: NON-IMMUNE/NOT IMMUNE

## 2018-10-03 LAB — OB RESULTS CONSOLE RPR: RPR: NONREACTIVE

## 2018-10-03 LAB — OB RESULTS CONSOLE HIV ANTIBODY (ROUTINE TESTING): HIV: NONREACTIVE

## 2018-10-08 DIAGNOSIS — Z789 Other specified health status: Secondary | ICD-10-CM | POA: Insufficient documentation

## 2018-10-12 DIAGNOSIS — Z8679 Personal history of other diseases of the circulatory system: Secondary | ICD-10-CM | POA: Insufficient documentation

## 2018-11-13 DIAGNOSIS — Z3482 Encounter for supervision of other normal pregnancy, second trimester: Secondary | ICD-10-CM | POA: Diagnosis not present

## 2018-11-13 DIAGNOSIS — O219 Vomiting of pregnancy, unspecified: Secondary | ICD-10-CM | POA: Diagnosis not present

## 2018-11-13 DIAGNOSIS — Z3A15 15 weeks gestation of pregnancy: Secondary | ICD-10-CM | POA: Diagnosis not present

## 2018-11-28 NOTE — L&D Delivery Note (Signed)
Delivery Note   Patient Name: Theresa Horne DOB: 25-Mar-1991 MRN: [id number]  Date of admission: 04/27/2019 Delivering MD: Dale Mayhill  Date of delivery: 04/27/19 Type of delivery: SVD  Newborn Data: Live born female  Birth Weight:   APGAR: 8, 9   Newborn Delivery   Birth date/time:  04/27/2019 17:14:00 Delivery type:      [name], 28 y.o., @ [redacted]w[redacted]d,  [phone], who was admitted for spontaneous labor. I was called to the room when she progressed +1 station @ 8cm dilated, RN stretched cervix to rim due to prolonged decels noted, FSE applied, pt just received epidural and was comfortable and making fast descent, pt in trendelenburg with left leg in the arm, baseline reported to 130s, Dr Mora Appl called and notified and asked to be reroute to the hospital, pt started to feel pressure,  in the second stage of labor, pt was a soft rim that was reducible with pushing.  She pushed for 5/min.  She delivered a viable infant, cephalic and restituted to the ROA position over an intact perineum.  A nuchal cord   was not identified. The baby was placed on maternal abdomen while initial step of NRP were perfmored (Dry, Stimulated, and warmed). Hat placed on baby for thermoregulation. Delayed cord clamping was performed for 2 minutes.  Cord double clamped and cut.  Cord cut by father. Apgar scores were 8 and 9. Prophylactic Pitocin was started in the third stage of labor for active management. The placenta delivered spontaneously, shultz, with a 3 vessel cord and was sent to LD.  Inspection revealed none. An examination of the vaginal vault and cervix was free from lacerations. The uterus was firm, bleeding stable.   Placenta and umbilical artery blood gas were not sent.  There were no complications during the procedure.  Mom and baby skin to skin following delivery. Left in stable condition.  Maternal Info: Anesthesia:Epidural Episiotomy: NO Lacerations:  No Suture Repair: None Est. Blood Loss  (mL):  100  Newborn Info:  Baby Sex: female Circumcision: In pt desired Babies Name: roula APGAR (1 MIN):  8 APGAR (5 MINS): 9  APGAR (10 MINS):    Mom to postpartum.  Baby to Couplet care / Skin to Skin.  Dr Mora Appl updated and aware of birth.   [place], [place], NP-C 04/27/19 5:52 PM

## 2018-12-03 IMAGING — DX DG CHEST 2V
2 series · 2 of 2 positions shown · non-contrast
Comparison: 01/10/2017.

CLINICAL DATA: Left-sided chest pain.

EXAM:
CHEST - 2 VIEW

[chest pa]
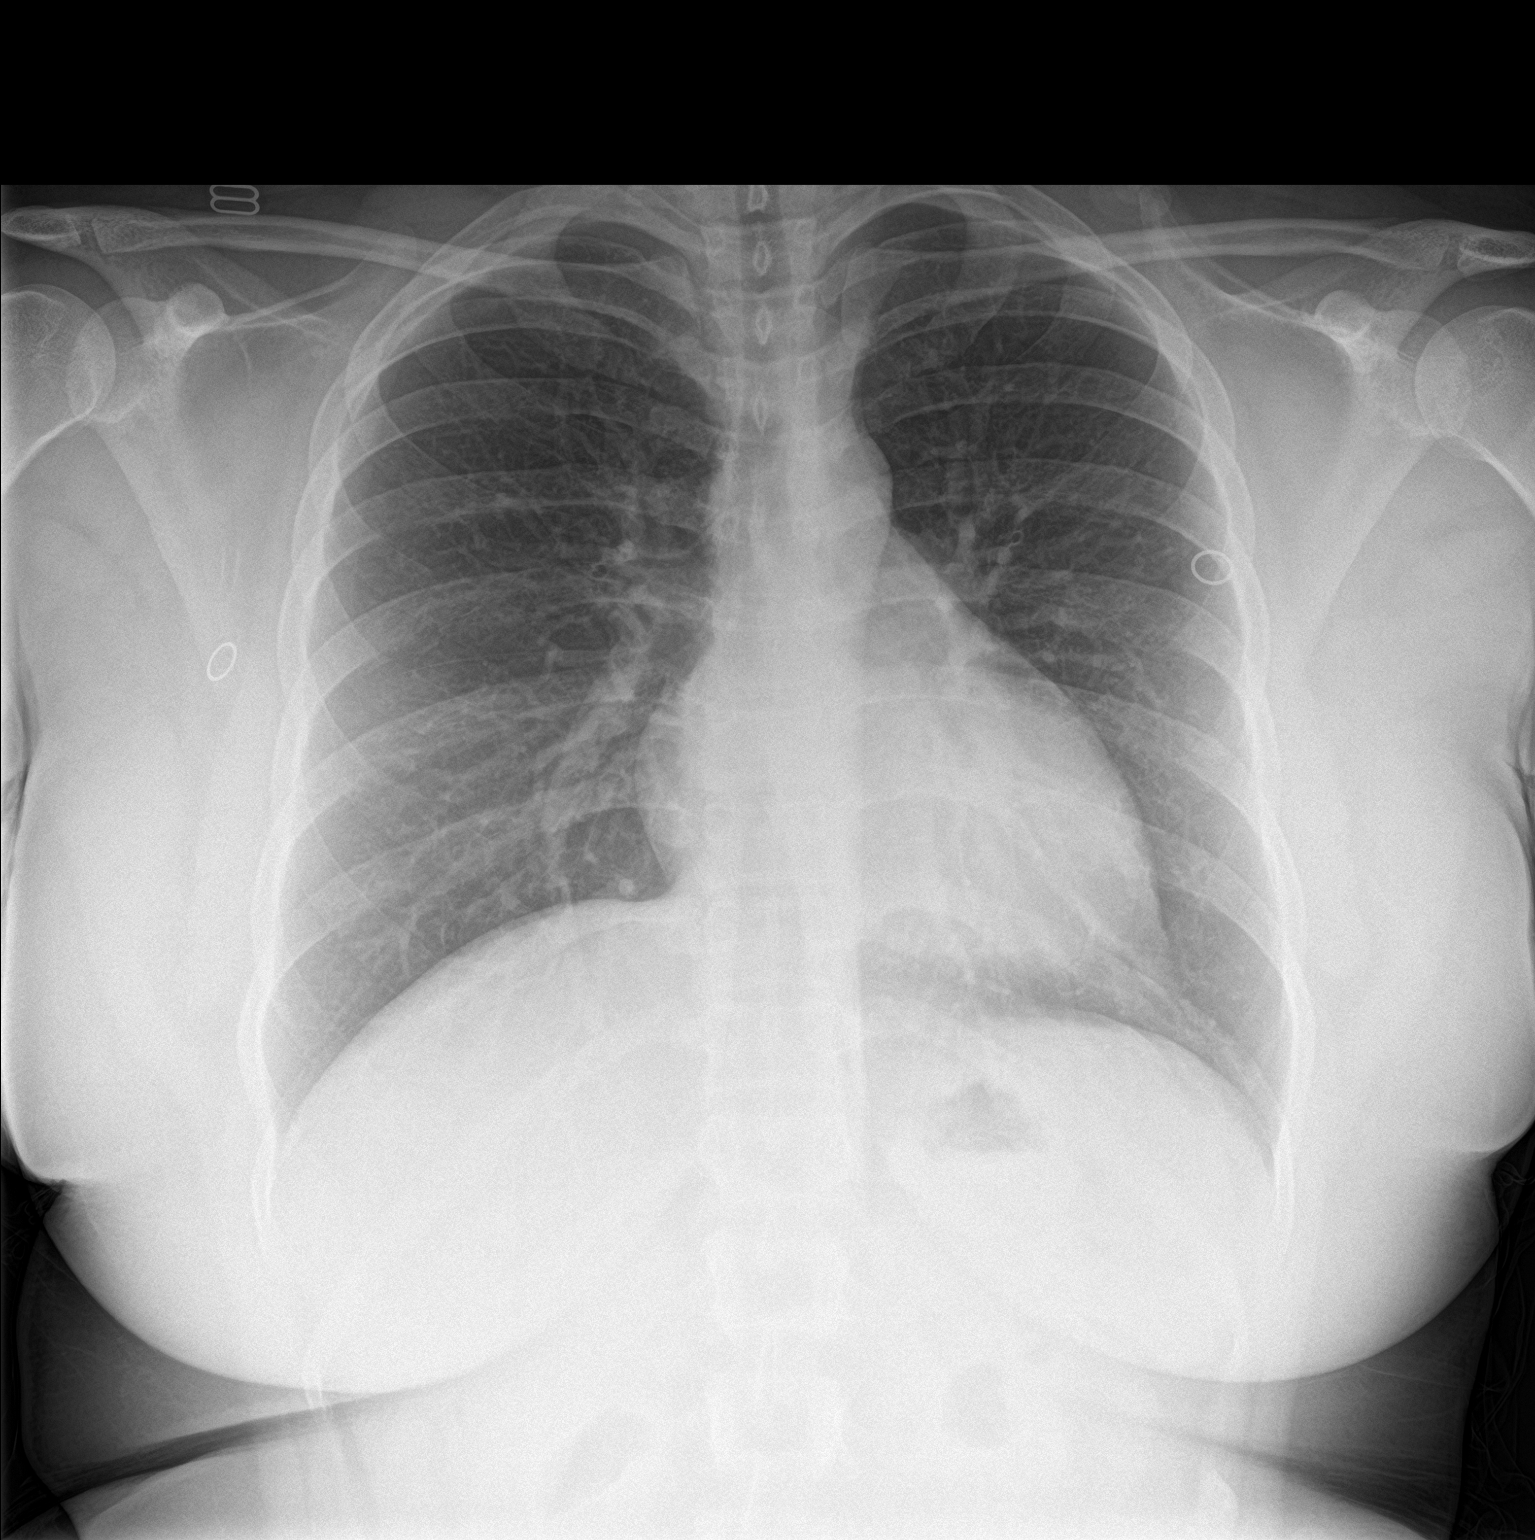

[chest lat]
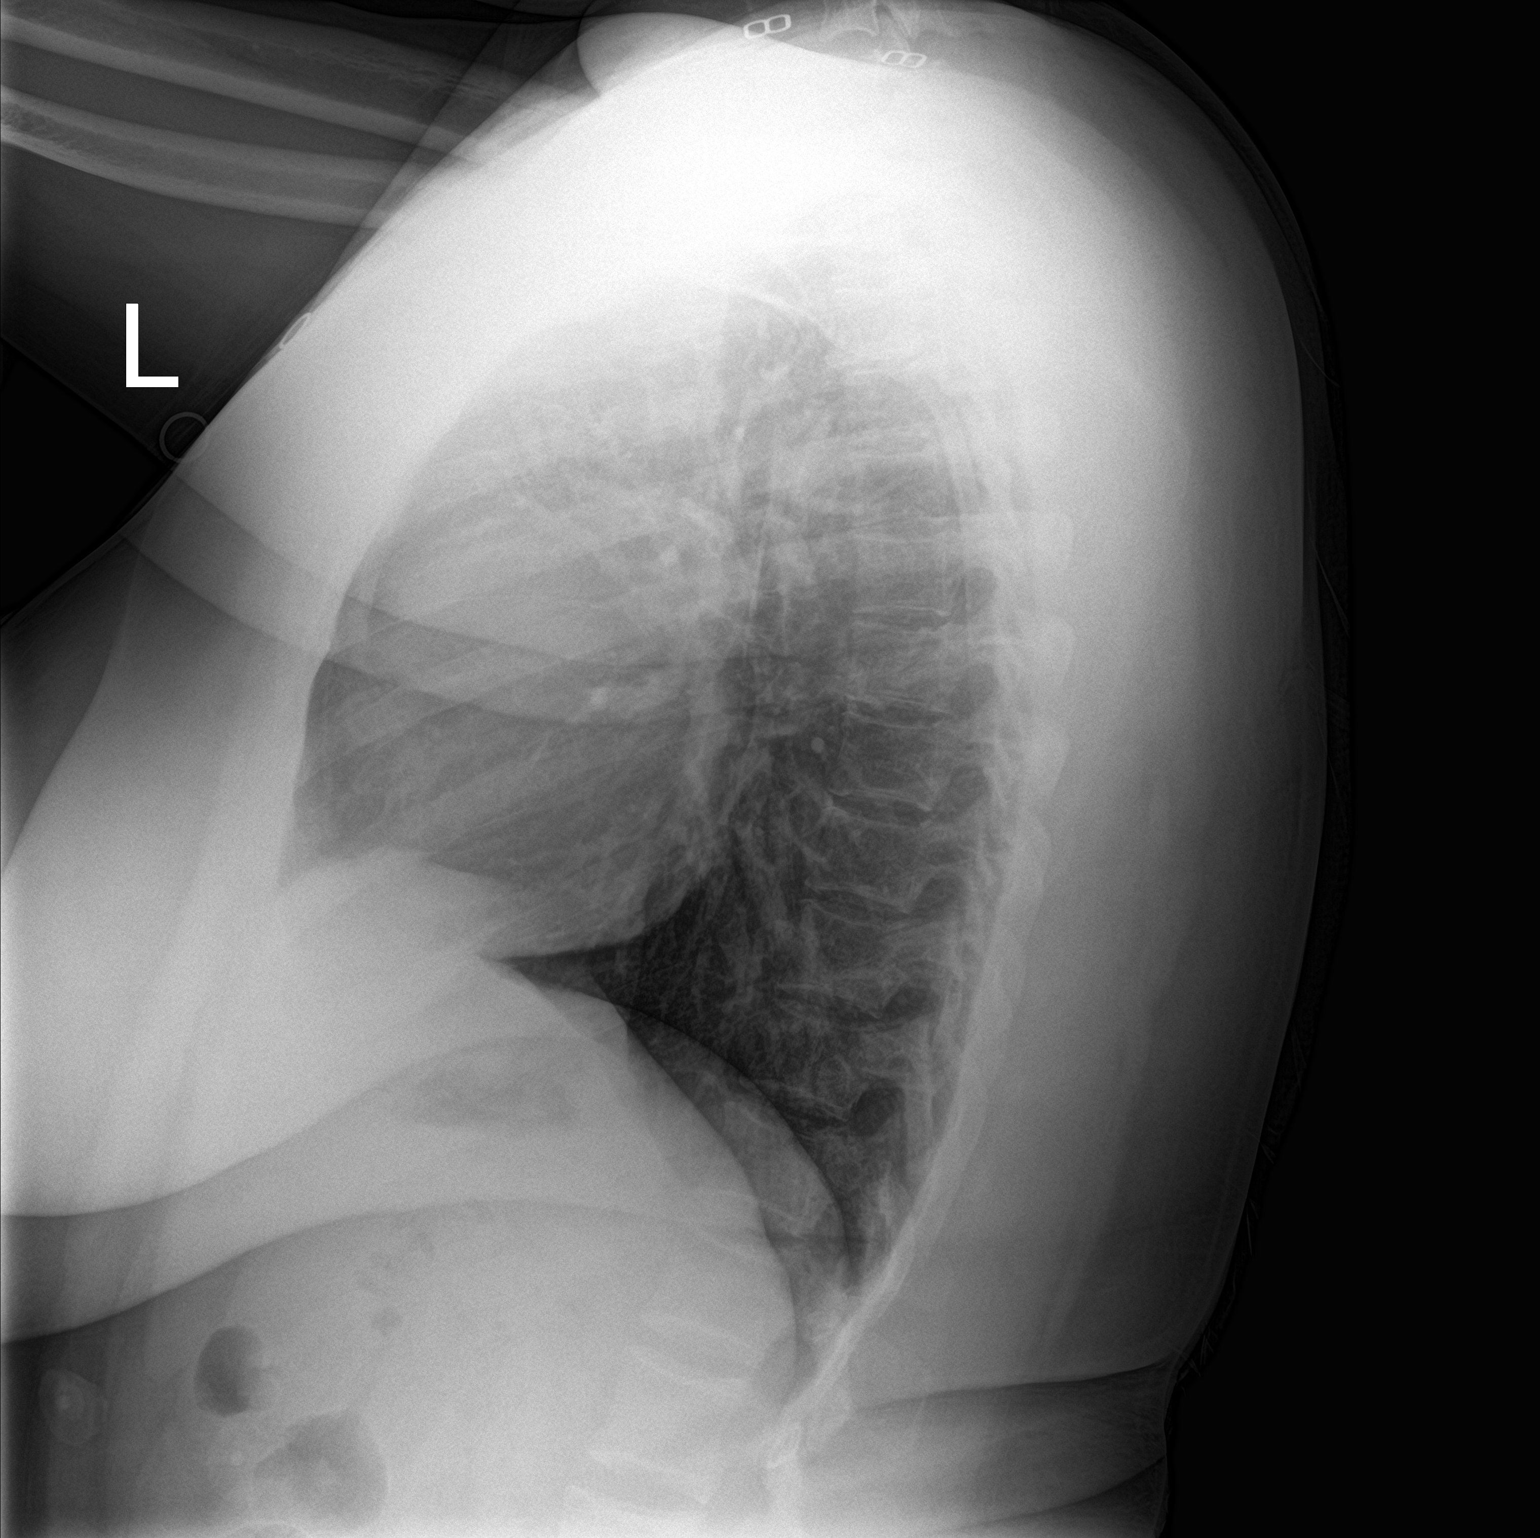

[2 of 2 positions shown; findings below may reference images not displayed]

FINDINGS: Mediastinum and hilar structures normal. Heart size normal. No focal
infiltrate. No pleural effusion or pneumothorax. No acute bony
abnormality.
IMPRESSION: No acute abnormality.  Chest is stable from prior exam

## 2018-12-05 DIAGNOSIS — Z3A18 18 weeks gestation of pregnancy: Secondary | ICD-10-CM | POA: Diagnosis not present

## 2018-12-05 DIAGNOSIS — Z8751 Personal history of pre-term labor: Secondary | ICD-10-CM | POA: Diagnosis not present

## 2018-12-05 DIAGNOSIS — Z3482 Encounter for supervision of other normal pregnancy, second trimester: Secondary | ICD-10-CM | POA: Diagnosis not present

## 2018-12-05 DIAGNOSIS — Z363 Encounter for antenatal screening for malformations: Secondary | ICD-10-CM | POA: Diagnosis not present

## 2018-12-05 DIAGNOSIS — O219 Vomiting of pregnancy, unspecified: Secondary | ICD-10-CM | POA: Diagnosis not present

## 2018-12-21 DIAGNOSIS — Z8751 Personal history of pre-term labor: Secondary | ICD-10-CM | POA: Diagnosis not present

## 2018-12-28 DIAGNOSIS — Z8751 Personal history of pre-term labor: Secondary | ICD-10-CM | POA: Diagnosis not present

## 2018-12-28 DIAGNOSIS — Z3492 Encounter for supervision of normal pregnancy, unspecified, second trimester: Secondary | ICD-10-CM | POA: Diagnosis not present

## 2018-12-28 DIAGNOSIS — Z3A22 22 weeks gestation of pregnancy: Secondary | ICD-10-CM | POA: Diagnosis not present

## 2019-01-04 DIAGNOSIS — Z8751 Personal history of pre-term labor: Secondary | ICD-10-CM | POA: Diagnosis not present

## 2019-01-09 DIAGNOSIS — O99212 Obesity complicating pregnancy, second trimester: Secondary | ICD-10-CM | POA: Diagnosis not present

## 2019-01-09 DIAGNOSIS — Z8751 Personal history of pre-term labor: Secondary | ICD-10-CM | POA: Diagnosis not present

## 2019-01-09 DIAGNOSIS — Z3A23 23 weeks gestation of pregnancy: Secondary | ICD-10-CM | POA: Diagnosis not present

## 2019-01-17 DIAGNOSIS — Z3A24 24 weeks gestation of pregnancy: Secondary | ICD-10-CM | POA: Diagnosis not present

## 2019-01-17 DIAGNOSIS — O99212 Obesity complicating pregnancy, second trimester: Secondary | ICD-10-CM | POA: Diagnosis not present

## 2019-01-17 DIAGNOSIS — Z8751 Personal history of pre-term labor: Secondary | ICD-10-CM | POA: Diagnosis not present

## 2019-01-24 DIAGNOSIS — Z8751 Personal history of pre-term labor: Secondary | ICD-10-CM | POA: Diagnosis not present

## 2019-01-31 DIAGNOSIS — Z8751 Personal history of pre-term labor: Secondary | ICD-10-CM | POA: Diagnosis not present

## 2019-02-07 DIAGNOSIS — Z8751 Personal history of pre-term labor: Secondary | ICD-10-CM | POA: Diagnosis not present

## 2019-02-15 DIAGNOSIS — Z369 Encounter for antenatal screening, unspecified: Secondary | ICD-10-CM | POA: Diagnosis not present

## 2019-02-15 DIAGNOSIS — Z3482 Encounter for supervision of other normal pregnancy, second trimester: Secondary | ICD-10-CM | POA: Diagnosis not present

## 2019-02-15 DIAGNOSIS — Z8751 Personal history of pre-term labor: Secondary | ICD-10-CM | POA: Diagnosis not present

## 2019-02-15 DIAGNOSIS — Z3483 Encounter for supervision of other normal pregnancy, third trimester: Secondary | ICD-10-CM | POA: Diagnosis not present

## 2019-02-15 DIAGNOSIS — Z3A28 28 weeks gestation of pregnancy: Secondary | ICD-10-CM | POA: Diagnosis not present

## 2019-02-15 DIAGNOSIS — O99213 Obesity complicating pregnancy, third trimester: Secondary | ICD-10-CM | POA: Diagnosis not present

## 2019-02-21 DIAGNOSIS — Z8751 Personal history of pre-term labor: Secondary | ICD-10-CM | POA: Diagnosis not present

## 2019-02-28 DIAGNOSIS — O09219 Supervision of pregnancy with history of pre-term labor, unspecified trimester: Secondary | ICD-10-CM | POA: Diagnosis not present

## 2019-02-28 DIAGNOSIS — Z8751 Personal history of pre-term labor: Secondary | ICD-10-CM | POA: Diagnosis not present

## 2019-02-28 DIAGNOSIS — Z3A30 30 weeks gestation of pregnancy: Secondary | ICD-10-CM | POA: Diagnosis not present

## 2019-02-28 DIAGNOSIS — Z3493 Encounter for supervision of normal pregnancy, unspecified, third trimester: Secondary | ICD-10-CM | POA: Diagnosis not present

## 2019-03-07 DIAGNOSIS — Z8751 Personal history of pre-term labor: Secondary | ICD-10-CM | POA: Diagnosis not present

## 2019-03-12 ENCOUNTER — Other Ambulatory Visit: Payer: Self-pay

## 2019-03-12 ENCOUNTER — Inpatient Hospital Stay (HOSPITAL_COMMUNITY)
Admission: AD | Admit: 2019-03-12 | Discharge: 2019-03-12 | Disposition: A | Discharge status: Home / Self Care | Payer: 59 | Attending: Obstetrics & Gynecology | Admitting: Obstetrics & Gynecology

## 2019-03-12 ENCOUNTER — Encounter (HOSPITAL_COMMUNITY): Payer: Self-pay | Admitting: *Deleted

## 2019-03-12 DIAGNOSIS — N898 Other specified noninflammatory disorders of vagina: Secondary | ICD-10-CM | POA: Diagnosis not present

## 2019-03-12 DIAGNOSIS — O163 Unspecified maternal hypertension, third trimester: Secondary | ICD-10-CM | POA: Insufficient documentation

## 2019-03-12 DIAGNOSIS — O4702 False labor before 37 completed weeks of gestation, second trimester: Secondary | ICD-10-CM | POA: Diagnosis not present

## 2019-03-12 DIAGNOSIS — O26893 Other specified pregnancy related conditions, third trimester: Secondary | ICD-10-CM | POA: Diagnosis not present

## 2019-03-12 DIAGNOSIS — Z8249 Family history of ischemic heart disease and other diseases of the circulatory system: Secondary | ICD-10-CM | POA: Insufficient documentation

## 2019-03-12 DIAGNOSIS — Z87891 Personal history of nicotine dependence: Secondary | ICD-10-CM | POA: Insufficient documentation

## 2019-03-12 DIAGNOSIS — Z791 Long term (current) use of non-steroidal anti-inflammatories (NSAID): Secondary | ICD-10-CM | POA: Insufficient documentation

## 2019-03-12 DIAGNOSIS — N949 Unspecified condition associated with female genital organs and menstrual cycle: Secondary | ICD-10-CM | POA: Diagnosis not present

## 2019-03-12 DIAGNOSIS — R102 Pelvic and perineal pain: Secondary | ICD-10-CM | POA: Diagnosis not present

## 2019-03-12 DIAGNOSIS — O09213 Supervision of pregnancy with history of pre-term labor, third trimester: Secondary | ICD-10-CM | POA: Diagnosis not present

## 2019-03-12 DIAGNOSIS — Z3A32 32 weeks gestation of pregnancy: Secondary | ICD-10-CM | POA: Insufficient documentation

## 2019-03-12 LAB — URINALYSIS, ROUTINE W REFLEX MICROSCOPIC
Bilirubin Urine: NEGATIVE
Glucose, UA: NEGATIVE mg/dL
Ketones, ur: NEGATIVE mg/dL
Leukocytes,Ua: NEGATIVE
Nitrite: NEGATIVE
Protein, ur: NEGATIVE mg/dL
Specific Gravity, Urine: 1.018 (ref 1.005–1.030)
pH: 6 (ref 5.0–8.0)

## 2019-03-12 LAB — WET PREP, GENITAL
Clue Cells Wet Prep HPF POC: NONE SEEN
Sperm: NONE SEEN
Trich, Wet Prep: NONE SEEN
Yeast Wet Prep HPF POC: NONE SEEN

## 2019-03-12 LAB — FETAL FIBRONECTIN: Fetal Fibronectin: NEGATIVE

## 2019-03-12 NOTE — MAU Note (Signed)
Pt states her mucus plug came out this morning, also a lot of pelvic pressure.  Lifting her legs & walking hurts.  Denies bleeding or LOF.  Reports good fetal movement.

## 2019-03-12 NOTE — Discharge Instructions (Signed)

## 2019-03-12 NOTE — MAU Provider Note (Signed)
History     CSN: [id number]  Arrival date and time: 03/12/19 1659   First Provider Initiated Contact with Patient 03/12/19 1801      Chief Complaint  Patient presents with  . Vaginal Discharge  . pelvic pressure   [name] is a 28 y.o. [id number] at [redacted]w[redacted]d who presents to MAU for a lost mucus plug and pelvic pressure. Pt reports thick, slimy mucus discharge x1. Pt denies brown/red spots in mucus plug.  Onset: last night for pressure, this morning for mucus plug Location: vaginal pressure Duration: <24hrs Character: "pressure, difficulty walking, affects me moving because there is so much pressure" Aggravating/Associated: walking, standing/difficulty walking Relieving: resting Treatment: none Severity: pt reports no pain, only pressure  Pt reports nothing in the vagina for the past 24hrs.  Pt denies VB, LOF, ctx, decreased FM, vaginal discharge/odor/itching. Pt denies N/V, abdominal pain, constipation, diarrhea, or urinary problems. Pt denies fever, chills, fatigue, sweating or changes in appetite. Pt denies SOB or chest pain. Pt denies dizziness, HA, light-headedness, weakness.  Problems this pregnancy include: hx PTL Allergies? NKDA Current medications/supplements? Makena, PNVs Prenatal care provider? [facility], next appt 03/14/2019   OB History    Gravida  4   Para  2   Term  1   Preterm  1   AB  1   Living  2     SAB      TAB  1   Ectopic      Multiple  0   Live Births  2           Past Medical History:  Diagnosis Date  . Hypertension   . Preterm labor     Past Surgical History:  Procedure Laterality Date  . INDUCED ABORTION      Family History  Problem Relation Age of Onset  . Hypertension Father   . Diabetes Paternal Uncle   . Renal Disease Maternal Grandmother     Social History   Tobacco Use  . Smoking status: Former Smoker    Packs/day: 0.00    Types: Cigarettes    Last attempt to quit: 01/26/2015    Years since  quitting: 4.1  . Smokeless tobacco: Never Used  Substance Use Topics  . Alcohol use: Not Currently    Comment: social use before preg  . Drug use: No    Allergies: No Known Allergies  Medications Prior to Admission  Medication Sig Dispense Refill Last Dose  . calcium carbonate (TUMS - DOSED IN MG ELEMENTAL CALCIUM) 500 MG chewable tablet Chew 1 tablet by mouth daily.   03/12/2019 at Unknown time  . Prenatal Vit-Fe Fumarate-FA (PRENATAL MULTIVITAMIN) TABS tablet Take 1 tablet by mouth daily at 12 noon.   03/12/2019 at Unknown time  . promethazine (PHENERGAN) 25 MG tablet Take 25 mg by mouth every 6 (six) hours as needed for nausea or vomiting.   Past Week at Unknown time  . cyclobenzaprine (FLEXERIL) 5 MG tablet Take 1 tablet (5 mg total) by mouth 3 (three) times daily as needed for muscle spasms. (Patient not taking: Reported on 11/15/2017) 10 tablet 0 Not Taking at Unknown time  . naproxen (NAPROSYN) 375 MG tablet Take 1 tablet (375 mg total) by mouth 2 (two) times daily. 20 tablet 0   . nitrofurantoin, macrocrystal-monohydrate, (MACROBID) 100 MG capsule Take 1 capsule (100 mg total) by mouth 2 (two) times daily. (Patient not taking: Reported on 03/22/2018) 10 capsule 0 Not Taking at Unknown time  . triamcinolone (  KENALOG) 0.025 % ointment Apply 1 application topically 2 (two) times daily. (Patient not taking: Reported on 01/10/2017) 30 g 0 Not Taking at Unknown time    Review of Systems  Constitutional: Negative for appetite change, chills, diaphoresis, fatigue and fever.  Respiratory: Negative for shortness of breath.   Cardiovascular: Negative for chest pain.  Gastrointestinal: Negative for abdominal pain, constipation, diarrhea, nausea and vomiting.  Genitourinary: Positive for vaginal discharge and vaginal pain. Negative for dysuria, flank pain, frequency, pelvic pain, urgency and vaginal bleeding.  Neurological: Negative for dizziness, weakness, light-headedness and headaches.    Physical Exam   Blood pressure 129/69, pulse (!) 54, temperature 98.3 F (36.8 C), temperature source Oral, resp. rate 16, last menstrual period 07/30/2018, unknown if currently breastfeeding.  Patient Vitals for the past 24 hrs:  BP Temp Temp src Pulse Resp  03/12/19 2011 129/69 98.3 F (36.8 C) Oral (!) 54 16  03/12/19 1745 129/76 98.6 F (37 C) Oral (!) 55 18    Physical Exam  Constitutional: She is oriented to person, place, and time. She appears well-developed and well-nourished. No distress.  HENT:  Head: Normocephalic and atraumatic.  Respiratory: Effort normal.  GI: Soft. She exhibits no distension and no mass. There is no abdominal tenderness. There is no rebound and no guarding.  Genitourinary:    Vagina normal.  There is no rash, tenderness or lesion on the right labia. There is no rash, tenderness or lesion on the left labia. Cervix exhibits discharge (mucus). Cervix exhibits no motion tenderness and no friability.    No vaginal discharge or bleeding.  No bleeding in the vagina.    Genitourinary Comments: CE: soft/posterior/closed   Neurological: She is alert and oriented to person, place, and time.  Skin: Skin is warm and dry. She is not diaphoretic.  Psychiatric: She has a normal mood and affect. Her behavior is normal.   Results for orders placed or performed during the hospital encounter of 03/12/19 (from the past 24 hour(s))  Urinalysis, Routine w reflex microscopic     Status: Abnormal   Collection Time: 03/12/19  5:47 PM  Result Value Ref Range   Color, Urine YELLOW YELLOW   APPearance HAZY (A) CLEAR   Specific Gravity, Urine 1.018 1.005 - 1.030   pH 6.0 5.0 - 8.0   Glucose, UA NEGATIVE NEGATIVE mg/dL   Hgb urine dipstick SMALL (A) NEGATIVE   Bilirubin Urine NEGATIVE NEGATIVE   Ketones, ur NEGATIVE NEGATIVE mg/dL   Protein, ur NEGATIVE NEGATIVE mg/dL   Nitrite NEGATIVE NEGATIVE   Leukocytes,Ua NEGATIVE NEGATIVE   RBC / HPF 0-5 0 - 5 RBC/hpf   WBC, UA  0-5 0 - 5 WBC/hpf   Bacteria, UA FEW (A) NONE SEEN   Squamous Epithelial / LPF 6-10 0 - 5   Mucus PRESENT   Fetal fibronectin     Status: None   Collection Time: 03/12/19  6:19 PM  Result Value Ref Range   Fetal Fibronectin NEGATIVE NEGATIVE  Wet prep, genital     Status: Abnormal   Collection Time: 03/12/19  6:19 PM  Result Value Ref Range   Yeast Wet Prep HPF POC NONE SEEN NONE SEEN   Trich, Wet Prep NONE SEEN NONE SEEN   Clue Cells Wet Prep HPF POC NONE SEEN NONE SEEN   WBC, Wet Prep HPF POC MODERATE (A) NONE SEEN   Sperm NONE SEEN    .  MAU Course  Procedures  MDM -r/o PTL       -  CE: soft/posterior/closed       -fFN: negative       -WetPrep: +WBCs only       -GC/CT collected       -UA: hazy/sm hgb/few bacteria       -CE: unchanged at end of MAU visit -leg pressure       -head engaged in maternal pelvis -EFM: reactive with few variables       -baseline: 140       -variability: moderate       -accels: present, 15x15       -decels: few variable       -TOCO: no ctx -pt discharged to home in stable condition  Orders Placed This Encounter  Procedures  . Wet prep, genital    Standing Status:   Standing    Number of Occurrences:   1  . Urinalysis, Routine w reflex microscopic    Standing Status:   Standing    Number of Occurrences:   1  . Fetal fibronectin    Standing Status:   Standing    Number of Occurrences:   1  . Discharge patient    Order Specific Question:   Discharge disposition    Answer:   01-Home or Self Care [1]    Order Specific Question:   Discharge patient date    Answer:   03/12/2019   No orders of the defined types were placed in this encounter.  Assessment and Plan   1. Pelvic pressure in pregnancy, antepartum, third trimester   2. [redacted] weeks gestation of pregnancy    Allergies as of 03/12/2019   No Known Allergies     Medication List    STOP taking these medications   naproxen 375 MG tablet Commonly known as:  NAPROSYN    nitrofurantoin (macrocrystal-monohydrate) 100 MG capsule Commonly known as:  MACROBID     TAKE these medications   calcium carbonate 500 MG chewable tablet Commonly known as:  TUMS - dosed in mg elemental calcium Chew 1 tablet by mouth daily.   cyclobenzaprine 5 MG tablet Commonly known as:  FLEXERIL Take 1 tablet (5 mg total) by mouth 3 (three) times daily as needed for muscle spasms.   prenatal multivitamin Tabs tablet Take 1 tablet by mouth daily at 12 noon.   promethazine 25 MG tablet Commonly known as:  PHENERGAN Take 25 mg by mouth every 6 (six) hours as needed for nausea or vomiting.   triamcinolone 0.025 % ointment Commonly known as:  KENALOG Apply 1 application topically 2 (two) times daily.       -advised pregnancy belt, hydration, rest for leg pressure -PTL/return MAU precautions given -pt instructed to keep appt with [facility] on Thursday 03/14/2019 -pt discharged to home in stable condition  Joni Reining E  03/12/2019, 8:16 PM

## 2019-03-13 LAB — GC/CHLAMYDIA PROBE AMP (~~LOC~~) NOT AT ARMC
Chlamydia: NEGATIVE
Neisseria Gonorrhea: NEGATIVE

## 2019-03-14 DIAGNOSIS — Z8751 Personal history of pre-term labor: Secondary | ICD-10-CM | POA: Diagnosis not present

## 2019-03-14 DIAGNOSIS — Z3A32 32 weeks gestation of pregnancy: Secondary | ICD-10-CM | POA: Diagnosis not present

## 2019-03-14 DIAGNOSIS — Z3A33 33 weeks gestation of pregnancy: Secondary | ICD-10-CM | POA: Diagnosis not present

## 2019-03-14 DIAGNOSIS — O99212 Obesity complicating pregnancy, second trimester: Secondary | ICD-10-CM | POA: Diagnosis not present

## 2019-03-14 DIAGNOSIS — O99213 Obesity complicating pregnancy, third trimester: Secondary | ICD-10-CM | POA: Diagnosis not present

## 2019-03-21 DIAGNOSIS — O99213 Obesity complicating pregnancy, third trimester: Secondary | ICD-10-CM | POA: Diagnosis not present

## 2019-03-21 DIAGNOSIS — Z3A32 32 weeks gestation of pregnancy: Secondary | ICD-10-CM | POA: Diagnosis not present

## 2019-03-28 DIAGNOSIS — R8271 Bacteriuria: Secondary | ICD-10-CM | POA: Diagnosis not present

## 2019-03-28 DIAGNOSIS — O99213 Obesity complicating pregnancy, third trimester: Secondary | ICD-10-CM | POA: Diagnosis not present

## 2019-03-28 DIAGNOSIS — Z3A34 34 weeks gestation of pregnancy: Secondary | ICD-10-CM | POA: Diagnosis not present

## 2019-03-28 DIAGNOSIS — D473 Essential (hemorrhagic) thrombocythemia: Secondary | ICD-10-CM | POA: Diagnosis not present

## 2019-04-03 DIAGNOSIS — Z8742 Personal history of other diseases of the female genital tract: Secondary | ICD-10-CM | POA: Diagnosis not present

## 2019-04-03 DIAGNOSIS — Z3686 Encounter for antenatal screening for cervical length: Secondary | ICD-10-CM | POA: Diagnosis not present

## 2019-04-03 DIAGNOSIS — Z3492 Encounter for supervision of normal pregnancy, unspecified, second trimester: Secondary | ICD-10-CM | POA: Diagnosis not present

## 2019-04-03 DIAGNOSIS — Z8751 Personal history of pre-term labor: Secondary | ICD-10-CM | POA: Diagnosis not present

## 2019-04-03 DIAGNOSIS — Z0184 Encounter for antibody response examination: Secondary | ICD-10-CM | POA: Diagnosis not present

## 2019-04-03 DIAGNOSIS — O99213 Obesity complicating pregnancy, third trimester: Secondary | ICD-10-CM | POA: Diagnosis not present

## 2019-04-03 DIAGNOSIS — Z8679 Personal history of other diseases of the circulatory system: Secondary | ICD-10-CM | POA: Diagnosis not present

## 2019-04-10 DIAGNOSIS — Z3493 Encounter for supervision of normal pregnancy, unspecified, third trimester: Secondary | ICD-10-CM | POA: Diagnosis not present

## 2019-04-10 DIAGNOSIS — Z3A36 36 weeks gestation of pregnancy: Secondary | ICD-10-CM | POA: Diagnosis not present

## 2019-04-10 DIAGNOSIS — O99213 Obesity complicating pregnancy, third trimester: Secondary | ICD-10-CM | POA: Diagnosis not present

## 2019-04-11 DIAGNOSIS — Z22330 Carrier of Group B streptococcus: Secondary | ICD-10-CM | POA: Insufficient documentation

## 2019-04-18 DIAGNOSIS — O99213 Obesity complicating pregnancy, third trimester: Secondary | ICD-10-CM | POA: Diagnosis not present

## 2019-04-18 DIAGNOSIS — Z3A37 37 weeks gestation of pregnancy: Secondary | ICD-10-CM | POA: Diagnosis not present

## 2019-04-23 DIAGNOSIS — O99213 Obesity complicating pregnancy, third trimester: Secondary | ICD-10-CM | POA: Diagnosis not present

## 2019-04-23 DIAGNOSIS — Z3A38 38 weeks gestation of pregnancy: Secondary | ICD-10-CM | POA: Diagnosis not present

## 2019-04-24 ENCOUNTER — Telehealth (HOSPITAL_COMMUNITY): Payer: Self-pay | Admitting: *Deleted

## 2019-04-24 ENCOUNTER — Encounter (HOSPITAL_COMMUNITY): Payer: Self-pay | Admitting: *Deleted

## 2019-04-24 NOTE — Telephone Encounter (Signed)
Preadmission screen  

## 2019-04-27 ENCOUNTER — Other Ambulatory Visit: Payer: Self-pay

## 2019-04-27 ENCOUNTER — Inpatient Hospital Stay (HOSPITAL_COMMUNITY)
Admission: AD | Admit: 2019-04-27 | Discharge: 2019-04-29 | DRG: 807 | Disposition: A | Discharge status: Home / Self Care | Payer: 59 | Attending: Obstetrics & Gynecology | Admitting: Obstetrics & Gynecology

## 2019-04-27 ENCOUNTER — Encounter (HOSPITAL_COMMUNITY): Payer: Self-pay

## 2019-04-27 ENCOUNTER — Inpatient Hospital Stay (HOSPITAL_COMMUNITY): Payer: 59 | Admitting: Anesthesiology

## 2019-04-27 DIAGNOSIS — O99824 Streptococcus B carrier state complicating childbirth: Principal | ICD-10-CM | POA: Diagnosis present

## 2019-04-27 DIAGNOSIS — Z3A38 38 weeks gestation of pregnancy: Secondary | ICD-10-CM

## 2019-04-27 DIAGNOSIS — Z3A00 Weeks of gestation of pregnancy not specified: Secondary | ICD-10-CM | POA: Diagnosis not present

## 2019-04-27 DIAGNOSIS — Z87891 Personal history of nicotine dependence: Secondary | ICD-10-CM | POA: Diagnosis not present

## 2019-04-27 DIAGNOSIS — O26893 Other specified pregnancy related conditions, third trimester: Secondary | ICD-10-CM | POA: Diagnosis present

## 2019-04-27 DIAGNOSIS — Z1159 Encounter for screening for other viral diseases: Secondary | ICD-10-CM

## 2019-04-27 DIAGNOSIS — O99214 Obesity complicating childbirth: Secondary | ICD-10-CM | POA: Diagnosis present

## 2019-04-27 DIAGNOSIS — Z880 Allergy status to penicillin: Secondary | ICD-10-CM

## 2019-04-27 DIAGNOSIS — O164 Unspecified maternal hypertension, complicating childbirth: Secondary | ICD-10-CM | POA: Diagnosis not present

## 2019-04-27 LAB — SARS CORONAVIRUS 2 BY RT PCR (HOSPITAL ORDER, PERFORMED IN ~~LOC~~ HOSPITAL LAB): SARS Coronavirus 2: NEGATIVE

## 2019-04-27 LAB — CBC
HCT: 33.2 % — ABNORMAL LOW (ref 36.0–46.0)
Hemoglobin: 10.7 g/dL — ABNORMAL LOW (ref 12.0–15.0)
MCH: 27 pg (ref 26.0–34.0)
MCHC: 32.2 g/dL (ref 30.0–36.0)
MCV: 83.8 fL (ref 80.0–100.0)
Platelets: 144 10*3/uL — ABNORMAL LOW (ref 150–400)
RBC: 3.96 MIL/uL (ref 3.87–5.11)
RDW: 14.6 % (ref 11.5–15.5)
WBC: 8.2 10*3/uL (ref 4.0–10.5)
nRBC: 0 % (ref 0.0–0.2)

## 2019-04-27 LAB — ABO/RH: ABO/RH(D): O POS

## 2019-04-27 LAB — TYPE AND SCREEN
ABO/RH(D): O POS
Antibody Screen: NEGATIVE

## 2019-04-27 MED ORDER — DIPHENHYDRAMINE HCL 50 MG/ML IJ SOLN: 12.5000 mg | INTRAMUSCULAR | Status: DC | PRN

## 2019-04-27 MED ORDER — DIBUCAINE (PERIANAL) 1 % EX OINT: 1.0000 "application " | TOPICAL_OINTMENT | CUTANEOUS | Status: DC | PRN

## 2019-04-27 MED ORDER — ACETAMINOPHEN 325 MG PO TABS
650.0000 mg | ORAL_TABLET | ORAL | Status: DC | PRN
  Administered 2019-04-28 – 2019-04-29 (×4): 650 mg via ORAL
  Filled 2019-04-27 (×4): qty 2

## 2019-04-27 MED ORDER — PHENYLEPHRINE 40 MCG/ML (10ML) SYRINGE FOR IV PUSH (FOR BLOOD PRESSURE SUPPORT): 80.0000 ug | PREFILLED_SYRINGE | INTRAVENOUS | Status: DC | PRN

## 2019-04-27 MED ORDER — LACTATED RINGERS IV SOLN
500.0000 mL | INTRAVENOUS | Status: DC | PRN
  Administered 2019-04-27: 1000 mL via INTRAVENOUS

## 2019-04-27 MED ORDER — OXYTOCIN 40 UNITS IN NORMAL SALINE INFUSION - SIMPLE MED
2.5000 [IU]/h | INTRAVENOUS | Status: DC
  Filled 2019-04-27: qty 1000

## 2019-04-27 MED ORDER — SOD CITRATE-CITRIC ACID 500-334 MG/5ML PO SOLN
30.0000 mL | ORAL | Status: DC | PRN
  Administered 2019-04-27: 30 mL via ORAL
  Filled 2019-04-27: qty 30

## 2019-04-27 MED ORDER — ONDANSETRON HCL 4 MG/2ML IJ SOLN: 4.0000 mg | Freq: Four times a day (QID) | INTRAMUSCULAR | Status: DC | PRN

## 2019-04-27 MED ORDER — COCONUT OIL OIL: 1.0000 "application " | TOPICAL_OIL | Status: DC | PRN

## 2019-04-27 MED ORDER — IBUPROFEN 600 MG PO TABS
600.0000 mg | ORAL_TABLET | Freq: Four times a day (QID) | ORAL | Status: DC
  Administered 2019-04-27 – 2019-04-29 (×6): 600 mg via ORAL
  Filled 2019-04-27 (×7): qty 1

## 2019-04-27 MED ORDER — PENICILLIN G 3 MILLION UNITS IVPB - SIMPLE MED
3.0000 10*6.[IU] | INTRAVENOUS | Status: DC
  Administered 2019-04-27: 15:00:00 3 10*6.[IU] via INTRAVENOUS
  Filled 2019-04-27: qty 100

## 2019-04-27 MED ORDER — LIDOCAINE-EPINEPHRINE (PF) 2 %-1:200000 IJ SOLN
INTRAMUSCULAR | Status: DC | PRN
  Administered 2019-04-27 (×2): 3 mL via EPIDURAL

## 2019-04-27 MED ORDER — LACTATED RINGERS IV SOLN
500.0000 mL | Freq: Once | INTRAVENOUS | Status: AC
  Administered 2019-04-27: 500 mL via INTRAVENOUS

## 2019-04-27 MED ORDER — ONDANSETRON HCL 4 MG PO TABS: 4.0000 mg | ORAL_TABLET | ORAL | Status: DC | PRN

## 2019-04-27 MED ORDER — FENTANYL CITRATE (PF) 100 MCG/2ML IJ SOLN
100.0000 ug | INTRAMUSCULAR | Status: DC | PRN
  Administered 2019-04-27: 100 ug via INTRAVENOUS
  Filled 2019-04-27: qty 2

## 2019-04-27 MED ORDER — OXYCODONE-ACETAMINOPHEN 5-325 MG PO TABS: 1.0000 | ORAL_TABLET | ORAL | Status: DC | PRN

## 2019-04-27 MED ORDER — PRENATAL MULTIVITAMIN CH
1.0000 | ORAL_TABLET | Freq: Every day | ORAL | Status: DC
  Administered 2019-04-28 – 2019-04-29 (×2): 1 via ORAL
  Filled 2019-04-27 (×2): qty 1

## 2019-04-27 MED ORDER — ACETAMINOPHEN 325 MG PO TABS: 650.0000 mg | ORAL_TABLET | ORAL | Status: DC | PRN

## 2019-04-27 MED ORDER — SIMETHICONE 80 MG PO CHEW: 80.0000 mg | CHEWABLE_TABLET | ORAL | Status: DC | PRN

## 2019-04-27 MED ORDER — FENTANYL-BUPIVACAINE-NACL 0.5-0.125-0.9 MG/250ML-% EP SOLN
12.0000 mL/h | EPIDURAL | Status: DC | PRN
  Filled 2019-04-27: qty 250

## 2019-04-27 MED ORDER — OXYCODONE-ACETAMINOPHEN 5-325 MG PO TABS: 2.0000 | ORAL_TABLET | ORAL | Status: DC | PRN

## 2019-04-27 MED ORDER — ONDANSETRON HCL 4 MG/2ML IJ SOLN: 4.0000 mg | INTRAMUSCULAR | Status: DC | PRN

## 2019-04-27 MED ORDER — OXYTOCIN BOLUS FROM INFUSION
500.0000 mL | Freq: Once | INTRAVENOUS | Status: AC
  Administered 2019-04-27: 17:00:00 500 mL via INTRAVENOUS

## 2019-04-27 MED ORDER — LIDOCAINE HCL (PF) 1 % IJ SOLN: 30.0000 mL | INTRAMUSCULAR | Status: DC | PRN

## 2019-04-27 MED ORDER — SENNOSIDES-DOCUSATE SODIUM 8.6-50 MG PO TABS
2.0000 | ORAL_TABLET | ORAL | Status: DC
  Administered 2019-04-28 (×2): 2 via ORAL
  Filled 2019-04-27 (×2): qty 2

## 2019-04-27 MED ORDER — LACTATED RINGERS IV SOLN: INTRAVENOUS | Status: DC

## 2019-04-27 MED ORDER — DIPHENHYDRAMINE HCL 25 MG PO CAPS: 25.0000 mg | ORAL_CAPSULE | Freq: Four times a day (QID) | ORAL | Status: DC | PRN

## 2019-04-27 MED ORDER — ZOLPIDEM TARTRATE 5 MG PO TABS: 5.0000 mg | ORAL_TABLET | Freq: Every evening | ORAL | Status: DC | PRN

## 2019-04-27 MED ORDER — EPHEDRINE 5 MG/ML INJ: 10.0000 mg | INTRAVENOUS | Status: DC | PRN

## 2019-04-27 MED ORDER — WITCH HAZEL-GLYCERIN EX PADS: 1.0000 "application " | MEDICATED_PAD | CUTANEOUS | Status: DC | PRN

## 2019-04-27 MED ORDER — SODIUM CHLORIDE (PF) 0.9 % IJ SOLN
INTRAMUSCULAR | Status: DC | PRN
  Administered 2019-04-27: 14 mL/h via EPIDURAL

## 2019-04-27 MED ORDER — TERBUTALINE SULFATE 1 MG/ML IJ SOLN
INTRAMUSCULAR | Status: AC
  Filled 2019-04-27: qty 1

## 2019-04-27 MED ORDER — BENZOCAINE-MENTHOL 20-0.5 % EX AERO
1.0000 "application " | INHALATION_SPRAY | CUTANEOUS | Status: DC | PRN
  Administered 2019-04-29: 1 via TOPICAL
  Filled 2019-04-27: qty 56

## 2019-04-27 MED ORDER — TETANUS-DIPHTH-ACELL PERTUSSIS 5-2.5-18.5 LF-MCG/0.5 IM SUSP: 0.5000 mL | Freq: Once | INTRAMUSCULAR | Status: DC

## 2019-04-27 MED ORDER — SODIUM CHLORIDE 0.9 % IV SOLN
5.0000 10*6.[IU] | Freq: Once | INTRAVENOUS | Status: AC
  Administered 2019-04-27: 11:00:00 5 10*6.[IU] via INTRAVENOUS
  Filled 2019-04-27: qty 5

## 2019-04-27 NOTE — Anesthesia Preprocedure Evaluation (Signed)
Anesthesia Evaluation  Patient identified by MRN, date of birth, ID band Patient awake    Reviewed: Allergy & Precautions, NPO status , Patient's Chart, lab work & pertinent test results  Airway Mallampati: III  TM Distance: >3 FB Neck ROM: Full    Dental no notable dental hx.    Pulmonary neg pulmonary ROS, former smoker,    Pulmonary exam normal breath sounds clear to auscultation       Cardiovascular hypertension, negative cardio ROS Normal cardiovascular exam Rhythm:Regular Rate:Normal     Neuro/Psych negative neurological ROS  negative psych ROS   GI/Hepatic negative GI ROS, Neg liver ROS,   Endo/Other  Morbid obesity  Renal/GU negative Renal ROS  negative genitourinary   Musculoskeletal negative musculoskeletal ROS (+)   Abdominal   Peds negative pediatric ROS (+)  Hematology  (+) Blood dyscrasia, anemia ,   Anesthesia Other Findings   Reproductive/Obstetrics (+) Pregnancy                             Anesthesia Physical Anesthesia Plan  ASA: III  Anesthesia Plan: Epidural   Post-op Pain Management:    Induction:   PONV Risk Score and Plan: Treatment may vary due to age or medical condition  Airway Management Planned: Natural Airway  Additional Equipment:   Intra-op Plan:   Post-operative Plan:   Informed Consent: I have reviewed the patients History and Physical, chart, labs and discussed the procedure including the risks, benefits and alternatives for the proposed anesthesia with the patient or authorized representative who has indicated his/her understanding and acceptance.       Plan Discussed with: Anesthesiologist  Anesthesia Plan Comments: (Patient identified. Risks, benefits, options discussed with patient including but not limited to bleeding, infection, nerve damage, paralysis, failed block, incomplete pain control, headache, blood pressure changes,  nausea, vomiting, reactions to medication, itching, and post partum back pain. Confirmed with bedside nurse the patient's most recent platelet count. Confirmed with the patient that they are not taking any anticoagulation, have any bleeding history or any family history of bleeding disorders. Patient expressed understanding and wishes to proceed. All questions were answered. )        Anesthesia Quick Evaluation

## 2019-04-27 NOTE — MAU Note (Signed)
Theresa Horne is a 28 y.o. at [redacted]w[redacted]d here in MAU reporting: contractions since yesterday, they come every 3-5 minutes. No bleeding, no LOF, + FM. Was 2.5cm this past week at the office  Onset of complaint: yesterday  Pain score: 5/10  Vitals:   04/27/19 0855  BP: 128/77  Pulse: (!) 57  Resp: 16  Temp: 98.3 F (36.8 C)  SpO2: 99%     FHT: + FM  Lab orders placed from triage: none

## 2019-04-27 NOTE — H&P (Signed)
Theresa GentlesBrittany Horne is a 28 y.o. female, [id number], IUP at 38.5 weeks, presenting for spontaneous labor and endorses feeling cxt, irregular and mild, but has been feeling them all day. Pt endorse + Fm. Denies vaginal leakage. Denies vaginal bleeding. GBS+. Obesity BMI 46. H/O HTN (as a teenage with no meds and no reoccurrence). H/O PTD (First pregnancy 2012, [facility], 25 weeks. 17P last pregnancy, delivered at term, plan in current pregnancy from 16 weeks.). Rubella Non_immune. Baby Female, In-pt circ desired. 6.7lbs EFW vis US on 5/13.  Patient Active Problem List   Diagnosis Date Noted  . Anemia 10/04/2015  . Abdominal pressure 06/09/2015  . History of preterm delivery, currently pregnant -- delivered at 25 wks in March 2012 06/09/2015  . Hydradenitis 04/16/2012    Medications Prior to Admission  Medication Sig Dispense Refill Last Dose  . calcium carbonate (TUMS - DOSED IN MG ELEMENTAL CALCIUM) 500 MG chewable tablet Chew 1 tablet by mouth daily.   03/12/2019 at Unknown time  . cyclobenzaprine (FLEXERIL) 5 MG tablet Take 1 tablet (5 mg total) by mouth 3 (three) times daily as needed for muscle spasms. (Patient not taking: Reported on 11/15/2017) 10 tablet 0 Not Taking at Unknown time  . Prenatal Vit-Fe Fumarate-FA (PRENATAL MULTIVITAMIN) TABS tablet Take 1 tablet by mouth daily at 12 noon.   03/12/2019 at Unknown time  . promethazine (PHENERGAN) 25 MG tablet Take 25 mg by mouth every 6 (six) hours as needed for nausea or vomiting.   Past Week at Unknown time  . triamcinolone (KENALOG) 0.025 % ointment Apply 1 application topically 2 (two) times daily. (Patient not taking: Reported on 01/10/2017) 30 g 0 Not Taking at Unknown time    Past Medical History:  Diagnosis Date  . Hypertension   . Preterm labor      No current facility-administered medications on file prior to encounter.    Current Outpatient Medications on File Prior to Encounter  Medication Sig Dispense Refill  . calcium carbonate (TUMS  - DOSED IN MG ELEMENTAL CALCIUM) 500 MG chewable tablet Chew 1 tablet by mouth daily.    . cyclobenzaprine (FLEXERIL) 5 MG tablet Take 1 tablet (5 mg total) by mouth 3 (three) times daily as needed for muscle spasms. (Patient not taking: Reported on 11/15/2017) 10 tablet 0  . Prenatal Vit-Fe Fumarate-FA (PRENATAL MULTIVITAMIN) TABS tablet Take 1 tablet by mouth daily at 12 noon.    . promethazine (PHENERGAN) 25 MG tablet Take 25 mg by mouth every 6 (six) hours as needed for nausea or vomiting.    . triamcinolone (KENALOG) 0.025 % ointment Apply 1 application topically 2 (two) times daily. (Patient not taking: Reported on 01/10/2017) 30 g 0     No Known Allergies  History of present pregnancy: Pt Info/Preference:  Screening/Consents:  Labs:   EDD: Estimated Date of Delivery: 05/06/19  Establised: Patient's last menstrual period was 07/30/2018.  Anatomy Scan: Date: 20 wg Placenta Location: anterior Genetic Screen: Panoroma:declined AFP:  First Tri: Quad:  Office: [facility]             First PNV: 9.3 wg Blood Type O/Positive/-- (11/06 0000)  Language: english Last PNV: 38.1 wg Rhogam    Flu Vaccine:  UTD   Antibody Negative (11/06 0000)  TDaP vaccine UTD   GTT: Early: 5.6 hga1c Third Trimester: normal  Feeding Plan: breat BTL: no Rubella: Nonimmune (11/06 0000)  Contraception: ??? VBAC: No RPR: Nonreactive (11/06 0000)   Circumcision: In pt desired   HBsAg: Negative (  11/06 0000)  Pediatrician:  ???   HIV: Non-reactive (11/06 0000)   Prenatal Classes: No Additional [name]: 5/26 BPP 8/8; growth scan 5/13 see below. GBS:  (For PCN allergy, check sensitivities)       Chlamydia: neg    MFM Referral/Consult:  GC: neg  Support Person: parnter   PAP: 2019 normal  Pain Management: IV/epidural Neonatologist Referral:  Hgb Electrophoresis:  AA  Birth Plan: none   Hgb NOB: 11.4    28W: 10.3  Growth on 05/13:  OB History    Gravida  4   Para  2   Term  1   Preterm  1   AB  1   Living  2     SAB       TAB  1   Ectopic      Multiple  0   Live Births  2          Past Medical History:  Diagnosis Date  . Hypertension   . Preterm labor    Past Surgical History:  Procedure Laterality Date  . INDUCED ABORTION     Family History: family history includes Diabetes in her paternal uncle; Hypertension in her father; Renal Disease in her maternal grandmother. Social History:  reports that she quit smoking about 4 years ago. Her smoking use included cigarettes. She smoked 0.00 packs per day. She has never used smokeless tobacco. She reports previous alcohol use. She reports that she does not use drugs.   Prenatal Transfer Tool  Maternal Diabetes: No Genetic Screening: Declined Maternal Ultrasounds/Referrals: Normal Fetal Ultrasounds or other Referrals:  None Maternal Substance Abuse:  No Significant Maternal Medications:  None Significant Maternal Lab Results: Lab values include: Group B Strep positive  ROS:  Review of Systems  Constitutional: Negative.   HENT: Negative.   Eyes: Negative.   Respiratory: Negative.   Cardiovascular: Positive for leg swelling.  Gastrointestinal: Positive for abdominal pain.  Genitourinary: Negative.   Musculoskeletal: Negative.   Skin: Negative.   Neurological: Negative.   Endo/Heme/Allergies: Negative.   Psychiatric/Behavioral: Negative.      Physical Exam: BP 128/77 (BP Location: Right Arm)   Pulse (!) 57   Temp 98.3 F (36.8 C) (Oral)   Resp 16   Ht 5\' 4"  (1.626 m)   Wt 121.8 kg   LMP 07/30/2018   SpO2 99%   BMI 46.11 kg/m   Physical Exam  Constitutional: She is oriented to person, place, and time and well-developed, well-nourished, and in no distress.  HENT:  Head: Normocephalic and atraumatic.  Eyes: Pupils are equal, round, and reactive to light. Conjunctivae are normal.  Neck: Normal range of motion. Neck supple.  Cardiovascular: Normal rate and regular rhythm.  Pulmonary/Chest: Effort normal and breath sounds  normal.  Abdominal: Soft. Bowel sounds are normal.  Genitourinary:    Genitourinary Comments: Uterus soft non-tender, gravida equal to dates, pelvis adequate for vaginal delivery.    Musculoskeletal: Normal range of motion.        General: Edema present.     Comments: +1 tibial edema. In bilaterally extremities.   Neurological: She is alert and oriented to person, place, and time. She has normal reflexes. Gait normal.  No clonus  Skin: Skin is warm and dry.  Psychiatric: Affect normal.  Nursing note and vitals reviewed.    NST: FHR baseline 125 bpm, Variability: moderate, Accelerations:present, Decelerations:  Absent= Cat 1/Reactive UC:   irregular, every 2-7 minutes, lasting 60-80 seconds, mild to palpate, maternal  perception mild SVE:   Dilation: 4.5 Effacement (%): 80 Station: -2 Exam by:: Marvel Plan RN , vertex verified by fetal sutures.  Leopold's: Position vertex pounds, EFW 7/7.5lbs. via leopold's.   Labs: No results found for this or any previous visit (from the past 24 hour(s)).  Imaging:  No results found.  MAU Course: No orders of the defined types were placed in this encounter.  No orders of the defined types were placed in this encounter.   Assessment/Plan: Theresa Horne is a 28 y.o. female, [phone], IUP at 38.5 weeks, presenting for spontaneous labor and endorses feeling cxt, irregular and mild, but has been feeling them all day. Pt endorse + Fm. Denies vaginal leakage. Denies vaginal bleeding. GBS+. Obesity BMI 46. H/O HTN (as a teenage with no meds and no reoccurrence). H/O PTD (First pregnancy 2012, [facility], 25 weeks. 17P last pregnancy, delivered at term, plan in current pregnancy from 16 weeks.). Rubella Non_immune. Baby Female, In-pt circ desired. 6.7lbs EFW vis [name] on 5/13.   FWB: Cat 1 Fetal Tracing.   Plan: Admit to Birthing Suite per consult with Dr Mora Appl Routine [facility] orders Pain med/epidural prn H/O HTN if elevated BP >140/90 will draw baseline PrE  labs.  PCN G for GBS prophylaxis  Anticipate labor progression   Dale Waverly NP-C, CNM, MSN 04/27/2019, 9:46 AM

## 2019-04-27 NOTE — Anesthesia Procedure Notes (Signed)
Epidural Patient location during procedure: OB Start time: 04/27/2019 4:20 PM End time: 04/27/2019 4:35 PM  Staffing Anesthesiologist: Elmer Picker, MD Performed: anesthesiologist   Preanesthetic Checklist Completed: patient identified, pre-op evaluation, timeout performed, IV checked, risks and benefits discussed and monitors and equipment checked  Epidural Patient position: sitting Prep: site prepped and draped and DuraPrep Patient monitoring: continuous pulse ox, blood pressure, heart rate and cardiac monitor Approach: midline Location: L3-L4 Injection technique: LOR air  Needle:  Needle type: Tuohy  Needle gauge: 17 G Needle length: 9 cm Needle insertion depth: 7 cm Catheter type: closed end flexible Catheter size: 19 Gauge Catheter at skin depth: 13 cm Test dose: negative  Assessment Sensory level: T8 Events: blood not aspirated, injection not painful, no injection resistance, negative IV test and no paresthesia  Additional Notes Patient identified. Risks/Benefits/Options discussed with patient including but not limited to bleeding, infection, nerve damage, paralysis, failed block, incomplete pain control, headache, blood pressure changes, nausea, vomiting, reactions to medication both or allergic, itching and postpartum back pain. Confirmed with bedside nurse the patient's most recent platelet count. Confirmed with patient that they are not currently taking any anticoagulation, have any bleeding history or any family history of bleeding disorders. Patient expressed understanding and wished to proceed. All questions were answered. Sterile technique was used throughout the entire procedure. Please see nursing notes for vital signs. Test dose was given through epidural catheter and negative prior to continuing to dose epidural or start infusion. Warning signs of high block given to the patient including shortness of breath, tingling/numbness in hands, complete motor block,  or any concerning symptoms with instructions to call for help. Patient was given instructions on fall risk and not to get out of bed. All questions and concerns addressed with instructions to call with any issues or inadequate analgesia.  Reason for block:procedure for pain

## 2019-04-27 NOTE — Progress Notes (Signed)
Labor Progress Note  Theresa Horne is a 28 y.o. female, [id number], IUP at 38.5 weeks, presenting for spontaneous labor and endorses feeling cxt, irregular and mild, but has been feeling them all day. Pt endorse + Fm. Denies vaginal leakage. Denies vaginal bleeding. GBS+. Obesity BMI 46. H/O HTN (as a teenage with no meds and no reoccurrence). H/O PTD (First pregnancy 2012, [facility], 25 weeks. 17P last pregnancy, delivered at term, plan in current pregnancy from 16 weeks.). Rubella Non_immune. Baby Female, In-pt circ desired. 6.7lbs EFW vis [name] on 5/13.  Subjective: Pt endorses feeling cxt, every 5 mins, feeling stronger. Pt endorses wanting to hurting up and have her baby. Pt asking for induction. Reviewed AROM with pt, R/B/A, pt even given the option to be discharged home due to no cervical change but has been adequately tx with penicillin, pt opted for AROM and verbilized understanding of risk factor and verbalizing consent.   Patient Active Problem List   Diagnosis Date Noted  . Normal labor and delivery 04/27/2019  . Anemia 10/04/2015  . Abdominal pressure 06/09/2015  . History of preterm delivery, currently pregnant -- delivered at 25 wks in March 2012 06/09/2015  . Hydradenitis 04/16/2012   Objective: BP (!) 149/70   Pulse 100   Temp 98 F (36.7 C) (Oral)   Resp 18   Ht 5\' 4"  (1.626 m)   Wt 121.8 kg   LMP 07/30/2018   SpO2 99%   BMI 46.11 kg/m  No intake/output data recorded. No intake/output data recorded. NST: FHR baseline 130 bpm, Variability: moderate, Accelerations:present, Decelerations:  Absent= Cat 1/Reactive CTX:  irregular, every 3-6 minutes Uterus gravid, soft non tender, mild/ moderate to palpate with contractions.  SVE:  Dilation: 4.5 Effacement (%): 80 Station: -2 Exam by:: J Montanna   AROM, clear, tolerated well, light amount.   Assessment:  Theresa Horne is a 28 y.o. female, [phone], IUP at 38.5 weeks, presenting for spontaneous labor and endorses feeling  cxt, irregular and mild, but has been feeling them all day. Pt endorse + Fm. Denies vaginal leakage. Denies vaginal bleeding. GBS+. Obesity BMI 46. H/O HTN (as a teenage with no meds and no reoccurrence). H/O PTD (First pregnancy 2012, [facility], 25 weeks. 17P last pregnancy, delivered at term, plan in current pregnancy from 16 weeks.). Rubella Non_immune. Baby Female, In-pt circ desired. 6.7lbs EFW vis [name] on 5/13. AROM tolerated well. Pt feeling more cxt and requested IV pain meds. Slowely progressing in early labor.  Patient Active Problem List   Diagnosis Date Noted  . Normal labor and delivery 04/27/2019  . Anemia 10/04/2015  . Abdominal pressure 06/09/2015  . History of preterm delivery, currently pregnant -- delivered at 25 wks in March 2012 06/09/2015  . Hydradenitis 04/16/2012   NICHD: Category 1  Membranes:  AROM on 5/30 @ 1510x 0hrs, no s/s of infection  Pain management:               IV pain management: x1 fentanyl @ 1530             Epidural placement: PRN  GBS positive  Abx: x2 Penicillin   Plan: Continue labor plan Continuous/intermittent monitoring Rest/Ambulate/Frequent position changes to facilitate fetal rotation and descent. Will reassess with cervical exam at 1900 or earlier if necessary Continue pitocin per protocol Anticipate labor progression and vaginal delivery.   Md Pinn aware of plan and verbalized agreement.   Dale [place], NP-C, CNM, MSN 04/27/2019. 3:44 PM

## 2019-04-27 NOTE — Lactation Note (Signed)
This note was copied from a baby's chart. Lactation Consultation Note  Patient Name: Theresa Horne [id number] Date: 04/27/2019 Reason for consult: Initial assessment;Early term 55-38.6wks  3 hours old early term female who is being partially BF and formula fed by his mother, she's a P3 and experienced BF. She was able to BF her first one during 1 month and her second one for 3 months, and the BF difficulty she had to face was infant separation due to prematurity, her first baby was born at 25 weeks and was in the NICU. Mom participated in the [facility] program at the [facility] and she's already familiar with hand expression. She has a DEBP at home.  Offered assistance with latch but mom politely declined, baby wasn't due for a feeding, she had already given baby Gerber Gentle. Mom aware of formula supplementation guidelines according to baby's age in hours and the size of baby's stomach. Reassured to mom that babies only need drops of colostrum the first 24 hours, but that they need to be given constantly at least 8-12 times/24 hours or sooner if feeding cues are present. Mom requested to be set up with a DEBP at the hospital and she also wanted a hand pump to take home. Instructions, cleaning and storage were reviewed, as well as milk storage guidelines. Reviewed normal newborn behavior, feeding cues and cluster feeding.  Feeding plan:  1. Encouraged mom to feed baby STS 8-12 times/24 hours or sooner if feeding cues are present 2. Mom will pump after feedings every 3 hours and will offer baby any amount of EBM she may get prior offering formula 3. She'll continue supplementing baby with Rush Barer Gentle according to formula supplementation guidelines per baby's age in hours  BF brochure, BF resources and feeding diary were reviewed. Mom reported all questions and concerns were answered, she's aware of LC OP services and will call PRN.  Maternal Data Formula Feeding for Exclusion: No Has patient been taught  Hand Expression?: Yes Does the patient have breastfeeding experience prior to this delivery?: Yes  Feeding Feeding Type: Bottle Fed - Formula  LATCH Score Latch: Grasps breast easily, tongue down, lips flanged, rhythmical sucking.  Audible Swallowing: Spontaneous and intermittent  Type of Nipple: Everted at rest and after stimulation  Comfort (Breast/Nipple): Soft / non-tender  Hold (Positioning): No assistance needed to correctly position infant at breast.  LATCH Score: 10  Interventions Interventions: Breast feeding basics reviewed;Hand pump;DEBP  Lactation Tools Discussed/Used Tools: Pump Breast pump type: Double-Electric Breast Pump;Manual WIC Program: Yes Pump Review: Setup, frequency, and cleaning;Milk Storage Initiated by:: MPeck Date initiated:: 04/27/19   Consult Status Consult Status: PRN Date: 04/28/19 Follow-up type: In-patient    Theresa Horne 04/27/2019, 8:45 PM

## 2019-04-28 LAB — CBC
HCT: 28.1 % — ABNORMAL LOW (ref 36.0–46.0)
Hemoglobin: 9 g/dL — ABNORMAL LOW (ref 12.0–15.0)
MCH: 26.7 pg (ref 26.0–34.0)
MCHC: 32 g/dL (ref 30.0–36.0)
MCV: 83.4 fL (ref 80.0–100.0)
Platelets: 138 10*3/uL — ABNORMAL LOW (ref 150–400)
RBC: 3.37 MIL/uL — ABNORMAL LOW (ref 3.87–5.11)
RDW: 14.7 % (ref 11.5–15.5)
WBC: 11.1 10*3/uL — ABNORMAL HIGH (ref 4.0–10.5)
nRBC: 0 % (ref 0.0–0.2)

## 2019-04-28 LAB — COMPREHENSIVE METABOLIC PANEL
ALT: 26 U/L (ref 0–44)
AST: 33 U/L (ref 15–41)
Albumin: 3 g/dL — ABNORMAL LOW (ref 3.5–5.0)
Alkaline Phosphatase: 91 U/L (ref 38–126)
Anion gap: 9 (ref 5–15)
BUN: 7 mg/dL (ref 6–20)
CO2: 20 mmol/L — ABNORMAL LOW (ref 22–32)
Calcium: 9.6 mg/dL (ref 8.9–10.3)
Chloride: 107 mmol/L (ref 98–111)
Creatinine, Ser: 0.92 mg/dL (ref 0.44–1.00)
GFR calc Af Amer: >60 mL/min (ref >60)
GFR calc non Af Amer: >60 mL/min (ref >60)
Glucose, Bld: 88 mg/dL (ref 70–99)
Potassium: 4 mmol/L (ref 3.5–5.1)
Sodium: 136 mmol/L (ref 135–145)
Total Bilirubin: 0.5 mg/dL (ref 0.3–1.2)
Total Protein: 6.5 g/dL (ref 6.5–8.1)

## 2019-04-28 LAB — PROTEIN / CREATININE RATIO, URINE
Creatinine, Urine: 60.91 mg/dL
Protein Creatinine Ratio: 0.13 mg/mg{Cre} (ref 0.00–0.15)
Total Protein, Urine: 8 mg/dL

## 2019-04-28 MED ORDER — OXYCODONE HCL 5 MG PO TABS
5.0000 mg | ORAL_TABLET | Freq: Four times a day (QID) | ORAL | Status: DC | PRN
  Administered 2019-04-28 – 2019-04-29 (×5): 5 mg via ORAL
  Filled 2019-04-28 (×5): qty 1

## 2019-04-28 MED ORDER — IBUPROFEN 600 MG PO TABS: 600.0000 mg | ORAL_TABLET | Freq: Four times a day (QID) | ORAL | 0 refills | Status: DC

## 2019-04-28 NOTE — Lactation Note (Addendum)
This note was copied from a baby's chart. Lactation Consultation Note  Patient Name: Boy Kersha Hainer [id number] Date: 04/28/2019   P3, Baby 26 hours old.  Ex BF.  Baby being circumcised.  Mother states her nipples are tender.  No cracks or abrasions. Mother states baby is not opening wide.  Encouraged mother to unwrap baby for feeding.  Encouraged mother to lightly give do chin tug after baby latches to wide gape and bring him on deeper. Mother is doubting her supply.  Discussed supply and demand and how breastmilk comes to volume. Mother has DEBP and has been doing some pumping. Provided mother with coconut oil for soreness and suggest she call if she needs further assistance.      Maternal Data    Feeding Feeding Type: Breast Fed  LATCH Score                   Interventions    Lactation Tools Discussed/Used     Consult Status      Hardie Pulley 04/28/2019, 7:38 PM

## 2019-04-28 NOTE — Discharge Summary (Signed)
OB Discharge Summary     Patient Name: Theresa Horne DOB: [birth year] MRN: [id number]  Date of admission: 04/27/2019 Delivering MD: Dale Millersport   Date of discharge: 04/28/2019  Admitting diagnosis: CTX 3-5 mins Intrauterine pregnancy: [redacted]w[redacted]d     Secondary diagnosis:  Active Problems:   Normal labor and delivery  Additional problems: None     Discharge diagnosis: Term Pregnancy Delivered                                                                                                Post partum procedures:Rubella  Augmentation: N/A  Complications: None  Hospital course:  Onset of Labor With Vaginal Delivery     28 y.o. yo [id number] at [redacted]w[redacted]d was admitted in Active Labor on 04/27/2019. Patient had an uncomplicated labor course as follows:  Membrane Rupture Time/Date: 3:10 PM ,04/27/2019   Intrapartum Procedures: Episiotomy: None [1]                                         Lacerations:  None [1]  Patient had a delivery of a Viable infant. 04/27/2019  Information for the patient's newborn:  Misheel, Jokinen [[id number]]  Delivery Method: Vaginal, Spontaneous(Filed from Delivery Summary)    Pateint had an uncomplicated postpartum course.  She is ambulating, tolerating a regular diet, passing flatus, and urinating well. Patient is discharged home in stable condition on 04/28/19.   Physical exam  Vitals:   04/27/19 2030 04/28/19 0030 04/28/19 0430 04/28/19 0840  BP: 128/85 135/79 131/73 140/78  Pulse: 99 76 (!) 103 75  Resp: 20 18 16 17   Temp: 98.5 F (36.9 C) 98 F (36.7 C) 98.1 F (36.7 C) 98 F (36.7 C)  TempSrc: Oral Oral Oral Oral  SpO2: 100%   100%  Weight:      Height:       General: alert, cooperative and no distress Lochia: appropriate Uterine Fundus: firm Incision: N/A DVT Evaluation: No evidence of DVT seen on physical exam. Labs: Lab Results  Component Value Date   WBC 11.1 (H) 04/28/2019   HGB 9.0 (L) 04/28/2019   HCT 28.1 (L) 04/28/2019   MCV 83.4 04/28/2019   PLT 138 (L) 04/28/2019   CMP Latest Ref Rng & Units 03/22/2018  Glucose 65 - 99 mg/dL 413(K)  BUN 6 - 20 mg/dL 9  Creatinine 4.40 - 1.02 mg/dL 7.25  Sodium 366 - 440 mmol/L 138  Potassium 3.5 - 5.1 mmol/L 4.0  Chloride 101 - 111 mmol/L 105  CO2 22 - 32 mmol/L 25  Calcium 8.9 - 10.3 mg/dL 9.4  Total Protein 6.5 - 8.1 g/dL -  Total Bilirubin 0.3 - 1.2 mg/dL -  Alkaline Phos 38 - 347 U/L -  AST 15 - 41 U/L -  ALT 14 - 54 U/L -    Discharge instruction: per After Visit Summary and "Baby and Me Booklet".  After visit meds:  Allergies as of 04/28/2019   No Known Allergies     Medication  List    STOP taking these medications   calcium carbonate 500 MG chewable tablet Commonly known as:  TUMS - dosed in mg elemental calcium   cyclobenzaprine 5 MG tablet Commonly known as:  FLEXERIL   promethazine 25 MG tablet Commonly known as:  PHENERGAN   triamcinolone 0.025 % ointment Commonly known as:  KENALOG     TAKE these medications   ibuprofen 600 MG tablet Commonly known as:  ADVIL Take 1 tablet (600 mg total) by mouth every 6 (six) hours.   prenatal multivitamin Tabs tablet Take 1 tablet by mouth daily at 12 noon.       Diet: routine diet  Activity: Advance as tolerated. Pelvic rest for 6 weeks.   Outpatient follow up:6 weeks Follow up Appt: Future Appointments  Date Time Provider Department Center  05/02/2019  8:20 AM [facility] 1 [facility] None   Follow up Visit:No follow-ups on file.  Postpartum contraception: Vasectomy  Newborn Data: Live born female  Birth Weight: 7 lb 11.1 oz (3490 g) APGAR: 9, 10  Newborn Delivery   Birth date/time:  04/27/2019 17:14:00 Delivery type:  Vaginal, Spontaneous     Baby Feeding: Breast Disposition:home with mother   04/28/2019 Kenney HousemanNancy Jean Prothero, CNM

## 2019-04-28 NOTE — Progress Notes (Signed)
RN gave pt cup to void in a few hours ago. RN went to collect urine, and pt stated she forgot and voided without collecting. RN asked pt to call when she voids next. Pt agreed.

## 2019-04-28 NOTE — Progress Notes (Signed)
Post Partum Day 1 Subjective: no complaints, up ad lib, voiding and tolerating PO.  Denies HA, visual changes or abdominal pain and would like to go home.  Objective: Blood pressure 140/78, pulse 75, temperature 98 F (36.7 C), temperature source Oral, resp. rate 17, height 5\' 4"  (1.626 m), weight 121.8 kg, last menstrual period 07/30/2018, SpO2 100 %, unknown if currently breastfeeding.  Physical Exam:  General: alert and no distress Lochia: appropriate Uterine Fundus: firm Incision: n/a DVT Evaluation: No evidence of DVT seen on physical exam.  Recent Labs    04/27/19 1000 04/28/19 0431  HGB 10.7* 9.0*  HCT 33.2* 28.1*    Assessment/Plan: Circumcision prior to discharge  Will check labs, pt has a h/o CHTN but no meds BP mildly elevated and pt asymptomatic Hesitant about discharge today d/t evening delivery yesterday and would like to eval BPs more today Baby unlikely to be discharged today as well Reeval BPs this afternoon    LOS: 1 day   Purcell Nails 04/28/2019, 11:20 AM

## 2019-04-28 NOTE — Anesthesia Postprocedure Evaluation (Signed)
Anesthesia Post Note  Patient: Theresa Horne  Procedure(s) Performed: AN AD HOC LABOR EPIDURAL     Patient location during evaluation: Mother Baby Anesthesia Type: Epidural Level of consciousness: awake and alert and oriented Pain management: satisfactory to patient Vital Signs Assessment: post-procedure vital signs reviewed and stable Respiratory status: respiratory function stable Cardiovascular status: stable Postop Assessment: no headache, no backache, epidural receding, patient able to bend at knees, no signs of nausea or vomiting and adequate PO intake Anesthetic complications: no    Last Vitals:  Vitals:   04/28/19 0030 04/28/19 0430  BP: 135/79 131/73  Pulse: 76 (!) 103  Resp: 18 16  Temp: 36.7 C 36.7 C  SpO2:      Last Pain:  Vitals:   04/28/19 0714  TempSrc:   PainSc: 8    Pain Goal:                   Theresa Horne

## 2019-04-29 LAB — RPR: RPR Ser Ql: NONREACTIVE

## 2019-04-29 MED ORDER — MEASLES, MUMPS & RUBELLA VAC IJ SOLR
0.5000 mL | Freq: Once | INTRAMUSCULAR | Status: AC
  Administered 2019-04-29: 0.5 mL via SUBCUTANEOUS
  Filled 2019-04-29: qty 0.5

## 2019-04-29 MED ORDER — FERROUS SULFATE 325 (65 FE) MG PO TABS: 325.0000 mg | ORAL_TABLET | Freq: Every day | ORAL | 3 refills | Status: DC

## 2019-04-29 NOTE — Lactation Note (Signed)
This note was copied from a baby's chart. Lactation Consultation Note  Patient Name: Theresa Horne [id number] Date: 04/29/2019   Infant is 79 hours old. Mom called out for latch assist. Mom was assisted in putting infant to the R breast, but infant was not vigorous/interested. Mom's milk has not come to volume, yet. My impression is that infant will be interested in latching once Mom's breasts are full. I recommended that Mom pump whenever infant receives formula. Mom prefers to use a hand pump instead of an electric pump.   For the crack on Mom's L nipple, Mom is putting coconut oil on it. I gave her shells & showed her how to wear them so that the coconut oil won't rub off of nipple/protect nipple from further friction/abrasions.    Lurline Hare [facility] 04/29/2019, 10:13 AM

## 2019-04-29 NOTE — Discharge Summary (Signed)
OB Discharge Summary     Patient Name: Theresa Horne DOB: February 19, 1991 MRN: [id number]  Date of admission: 04/27/2019 Delivering MD: Dale North Logan   Date of discharge: 04/29/2019  Admitting diagnosis: CTX 3-5 mins Intrauterine pregnancy: [redacted]w[redacted]d     Secondary diagnosis:  Active Problems:   Normal labor and delivery  Additional problems: None     Discharge diagnosis: Term Pregnancy Delivered                                                                                                Post partum procedures:Rubella  Augmentation: N/A  Complications: None  Hospital course:  Onset of Labor With Vaginal Delivery     28 y.o. yo [id number] at [redacted]w[redacted]d was admitted in Active Labor on 04/27/2019. Patient had an uncomplicated labor course as follows:  Membrane Rupture Time/Date: 3:10 PM ,04/27/2019   Intrapartum Procedures: Episiotomy: None [1]                                         Lacerations:  None [1]  Patient had a delivery of a Viable infant. 04/27/2019  Information for the patient's newborn:  Prudie, Nestor [[id number]]  Delivery Method: Vaginal, Spontaneous(Filed from Delivery Summary)    Pateint had an uncomplicated postpartum course.  She is ambulating, tolerating a regular diet, passing flatus, and urinating well. Patient is discharged home in stable condition on 04/29/19.   Physical exam  Vitals:   04/28/19 0840 04/28/19 1408 04/28/19 2130 04/29/19 0500  BP: 140/78 (!) 146/76 124/70 126/69  Pulse: 75 (!) 54 (!) 50 (!) 57  Resp: 17 18 16 18   Temp: 98 F (36.7 C) 97.9 F (36.6 C) 98.7 F (37.1 C) 98.4 F (36.9 C)  TempSrc: Oral  Oral Oral  SpO2: 100% 99% 100%   Weight:      Height:       General: alert, cooperative and no distress Lochia: appropriate Uterine Fundus: firm Incision: N/A DVT Evaluation: No evidence of DVT seen on physical exam. Labs: Lab Results  Component Value Date   WBC 11.1 (H) 04/28/2019   HGB 9.0 (L) 04/28/2019   HCT 28.1 (L)  04/28/2019   MCV 83.4 04/28/2019   PLT 138 (L) 04/28/2019   CMP Latest Ref Rng & Units 04/28/2019  Glucose 70 - 99 mg/dL 88  BUN 6 - 20 mg/dL 7  Creatinine 0.34 - 9.17 mg/dL 9.15  Sodium 056 - 979 mmol/L 136  Potassium 3.5 - 5.1 mmol/L 4.0  Chloride 98 - 111 mmol/L 107  CO2 22 - 32 mmol/L 20(L)  Calcium 8.9 - 10.3 mg/dL 9.6  Total Protein 6.5 - 8.1 g/dL 6.5  Total Bilirubin 0.3 - 1.2 mg/dL 0.5  Alkaline Phos 38 - 126 U/L 91  AST 15 - 41 U/L 33  ALT 0 - 44 U/L 26    Discharge instruction: per After Visit Summary and "Baby and Me Booklet".  After visit meds:  Allergies as of 04/29/2019   No Known Allergies  Medication List    STOP taking these medications   calcium carbonate 500 MG chewable tablet Commonly known as:  TUMS - dosed in mg elemental calcium   cyclobenzaprine 5 MG tablet Commonly known as:  FLEXERIL   promethazine 25 MG tablet Commonly known as:  PHENERGAN   triamcinolone 0.025 % ointment Commonly known as:  KENALOG     TAKE these medications   ibuprofen 600 MG tablet Commonly known as:  ADVIL Take 1 tablet (600 mg total) by mouth every 6 (six) hours.   prenatal multivitamin Tabs tablet Take 1 tablet by mouth daily at 12 noon.       Diet: routine diet  Activity: Advance as tolerated. Pelvic rest for 6 weeks.   Outpatient follow up:6 weeks Follow up Appt: Future Appointments  Date Time Provider Department Center  05/02/2019  8:20 AM [facility] 1 [facility] None   Follow up Visit:No follow-ups on file.  Postpartum contraception: Vasectomy  Newborn Data: Live born female  Birth Weight: 7 lb 11.1 oz (3490 g) APGAR: 9, 10  Newborn Delivery   Birth date/time:  04/27/2019 17:14:00 Delivery type:  Vaginal, Spontaneous     Baby Feeding: Breast Disposition:home with mother   04/29/2019 Kenney HousemanNancy Jean Prothero, CNM

## 2019-04-29 NOTE — Lactation Note (Signed)
This note was copied from a baby's chart. Lactation Consultation Note  Patient Name: Boy Ryley Latvala JMEQA'S Date: 04/29/2019   Infant is 34 hrs old. Mom would really like to feed more at the breast, but it hurts & she feels that infant isn't getting enough. Mom's L nipple especially hurts with nursing & there is a crack on the tip of that nipple. Her R nipple appears atraumatic. Mom agrees that there is nipple shape distortion when infant releases latch. Infant just drank 42 mL of formula with a bottle & has fallen asleep. Mom will call me when infant is ready to feed again so I can assist/observe feeding.  Specifics of an asymmetric latch were shown via [employer].   Mom reports that with her previous babies, her milk came to volume around the 4th day & she had an abundant supply. Mom feels that her breasts feel heavier today.    Lurline Hare [facility] 04/29/2019, 8:27 AM

## 2019-04-30 DIAGNOSIS — M7989 Other specified soft tissue disorders: Secondary | ICD-10-CM | POA: Diagnosis not present

## 2019-04-30 DIAGNOSIS — R8281 Pyuria: Secondary | ICD-10-CM | POA: Diagnosis not present

## 2019-05-02 ENCOUNTER — Other Ambulatory Visit (HOSPITAL_COMMUNITY)
Admission: RE | Admit: 2019-05-02 | Discharge: 2019-05-02 | Disposition: A | Discharge status: Home / Self Care | Payer: 59 | Source: Ambulatory Visit | Attending: Obstetrics & Gynecology | Admitting: Obstetrics & Gynecology

## 2019-05-06 ENCOUNTER — Inpatient Hospital Stay (HOSPITAL_COMMUNITY): Payer: 59

## 2019-06-19 ENCOUNTER — Other Ambulatory Visit: Payer: Self-pay

## 2019-06-19 DIAGNOSIS — R6889 Other general symptoms and signs: Secondary | ICD-10-CM | POA: Diagnosis not present

## 2019-06-19 DIAGNOSIS — Z20822 Contact with and (suspected) exposure to covid-19: Secondary | ICD-10-CM

## 2019-06-23 LAB — NOVEL CORONAVIRUS, NAA: SARS-CoV-2, NAA: NOT DETECTED

## 2019-07-12 DIAGNOSIS — N926 Irregular menstruation, unspecified: Secondary | ICD-10-CM | POA: Diagnosis not present

## 2019-07-12 DIAGNOSIS — N898 Other specified noninflammatory disorders of vagina: Secondary | ICD-10-CM | POA: Diagnosis not present

## 2019-09-07 DIAGNOSIS — Z23 Encounter for immunization: Secondary | ICD-10-CM | POA: Diagnosis not present

## 2019-10-28 ENCOUNTER — Other Ambulatory Visit: Payer: Self-pay

## 2019-10-28 DIAGNOSIS — Z20822 Contact with and (suspected) exposure to covid-19: Secondary | ICD-10-CM

## 2019-10-30 LAB — NOVEL CORONAVIRUS, NAA: SARS-CoV-2, NAA: NOT DETECTED

## 2019-11-04 ENCOUNTER — Other Ambulatory Visit: Payer: Self-pay

## 2019-11-04 DIAGNOSIS — Z20822 Contact with and (suspected) exposure to covid-19: Secondary | ICD-10-CM

## 2019-11-05 LAB — NOVEL CORONAVIRUS, NAA: SARS-CoV-2, NAA: NOT DETECTED

## 2019-11-15 ENCOUNTER — Ambulatory Visit: Payer: 59 | Attending: Internal Medicine

## 2019-11-15 DIAGNOSIS — Z20822 Contact with and (suspected) exposure to covid-19: Secondary | ICD-10-CM

## 2019-11-15 DIAGNOSIS — Z20828 Contact with and (suspected) exposure to other viral communicable diseases: Secondary | ICD-10-CM | POA: Diagnosis not present

## 2019-11-16 LAB — NOVEL CORONAVIRUS, NAA: SARS-CoV-2, NAA: DETECTED — AB

## 2020-02-14 DIAGNOSIS — R519 Headache, unspecified: Secondary | ICD-10-CM | POA: Diagnosis not present

## 2020-02-14 DIAGNOSIS — Z20828 Contact with and (suspected) exposure to other viral communicable diseases: Secondary | ICD-10-CM | POA: Diagnosis not present

## 2020-02-14 DIAGNOSIS — Z03818 Encounter for observation for suspected exposure to other biological agents ruled out: Secondary | ICD-10-CM | POA: Diagnosis not present

## 2020-02-14 DIAGNOSIS — J029 Acute pharyngitis, unspecified: Secondary | ICD-10-CM | POA: Diagnosis not present

## 2020-07-27 ENCOUNTER — Ambulatory Visit
Admission: EM | Admit: 2020-07-27 | Discharge: 2020-07-27 | Disposition: A | Discharge status: Home / Self Care | Payer: Medicaid Other | Attending: Emergency Medicine | Admitting: Emergency Medicine

## 2020-07-27 ENCOUNTER — Other Ambulatory Visit: Payer: Self-pay

## 2020-07-27 ENCOUNTER — Encounter: Payer: Self-pay | Admitting: *Deleted

## 2020-07-27 DIAGNOSIS — Z20822 Contact with and (suspected) exposure to covid-19: Secondary | ICD-10-CM | POA: Insufficient documentation

## 2020-07-27 DIAGNOSIS — J029 Acute pharyngitis, unspecified: Secondary | ICD-10-CM | POA: Insufficient documentation

## 2020-07-27 LAB — POCT RAPID STREP A (OFFICE): Rapid Strep A Screen: NEGATIVE

## 2020-07-27 MED ORDER — IBUPROFEN 800 MG PO TABS: 800.0000 mg | ORAL_TABLET | Freq: Three times a day (TID) | ORAL | 0 refills | Status: DC

## 2020-07-27 NOTE — Discharge Instructions (Signed)

## 2020-07-27 NOTE — ED Provider Notes (Signed)
[name]    CSN: [id number] Arrival date & time: 07/27/20  0907      History   Chief Complaint Chief Complaint  Patient presents with   Sore Throat    HPI Theresa Horne is a 29 y.o. female history of hypertension presenting today for evaluation of sore throat.  Patient has had a sore throat for the past 3 days.  Here with daughter who is also had some sore throat and cough.  Denies any known Covid exposures.  Denies associated rhinorrhea or cough.  Denies fevers chills or body aches.  HPI  Past Medical History:  Diagnosis Date   Hypertension    Preterm labor     Patient Active Problem List   Diagnosis Date Noted   Normal labor and delivery 04/27/2019   Anemia 10/04/2015   Abdominal pressure 06/09/2015   History of preterm delivery, currently pregnant -- delivered at 25 wks in March 2012 06/09/2015   Hydradenitis 04/16/2012    Past Surgical History:  Procedure Laterality Date   INDUCED ABORTION      OB History    Gravida  4   Para  3   Term  2   Preterm  1   AB  1   Living  3     SAB      TAB  1   Ectopic      Multiple  0   Live Births  3            Home Medications    Prior to Admission medications   Medication Sig Start Date End Date Taking? Authorizing Provider  ferrous sulfate 325 (65 FE) MG tablet Take 1 tablet (325 mg total) by mouth daily. 04/29/19 04/28/20  Prothero, Henderson Newcomer, CNM  ibuprofen (ADVIL) 800 MG tablet Take 1 tablet (800 mg total) by mouth 3 (three) times daily. 07/27/20   Deshanta Lady C, PA-C  Prenatal Vit-Fe Fumarate-FA (PRENATAL MULTIVITAMIN) TABS tablet Take 1 tablet by mouth daily at 12 noon.    [provider]    Family History Family History  Problem Relation Age of Onset   Hypertension Father    Diabetes Paternal Uncle    Renal Disease Maternal Grandmother     Social History Social History   Tobacco Use   Smoking status: Former Smoker    Packs/day: 0.00     Types: Cigarettes    Quit date: 01/26/2015    Years since quitting: 5.5   Smokeless tobacco: Never Used  Vaping Use   Vaping Use: Never used  Substance Use Topics   Alcohol use: Not Currently    Comment: social use before preg   Drug use: No     Allergies   Patient has no known allergies.   Review of Systems Review of Systems  Constitutional: Negative for activity change, appetite change, chills, fatigue and fever.  HENT: Positive for sore throat. Negative for congestion, ear pain, rhinorrhea, sinus pressure and trouble swallowing.   Eyes: Negative for discharge and redness.  Respiratory: Negative for cough, chest tightness and shortness of breath.   Cardiovascular: Negative for chest pain.  Gastrointestinal: Negative for abdominal pain, diarrhea, nausea and vomiting.  Musculoskeletal: Negative for myalgias.  Skin: Negative for rash.  Neurological: Negative for dizziness, light-headedness and headaches.     Physical Exam Triage Vital Signs ED Triage Vitals  Enc Vitals Group     BP 07/27/20 1029 (!) 143/80     Pulse Rate 07/27/20 1029 75  Resp 07/27/20 1029 17     Temp 07/27/20 1029 98.6 F (37 C)     Temp Source 07/27/20 1029 Oral     SpO2 07/27/20 1029 98 %     Weight --      Height --      Head Circumference --      Peak Flow --      Pain Score 07/27/20 1023 8     Pain Loc --      Pain Edu? --      Excl. in GC? --    No data found.  Updated Vital Signs BP (!) 143/80 (BP Location: Right Arm)    Pulse 75    Temp 98.6 F (37 C) (Oral)    Resp 17    LMP 06/22/2020    SpO2 98%   Visual Acuity Right Eye Distance:   Left Eye Distance:   Bilateral Distance:    Right Eye Near:   Left Eye Near:    Bilateral Near:     Physical Exam Vitals and nursing note reviewed.  Constitutional:      Appearance: She is well-developed.     Comments: No acute distress  HENT:     Head: Normocephalic and atraumatic.     Ears:     Comments: Bilateral ears without  tenderness to palpation of external auricle, tragus and mastoid, EAC's without erythema or swelling, TM's with good bony landmarks and cone of light. Non erythematous.     Nose: Nose normal.     Mouth/Throat:     Comments: Oral mucosa pink and moist, no tonsillar enlargement or exudate. Posterior pharynx patent and nonerythematous, no uvula deviation or swelling. Normal phonation. Eyes:     Conjunctiva/sclera: Conjunctivae normal.  Cardiovascular:     Rate and Rhythm: Normal rate.  Pulmonary:     Effort: Pulmonary effort is normal. No respiratory distress.     Comments: Breathing comfortably at rest, CTABL, no wheezing, rales or other adventitious sounds auscultated Abdominal:     General: There is no distension.  Musculoskeletal:        General: Normal range of motion.     Cervical back: Neck supple.  Skin:    General: Skin is warm and dry.  Neurological:     Mental Status: She is alert and oriented to person, place, and time.      UC Treatments / Results  Labs (all labs ordered are listed, but only abnormal results are displayed) Labs Reviewed  NOVEL CORONAVIRUS, NAA  CULTURE, GROUP A STREP [facility])  POCT RAPID STREP A (OFFICE)    EKG   Radiology No results found.  Procedures Procedures (including critical care time)  Medications Ordered in UC Medications - No data to display  Initial Impression / Assessment and Plan / UC Course  I have reviewed the triage vital signs and the nursing notes.  Pertinent labs & imaging results that were available during my care of the patient were reviewed by me and considered in my medical decision making (see chart for details).     Strep test negative, Covid test pending.  Suspect likely viral etiology/related to postnasal drainage.  Recommending symptomatic and supportive care rest and fluids.  Discussed strict return precautions. Patient verbalized understanding and is agreeable with plan.  Final Clinical Impressions(s) / UC  Diagnoses   Final diagnoses:  Encounter for laboratory testing for COVID-19 virus  Sore throat     Discharge Instructions     Sore Throat  Your rapid strep tested Negative today. We will send for a culture and call in about 2 days if results are positive. For now we will treat your sore throat as a virus with symptom management.   Please continue Tylenol or Ibuprofen for fever and pain. May try salt water gargles, cepacol lozenges, throat spray, or OTC cold relief medicine for throat discomfort. If you also have congestion take a daily anti-histamine like Zyrtec, Claritin, and a oral decongestant to help with post nasal drip that may be irritating your throat.   Stay hydrated and drink plenty of fluids to keep your throat coated relieve irritation.     ED Prescriptions    Medication Sig Dispense Auth. Provider   ibuprofen (ADVIL) 800 MG tablet Take 1 tablet (800 mg total) by mouth 3 (three) times daily. 21 tablet Rachelle Edwards, Freeman C, PA-C     PDMP not reviewed this encounter.   Lew Dawes, [place] 07/27/20 1243

## 2020-07-27 NOTE — ED Triage Notes (Addendum)
Patient reports sore throat since 27th. No nasal drainage or congestion. No fever.   Possible COVID exposure. Had COVID in December.   2nd dose of vaccine received on the 25th.

## 2020-07-29 LAB — NOVEL CORONAVIRUS, NAA: SARS-CoV-2, NAA: NOT DETECTED

## 2020-07-30 LAB — CULTURE, GROUP A STREP (THRC)

## 2020-11-23 ENCOUNTER — Ambulatory Visit
Admission: EM | Admit: 2020-11-23 | Discharge: 2020-11-23 | Disposition: A | Discharge status: Home / Self Care | Payer: BLUE CROSS/BLUE SHIELD | Attending: Emergency Medicine | Admitting: Emergency Medicine

## 2020-11-23 ENCOUNTER — Other Ambulatory Visit: Payer: Self-pay

## 2020-11-23 DIAGNOSIS — R001 Bradycardia, unspecified: Secondary | ICD-10-CM | POA: Diagnosis not present

## 2020-11-23 DIAGNOSIS — R42 Dizziness and giddiness: Secondary | ICD-10-CM

## 2020-11-23 MED ORDER — MECLIZINE HCL 25 MG PO TABS: 25.0000 mg | ORAL_TABLET | Freq: Three times a day (TID) | ORAL | 0 refills | Status: DC | PRN

## 2020-11-23 NOTE — ED Provider Notes (Signed)
[name]    CSN: [id number] Arrival date & time: 11/23/20  1911      History   Chief Complaint Chief Complaint  Patient presents with  . Dizziness    About 3 hours ago    HPI Theresa Horne is a 29 y.o. female  History of hypertension, obesity, bradycardia presenting for resolved episode of dizziness and lightheadedness.  States this is happened in the past, though feels like it is becoming more frequent.  Describes episodes as brief, "like anxiety".  States that sometimes she feels like she cannot catch her breath.  Denying palpitations, change in vision or hearing, nausea or vomiting, syncopal events.  Past Medical History:  Diagnosis Date  . Hypertension   . Preterm labor     Patient Active Problem List   Diagnosis Date Noted  . Normal labor and delivery 04/27/2019  . Anemia 10/04/2015  . Abdominal pressure 06/09/2015  . History of preterm delivery, currently pregnant -- delivered at 25 wks in March 2012 06/09/2015  . Hydradenitis 04/16/2012    Past Surgical History:  Procedure Laterality Date  . INDUCED ABORTION      OB History    Gravida  4   Para  3   Term  2   Preterm  1   AB  1   Living  3     SAB      IAB  1   Ectopic      Multiple  0   Live Births  3            Home Medications    Prior to Admission medications   Medication Sig Start Date End Date Taking? Authorizing Provider  ibuprofen (ADVIL) 800 MG tablet Take 1 tablet (800 mg total) by mouth 3 (three) times daily. 07/27/20  Yes Wieters, Hallie C, PA-C  meclizine (ANTIVERT) 25 MG tablet Take 1 tablet (25 mg total) by mouth 3 (three) times daily as needed for dizziness. 11/23/20  Yes Hall-Potvin, [place], PA-C  ferrous sulfate 325 (65 FE) MG tablet Take 1 tablet (325 mg total) by mouth daily. 04/29/19 04/28/20  Prothero, Henderson Newcomer, CNM    Family History Family History  Problem Relation Age of Onset  . Hypertension Father   . Diabetes Paternal Uncle    . Renal Disease Maternal Grandmother     Social History Social History   Tobacco Use  . Smoking status: Former Smoker    Packs/day: 0.00    Types: Cigarettes    Quit date: 01/26/2015    Years since quitting: 5.8  . Smokeless tobacco: Never Used  Vaping Use  . Vaping Use: Never used  Substance Use Topics  . Alcohol use: Not Currently    Comment: social use before preg  . Drug use: No     Allergies   Patient has no known allergies.   Review of Systems Review of Systems  Constitutional: Negative for fatigue and fever.  HENT: Negative for ear pain, sinus pain, sore throat and voice change.   Eyes: Negative for pain, redness and visual disturbance.  Respiratory: Positive for shortness of breath. Negative for cough.   Cardiovascular: Negative for chest pain and palpitations.  Gastrointestinal: Negative for abdominal pain, diarrhea and vomiting.  Musculoskeletal: Negative for arthralgias and myalgias.  Skin: Negative for rash and wound.  Neurological: Positive for dizziness. Negative for tremors, syncope, facial asymmetry, speech difficulty, weakness, light-headedness, numbness and headaches.     Physical Exam Triage Vital Signs ED  Triage Vitals  Enc Vitals Group     BP 11/23/20 2045 (!) 149/98     Pulse Rate 11/23/20 2045 (!) 48     Resp 11/23/20 2045 20     Temp 11/23/20 2045 98 F (36.7 C)     Temp Source 11/23/20 2045 Oral     SpO2 11/23/20 2045 97 %     Weight --      Height --      Head Circumference --      Peak Flow --      Pain Score 11/23/20 2051 0     Pain Loc --      Pain Edu? --      Excl. in GC? --    No data found.  Updated Vital Signs BP (!) 149/98 (BP Location: Left Arm)   Pulse (!) 48   Temp 98 F (36.7 C) (Oral)   Resp 20   LMP 11/18/2020 (Approximate)   SpO2 97%   Visual Acuity Right Eye Distance:   Left Eye Distance:   Bilateral Distance:    Right Eye Near:   Left Eye Near:    Bilateral Near:     Physical  Exam Constitutional:      General: She is not in acute distress.    Appearance: She is not ill-appearing or diaphoretic.  HENT:     Head: Normocephalic and atraumatic.     Right Ear: Tympanic membrane and ear canal normal.     Left Ear: Tympanic membrane and ear canal normal.     Mouth/Throat:     Mouth: Mucous membranes are moist.     Pharynx: Oropharynx is clear. No oropharyngeal exudate or posterior oropharyngeal erythema.  Eyes:     General: No scleral icterus.    Conjunctiva/sclera: Conjunctivae normal.     Pupils: Pupils are equal, round, and reactive to light.  Neck:     Comments: Trachea midline, negative JVD Cardiovascular:     Rate and Rhythm: Normal rate and regular rhythm.     Heart sounds: No murmur heard. No gallop.      Comments: ?murmur vs PVC/PAC Pulmonary:     Effort: Pulmonary effort is normal. No respiratory distress.     Breath sounds: No wheezing, rhonchi or rales.  Musculoskeletal:     Cervical back: Neck supple. No tenderness.  Lymphadenopathy:     Cervical: No cervical adenopathy.  Skin:    Capillary Refill: Capillary refill takes less than 2 seconds.     Coloration: Skin is not jaundiced or pale.     Findings: No rash.  Neurological:     General: No focal deficit present.     Mental Status: She is alert and oriented to person, place, and time.      UC Treatments / Results  Labs (all labs ordered are listed, but only abnormal results are displayed) Labs Reviewed - No data to display  EKG   Radiology No results found.  Procedures Procedures (including critical care time)  Medications Ordered in UC Medications - No data to display  Initial Impression / Assessment and Plan / UC Course  I have reviewed the triage vital signs and the nursing notes.  Pertinent labs & imaging results that were available during my care of the patient were reviewed by me and considered in my medical decision making (see chart for details).     Patient  febrile, nontoxic in office.  Currently asymptomatic.  EKG done office, reviewed by me compared to  previous from April 2018: Sinus rhythm with PVC, PAC.  Ventricular rate 70 bpm.  No QTC prolongation, ST elevation or depression.  Waveforms stable.  Will follow up with cardiology, PCP for further evaluation/management.  Avoid caffeine and other triggers, keep track of symptoms.  Strict ER return precautions discussed, pt verbalized understanding and is agreeable to plan. Final Clinical Impressions(s) / UC Diagnoses   Final diagnoses:  Dizziness  Bradycardia     Discharge Instructions     Keep track of symptoms. Follow up with PCP and/or cardiology.    ED Prescriptions    Medication Sig Dispense Auth. Provider   meclizine (ANTIVERT) 25 MG tablet Take 1 tablet (25 mg total) by mouth 3 (three) times daily as needed for dizziness. 30 tablet Hall-Potvin, [place], PA-C     PDMP not reviewed this encounter.   Hall-Potvin, Theresa, [place] 11/24/20 [phone]

## 2020-11-23 NOTE — ED Triage Notes (Signed)
Patient states  She had a incident today where she felt like she couldn't catch her breath and was dizzy and l;ightheaded. Pt states she feels she is breathing fine but still has some lightheadedness. Pt is aox4 and ambulatory.

## 2020-11-23 NOTE — Discharge Instructions (Signed)
Keep track of symptoms. Follow up with PCP and/or cardiology.

## 2020-11-24 ENCOUNTER — Telehealth: Payer: Self-pay

## 2020-11-24 MED ORDER — MECLIZINE HCL 25 MG PO TABS: 25.0000 mg | ORAL_TABLET | Freq: Three times a day (TID) | ORAL | 0 refills | Status: DC | PRN

## 2020-11-25 ENCOUNTER — Encounter (HOSPITAL_COMMUNITY): Payer: Self-pay | Admitting: *Deleted

## 2020-11-25 ENCOUNTER — Other Ambulatory Visit: Payer: Self-pay

## 2020-11-25 ENCOUNTER — Emergency Department (HOSPITAL_COMMUNITY)
Admission: EM | Admit: 2020-11-25 | Discharge: 2020-11-26 | Disposition: A | Discharge status: Home / Self Care | Payer: Medicaid Other | Attending: Emergency Medicine | Admitting: Emergency Medicine

## 2020-11-25 DIAGNOSIS — R0602 Shortness of breath: Secondary | ICD-10-CM | POA: Insufficient documentation

## 2020-11-25 DIAGNOSIS — Z5321 Procedure and treatment not carried out due to patient leaving prior to being seen by health care provider: Secondary | ICD-10-CM | POA: Diagnosis not present

## 2020-11-25 DIAGNOSIS — R079 Chest pain, unspecified: Secondary | ICD-10-CM | POA: Insufficient documentation

## 2020-11-25 DIAGNOSIS — R109 Unspecified abdominal pain: Secondary | ICD-10-CM | POA: Diagnosis not present

## 2020-11-25 LAB — URINALYSIS, ROUTINE W REFLEX MICROSCOPIC
Bilirubin Urine: NEGATIVE
Glucose, UA: NEGATIVE mg/dL
Ketones, ur: NEGATIVE mg/dL
Nitrite: NEGATIVE
Protein, ur: NEGATIVE mg/dL
Specific Gravity, Urine: 1.024 (ref 1.005–1.030)
pH: 5 (ref 5.0–8.0)

## 2020-11-25 LAB — COMPREHENSIVE METABOLIC PANEL
ALT: 13 U/L (ref 0–44)
AST: 17 U/L (ref 15–41)
Albumin: 4 g/dL (ref 3.5–5.0)
Alkaline Phosphatase: 53 U/L (ref 38–126)
Anion gap: 10 (ref 5–15)
BUN: 13 mg/dL (ref 6–20)
CO2: 26 mmol/L (ref 22–32)
Calcium: 9.9 mg/dL (ref 8.9–10.3)
Chloride: 105 mmol/L (ref 98–111)
Creatinine, Ser: 0.96 mg/dL (ref 0.44–1.00)
GFR, Estimated: >60 mL/min (ref >60)
Glucose, Bld: 65 mg/dL — ABNORMAL LOW (ref 70–99)
Potassium: 3.8 mmol/L (ref 3.5–5.1)
Sodium: 141 mmol/L (ref 135–145)
Total Bilirubin: 0.3 mg/dL (ref 0.3–1.2)
Total Protein: 8 g/dL (ref 6.5–8.1)

## 2020-11-25 LAB — TROPONIN I (HIGH SENSITIVITY)
Troponin I (High Sensitivity): 3 ng/L (ref <18)
Troponin I (High Sensitivity): 3 ng/L (ref <18)

## 2020-11-25 LAB — CBC
HCT: 37.7 % (ref 36.0–46.0)
Hemoglobin: 12.1 g/dL (ref 12.0–15.0)
MCH: 27.8 pg (ref 26.0–34.0)
MCHC: 32.1 g/dL (ref 30.0–36.0)
MCV: 86.5 fL (ref 80.0–100.0)
Platelets: 189 10*3/uL (ref 150–400)
RBC: 4.36 MIL/uL (ref 3.87–5.11)
RDW: 14 % (ref 11.5–15.5)
WBC: 6.7 10*3/uL (ref 4.0–10.5)
nRBC: 0 % (ref 0.0–0.2)

## 2020-11-25 LAB — LIPASE, BLOOD: Lipase: 26 U/L (ref 11–51)

## 2020-11-25 LAB — I-STAT BETA HCG BLOOD, ED (MC, WL, AP ONLY): I-stat hCG, quantitative: <5 m[IU]/mL (ref <5)

## 2020-11-25 NOTE — ED Triage Notes (Signed)
Has had the vaccines for covid

## 2020-11-25 NOTE — ED Notes (Signed)
Called for vitals recheck x1 with no response 

## 2020-11-25 NOTE — ED Triage Notes (Signed)
The pt is c/o abd and chest pain with sob  None now  Since Monday  [facility] for [facility] and was told she had pvcs  lmp last monday

## 2020-11-25 NOTE — ED Notes (Signed)
Called x2 for vitals recheck with no response 

## 2020-12-11 ENCOUNTER — Encounter: Payer: Self-pay | Admitting: Internal Medicine

## 2020-12-11 ENCOUNTER — Ambulatory Visit (INDEPENDENT_AMBULATORY_CARE_PROVIDER_SITE_OTHER): Payer: Medicaid Other | Admitting: Internal Medicine

## 2020-12-11 ENCOUNTER — Other Ambulatory Visit: Payer: Self-pay

## 2020-12-11 ENCOUNTER — Encounter: Payer: Self-pay | Admitting: Radiology

## 2020-12-11 ENCOUNTER — Ambulatory Visit (INDEPENDENT_AMBULATORY_CARE_PROVIDER_SITE_OTHER): Payer: Medicaid Other

## 2020-12-11 VITALS — BP 128/82 | HR 79 | Ht 64.0 in | Wt 241.6 lb

## 2020-12-11 DIAGNOSIS — I491 Atrial premature depolarization: Secondary | ICD-10-CM | POA: Diagnosis not present

## 2020-12-11 DIAGNOSIS — R002 Palpitations: Secondary | ICD-10-CM | POA: Diagnosis not present

## 2020-12-11 DIAGNOSIS — O09899 Supervision of other high risk pregnancies, unspecified trimester: Secondary | ICD-10-CM | POA: Diagnosis not present

## 2020-12-11 DIAGNOSIS — R030 Elevated blood-pressure reading, without diagnosis of hypertension: Secondary | ICD-10-CM

## 2020-12-11 DIAGNOSIS — I493 Ventricular premature depolarization: Secondary | ICD-10-CM

## 2020-12-11 NOTE — Patient Instructions (Signed)
Medication Instructions:  No Changes In Medications at this time.  *If you need a refill on your cardiac medications before your next appointment, please call your pharmacy*  Testing/Procedures: Your physician has requested that you have an echocardiogram. Echocardiography is a painless test that uses sound waves to create images of your heart. It provides your doctor with information about the size and shape of your heart and how well your heart's chambers and valves are working. You may receive an ultrasound enhancing agent through an IV if needed to better visualize your heart during the echo.This procedure takes approximately one hour. There are no restrictions for this procedure. This will take place at the [address].   Follow-Up: At [facility], you and your health needs are our priority.  As part of our continuing mission to provide you with exceptional heart care, we have created designated Provider Care Teams.  These Care Teams include your primary Cardiologist (physician) and Advanced Practice Providers (APPs -  Physician Assistants and Nurse Practitioners) who all work together to provide you with the care you need, when you need it.   Your next appointment:   AFTER ECHO  The format for your next appointment:   In Person  Provider:   Weston Brass, MD  Other Instructions  ZIO XT- Long Term Monitor Instructions   Your physician has requested you wear your ZIO patch monitor 3 days.   This is a single patch monitor.  [employer] supplies one patch monitor per enrollment.  Additional stickers are not available.   Please do not apply patch if you will be having a Nuclear Stress Test, Echocardiogram, Cardiac CT, MRI, or Chest Xray during the time frame you would be wearing the monitor. The patch cannot be worn during these tests.  You cannot remove and re-apply the ZIO XT patch monitor.   Your ZIO patch monitor will be sent USPS Priority mail from [facility] directly to your home address. The monitor may also be mailed to a PO BOX if home delivery is not available.   It may take 3-5 days to receive your monitor after you have been enrolled.   Once you have received you monitor, please review enclosed instructions.  Your monitor has already been registered assigning a specific monitor serial # to you.   Applying the monitor   Shave hair from upper left chest.   Hold abrader disc by orange tab.  Rub abrader in 40 strokes over left upper chest as indicated in your monitor instructions.   Clean area with 4 enclosed alcohol pads .  Use all pads to assure are is cleaned thoroughly.  Let dry.   Apply patch as indicated in monitor instructions.  Patch will be place under collarbone on left side of chest with arrow pointing upward.   Rub patch adhesive wings for 2 minutes.Remove white label marked "1".  Remove white label marked "2".  Rub patch adhesive wings for 2 additional minutes.   While looking in a mirror, press and release button in center of patch.  A small green light will flash 3-4 times .  This will be your only indicator the monitor has been turned on.     Do not shower for the first 24 hours.  You may shower after the first 24 hours.   Press button if you feel a symptom. You will hear a small click.  Record Date, Time and Symptom in the Patient Log Book.   When  you are ready to remove patch, follow instructions on last 2 pages of Patient Log Book.  Stick patch monitor onto last page of Patient Log Book.   Place Patient Log Book in [place] box.  Use locking tab on box and tape box closed securely.  The [employer] has [employer] on it.  Please place in mailbox as soon as possible.  Your physician should have your test results approximately 7 days after the monitor has been mailed back to [facility].   Call [facility] Customer Care at [phone] if you have questions regarding your ZIO XT patch monitor.   Call them immediately if you see an orange light blinking on your monitor.   If your monitor falls off in less than 4 days contact our Monitor department at [phone].  If your monitor becomes loose or falls off after 4 days call [employer] at [phone] for suggestions on securing your monitor. Marland Kitchen

## 2020-12-11 NOTE — Progress Notes (Signed)
Enrolled patient for a 3 day Zio XT Monitor to be mailed to patients home.  °

## 2020-12-11 NOTE — Progress Notes (Signed)
Cardiology Office Note:    Date:  12/11/2020   ID:  Theresa Horne, DOB 1990/12/21, MRN [id number]  PCP:  Patient, No Pcp Per  Cardiologist:  No primary care provider on file.  Electrophysiologist:  None   Referring MD: Shea Evans, *   Chief Complaint/Reason for Referral: Dizziness  History of Present Illness:    Theresa Horne is a 30 y.o. female with a history of elevated BP and preterm labor who presents for dizziness and subjective bradycardia.   Feeling like anxiety, SOB, dizziness, feels like her heart is beating slow. Going on for years. No syncope, some presyncope 2 years ago.  Some high blood pressure. No GHTN, OR GDM.  Preterm labor with first baby. 2 other healthy. No miscarriages.   MG - open heart surgery, IHD DAD - HTN, DM Sis - DM Mom - HTN PGGM - enlarged heart Mom's first cousin - MI fatal 29s All her kids have heart murmurs.   Caffeine: 1 cup of coffee Alcohol: 1 x every two weeks Water intake: 4 bottles a day Snoring: possible TSH: need to check thyroid.  Herbal supplements/diet products: no Syncope/presyncope: no    Past Medical History:  Diagnosis Date  . Hypertension   . Preterm labor     Past Surgical History:  Procedure Laterality Date  . INDUCED ABORTION      Current Medications: No outpatient medications have been marked as taking for the 12/11/20 encounter (Office Visit) with Parke Poisson, MD.     Allergies:   Patient has no known allergies.   Social History   Tobacco Use  . Smoking status: Former Smoker    Packs/day: 0.00    Types: Cigarettes    Quit date: 01/26/2015    Years since quitting: 5.8  . Smokeless tobacco: Never Used  Vaping Use  . Vaping Use: Never used  Substance Use Topics  . Alcohol use: Not Currently    Comment: social use before preg  . Drug use: No     Family History: The patient's family history includes Diabetes in her paternal uncle; Hypertension in her father; Renal  Disease in her maternal grandmother.  ROS:   Please see the history of present illness.    All other systems reviewed and are negative.  EKGs/Labs/Other Studies Reviewed:    The following studies were reviewed today:  EKG:  SR with PACs in bigeminy.  I have independently reviewed the images from CTA chest 10/25/2015, no coronary calcifications. Grossly normal Pveins. Normal coronary origins.  Recent Labs: 11/25/2020: ALT 13; BUN 13; Creatinine, Ser 0.96; Hemoglobin 12.1; Platelets 189; Potassium 3.8; Sodium 141  Recent Lipid Panel No results found for: CHOL, TRIG, HDL, CHOLHDL, VLDL, LDLCALC, LDLDIRECT  Physical Exam:    VS:  BP 128/82 (BP Location: Left Arm, Patient Position: Sitting)   Pulse 79   Ht 5\' 4"  (1.626 m)   Wt 241 lb 9.6 oz (109.6 kg)   LMP 11/18/2020 (Approximate)   SpO2 98%   BMI 41.47 kg/m     Wt Readings from Last 5 Encounters:  12/11/20 241 lb 9.6 oz (109.6 kg)  11/25/20 268 lb 8.3 oz (121.8 kg)  04/27/19 268 lb 9.6 oz (121.8 kg)  09/17/18 255 lb 1.9 oz (115.7 kg)  03/22/18 240 lb (108.9 kg)    Constitutional: No acute distress Eyes: sclera non-icteric, normal conjunctiva and lids ENMT: normal dentition, moist mucous membranes Cardiovascular: regular rhythm, normal rate, no murmurs. S1 and S2 normal. Radial pulses normal  bilaterally. No jugular venous distention.  Respiratory: clear to auscultation bilaterally GI : normal bowel sounds, soft and nontender. No distention.   MSK: extremities warm, well perfused. No edema.  NEURO: grossly nonfocal exam, moves all extremities. PSYCH: alert and oriented x 3, normal mood and affect.   ASSESSMENT:    1. PAC (premature atrial contraction)   2. Palpitations   3. History of preterm delivery, currently pregnant -- delivered at 25 wks in March 2012   4. Elevated BP without diagnosis of hypertension    PLAN:    Very frequent PACs, appears to be wide QRS complexes and may represent PVCs or abberant PACs. Will  obtain cardiac monitor for quantitation. Will also obtain echocardiogram to rule out structural issues.   BP normal today.  No changes to therapy today.   We reviewed cardiovascular health and risk reduction in detail today.   Total time of encounter: 45 minutes total time of encounter, including 30 minutes spent in face-to-face patient care on the date of this encounter. This time includes coordination of care and counseling regarding above mentioned problem list. Remainder of non-face-to-face time involved reviewing chart documents/testing relevant to the patient encounter and documentation in the medical record. I have independently reviewed documentation from referring provider.   Weston Brass, MD [place]  [facility]    Medication Adjustments/Labs and Tests Ordered: Current medicines are reviewed at length with the patient today.  Concerns regarding medicines are outlined above.   Orders Placed This Encounter  Procedures  . LONG TERM MONITOR (3-14 DAYS)  . EKG 12-Lead  . ECHOCARDIOGRAM COMPLETE     No orders of the defined types were placed in this encounter.   Patient Instructions  Medication Instructions:  No Changes In Medications at this time.  *If you need a refill on your cardiac medications before your next appointment, please call your pharmacy*  Testing/Procedures: Your physician has requested that you have an echocardiogram. Echocardiography is a painless test that uses sound waves to create images of your heart. It provides your doctor with information about the size and shape of your heart and how well your heart's chambers and valves are working. You may receive an ultrasound enhancing agent through an IV if needed to better visualize your heart during the echo.This procedure takes approximately one hour. There are no restrictions for this procedure. This will take place at the [address].   Follow-Up: At [facility], you and  your health needs are our priority.  As part of our continuing mission to provide you with exceptional heart care, we have created designated Provider Care Teams.  These Care Teams include your primary Cardiologist (physician) and Advanced Practice Providers (APPs -  Physician Assistants and Nurse Practitioners) who all work together to provide you with the care you need, when you need it.   Your next appointment:   AFTER ECHO  The format for your next appointment:   In Person  Provider:   Weston Brass, MD  Other Instructions  ZIO XT- Long Term Monitor Instructions   Your physician has requested you wear your ZIO patch monitor 3 days.   This is a single patch monitor.  [employer] supplies one patch monitor per enrollment.  Additional stickers are not available.   Please do not apply patch if you will be having a Nuclear Stress Test, Echocardiogram, Cardiac CT, MRI, or Chest Xray during the time frame you would be wearing the monitor. The patch cannot be  worn during these tests.  You cannot remove and re-apply the ZIO XT patch monitor.   Your ZIO patch monitor will be sent USPS Priority mail from [facility] directly to your home address. The monitor may also be mailed to a PO BOX if home delivery is not available.   It may take 3-5 days to receive your monitor after you have been enrolled.   Once you have received you monitor, please review enclosed instructions.  Your monitor has already been registered assigning a specific monitor serial # to you.   Applying the monitor   Shave hair from upper left chest.   Hold abrader disc by orange tab.  Rub abrader in 40 strokes over left upper chest as indicated in your monitor instructions.   Clean area with 4 enclosed alcohol pads .  Use all pads to assure are is cleaned thoroughly.  Let dry.   Apply patch as indicated in monitor instructions.  Patch will be place under collarbone on left side of chest with arrow pointing upward.    Rub patch adhesive wings for 2 minutes.Remove white label marked "1".  Remove white label marked "2".  Rub patch adhesive wings for 2 additional minutes.   While looking in a mirror, press and release button in center of patch.  A small green light will flash 3-4 times .  This will be your only indicator the monitor has been turned on.     Do not shower for the first 24 hours.  You may shower after the first 24 hours.   Press button if you feel a symptom. You will hear a small click.  Record Date, Time and Symptom in the Patient Log Book.   When you are ready to remove patch, follow instructions on last 2 pages of Patient Log Book.  Stick patch monitor onto last page of Patient Log Book.   Place Patient Log Book in [place] box.  Use locking tab on box and tape box closed securely.  The [employer] has [employer] on it.  Please place in mailbox as soon as possible.  Your physician should have your test results approximately 7 days after the monitor has been mailed back to [facility].   Call [facility] Customer Care at [phone] if you have questions regarding your ZIO XT patch monitor.  Call them immediately if you see an orange light blinking on your monitor.   If your monitor falls off in less than 4 days contact our Monitor department at [phone].  If your monitor becomes loose or falls off after 4 days call [employer] at [phone] for suggestions on securing your monitor. Marland Kitchen

## 2020-12-16 ENCOUNTER — Telehealth: Payer: Medicaid Other | Admitting: Family

## 2020-12-16 DIAGNOSIS — B373 Candidiasis of vulva and vagina: Secondary | ICD-10-CM | POA: Diagnosis not present

## 2020-12-16 DIAGNOSIS — B3731 Acute candidiasis of vulva and vagina: Secondary | ICD-10-CM

## 2020-12-16 MED ORDER — FLUCONAZOLE 150 MG PO TABS: 150.0000 mg | ORAL_TABLET | ORAL | 0 refills | Status: DC | PRN

## 2020-12-16 NOTE — Progress Notes (Signed)

## 2020-12-19 DIAGNOSIS — I493 Ventricular premature depolarization: Secondary | ICD-10-CM

## 2020-12-19 DIAGNOSIS — R002 Palpitations: Secondary | ICD-10-CM | POA: Diagnosis not present

## 2020-12-30 ENCOUNTER — Other Ambulatory Visit (HOSPITAL_COMMUNITY): Payer: Medicaid Other

## 2021-01-04 ENCOUNTER — Ambulatory Visit (INDEPENDENT_AMBULATORY_CARE_PROVIDER_SITE_OTHER): Payer: Medicaid Other | Admitting: Internal Medicine

## 2021-01-04 ENCOUNTER — Encounter: Payer: Self-pay | Admitting: Internal Medicine

## 2021-01-04 ENCOUNTER — Other Ambulatory Visit: Payer: Self-pay

## 2021-01-04 VITALS — BP 130/80 | HR 72 | Ht 64.0 in | Wt 238.0 lb

## 2021-01-04 DIAGNOSIS — I491 Atrial premature depolarization: Secondary | ICD-10-CM

## 2021-01-04 DIAGNOSIS — R002 Palpitations: Secondary | ICD-10-CM | POA: Diagnosis not present

## 2021-01-04 DIAGNOSIS — R42 Dizziness and giddiness: Secondary | ICD-10-CM

## 2021-01-04 MED ORDER — METOPROLOL SUCCINATE ER 25 MG PO TB24: 25.0000 mg | ORAL_TABLET | Freq: Every day | ORAL | 3 refills | Status: DC

## 2021-01-04 NOTE — Patient Instructions (Addendum)
Medication Instructions:  START METOPROLOL SUCCINATE (TOPROL XL) 25mg  DAILY  *If you need a refill on your cardiac medications before your next appointment, please call your pharmacy*  Testing/Procedures: KEEP ECHO APPOINTMENT AS SCHEDULED   Follow-Up: At [facility], you and your health needs are our priority.  As part of our continuing mission to provide you with exceptional heart care, we have created designated Provider Care Teams.  These Care Teams include your primary Cardiologist (physician) and Advanced Practice Providers (APPs -  Physician Assistants and Nurse Practitioners) who all work together to provide you with the care you need, when you need it.  Your next appointment:   FOLLOW UP AFTER ECHOCARDIOGRAM   The format for your next appointment:   In Person  Provider:   [facility], MD

## 2021-01-04 NOTE — Progress Notes (Unsigned)
Cardiology Office Note:    Date:  01/04/2021   ID:  Theresa Horne, DOB 1991-08-27, MRN [id number]  PCP:  Patient, No Pcp Per  Cardiologist:  No primary care provider on file.  Electrophysiologist:  None   Referring MD: No ref. provider found   Chief Complaint/Reason for Referral: Follow up dizziness  History of Present Illness:    Theresa Horne is a 30 y.o. female with a history of history of elevated BP and preterm labor who presents for dizziness and subjective bradycardia.  Found to have frequent PACs on ECG.  Monitor shows 26% PACs. Echo has not been completed. She has had no significant change in symptoms. We discussed beta blockade to reduced PACs and will reevaluate symptoms after echo and a few weeks of beta blockade. No chest pain.   Past Medical History:  Diagnosis Date  . Hypertension   . Preterm labor     Past Surgical History:  Procedure Laterality Date  . INDUCED ABORTION      Current Medications: Current Meds  Medication Sig  . metoprolol succinate (TOPROL-XL) 25 MG 24 hr tablet Take 1 tablet (25 mg total) by mouth daily.     Allergies:   Patient has no known allergies.   Social History   Tobacco Use  . Smoking status: Former Smoker    Packs/day: 0.00    Types: Cigarettes    Quit date: 01/26/2015    Years since quitting: 5.9  . Smokeless tobacco: Never Used  Vaping Use  . Vaping Use: Never used  Substance Use Topics  . Alcohol use: Not Currently    Comment: social use before preg  . Drug use: No     Family History: The patient's family history includes Diabetes in her paternal uncle; Hypertension in her father; Renal Disease in her maternal grandmother.  ROS:   Please see the history of present illness.    All other systems reviewed and are negative.  EKGs/Labs/Other Studies Reviewed:    The following studies were reviewed today:  Recent Labs: 11/25/2020: ALT 13; BUN 13; Creatinine, Ser 0.96; Hemoglobin 12.1; Platelets 189;  Potassium 3.8; Sodium 141  Recent Lipid Panel No results found for: CHOL, TRIG, HDL, CHOLHDL, VLDL, LDLCALC, LDLDIRECT  Physical Exam:    VS:  BP 130/80 (BP Location: Left Arm, Patient Position: Sitting, Cuff Size: Normal)   Pulse 72   Ht 5\' 4"  (1.626 m)   Wt 238 lb (108 kg)   SpO2 96%   BMI 40.85 kg/m     Wt Readings from Last 5 Encounters:  01/04/21 238 lb (108 kg)  12/11/20 241 lb 9.6 oz (109.6 kg)  11/25/20 268 lb 8.3 oz (121.8 kg)  04/27/19 268 lb 9.6 oz (121.8 kg)  09/17/18 255 lb 1.9 oz (115.7 kg)    Constitutional: No acute distress Eyes: sclera non-icteric, normal conjunctiva and lids ENMT: normal dentition, moist mucous membranes Cardiovascular: regularly irregular rhythm, normal rate, no murmurs. S1 and S2 normal. Radial pulses normal bilaterally. No jugular venous distention.  Respiratory: clear to auscultation bilaterally GI : normal bowel sounds, soft and nontender. No distention.   MSK: extremities warm, well perfused. No edema.  NEURO: grossly nonfocal exam, moves all extremities. PSYCH: alert and oriented x 3, normal mood and affect.   ASSESSMENT:    1. PAC (premature atrial contraction)   2. Palpitations   3. Dizziness    PLAN:    PAC (premature atrial contraction) Palpitations Dizziness - will trial metoprolol succinate 25  mg daily. She is of child bearing age but is not planning pregnancy. We discussed calling our office if she becomes pregnant or is planning a pregnancy so we can switch to labetalol or discontinue. BB in pregnancy discussed (IUGR).  Will follow up after echo.   Total time of encounter: 20 minutes total time of encounter, including 15 minutes spent in face-to-face patient care on the date of this encounter. This time includes coordination of care and counseling regarding above mentioned problem list. Remainder of non-face-to-face time involved reviewing chart documents/testing relevant to the patient encounter and documentation in  the medical record. I have independently reviewed documentation from referring provider.   Weston Brass, MD [place]  [facility]    Medication Adjustments/Labs and Tests Ordered: Current medicines are reviewed at length with the patient today.  Concerns regarding medicines are outlined above.   No orders of the defined types were placed in this encounter.   Meds ordered this encounter  Medications  . metoprolol succinate (TOPROL-XL) 25 MG 24 hr tablet    Sig: Take 1 tablet (25 mg total) by mouth daily.    Dispense:  30 tablet    Refill:  3    Patient Instructions  Medication Instructions:  START METOPROLOL SUCCINATE (TOPROL XL) 25mg  DAILY  *If you need a refill on your cardiac medications before your next appointment, please call your pharmacy*  Testing/Procedures: KEEP ECHO APPOINTMENT AS SCHEDULED   Follow-Up: At [facility], you and your health needs are our priority.  As part of our continuing mission to provide you with exceptional heart care, we have created designated Provider Care Teams.  These Care Teams include your primary Cardiologist (physician) and Advanced Practice Providers (APPs -  Physician Assistants and Nurse Practitioners) who all work together to provide you with the care you need, when you need it.  Your next appointment:   FOLLOW UP AFTER ECHOCARDIOGRAM   The format for your next appointment:   In Person  Provider:   [facility], MD

## 2021-01-14 ENCOUNTER — Telehealth: Payer: Medicaid Other | Admitting: Physician Assistant

## 2021-01-14 DIAGNOSIS — N760 Acute vaginitis: Secondary | ICD-10-CM

## 2021-01-14 MED ORDER — METRONIDAZOLE 500 MG PO TABS: 500.0000 mg | ORAL_TABLET | Freq: Two times a day (BID) | ORAL | 0 refills | Status: DC

## 2021-01-14 NOTE — Progress Notes (Signed)
We are sorry that you are not feeling well. Here is how we plan to help! Based on what you shared with me it looks like you: May have a vaginosis due to bacteria  Vaginosis is an inflammation of the vagina that can result in discharge, itching and pain. The cause is usually a change in the normal balance of vaginal bacteria or an infection. Vaginosis can also result from reduced estrogen levels after menopause.  The most common causes of vaginosis are:   Bacterial vaginosis which results from an overgrowth of one on several organisms that are normally present in your vagina.   Yeast infections which are caused by a naturally occurring fungus called candida.   Vaginal atrophy (atrophic vaginosis) which results from the thinning of the vagina from reduced estrogen levels after menopause.   Trichomoniasis which is caused by a parasite and is commonly transmitted by sexual intercourse.  Factors that increase your risk of developing vaginosis include: Marland Kitchen Medications, such as antibiotics and steroids . Uncontrolled diabetes . Use of hygiene products such as bubble bath, vaginal spray or vaginal deodorant . Douching . Wearing damp or tight-fitting clothing . Using an intrauterine device (IUD) for birth control . Hormonal changes, such as those associated with pregnancy, birth control pills or menopause . Sexual activity . Having a sexually transmitted infection  Your treatment plan is Metronidazole or Flagyl 500mg  twice a day for 7 days.  I have electronically sent this prescription into the pharmacy that you have chosen.  Be sure to take all of the medication as directed. Stop taking any medication if you develop a rash, tongue swelling or shortness of breath. Mothers who are breast feeding should consider pumping and discarding their breast milk while on these antibiotics. However, there is no consensus that infant exposure at these doses would be harmful.  Remember that medication creams can  weaken latex condoms.  If you do not improve following this treatment,schedule an appointment with your GYN or PCP for an evaluation.    HOME CARE:  Good hygiene may prevent some types of vaginosis from recurring and may relieve some symptoms:  . Avoid baths, hot tubs and whirlpool spas. Rinse soap from your outer genital area after a shower, and dry the area well to prevent irritation. Don't use scented or harsh soaps, such as those with deodorant or antibacterial action. Marland Kitchen Avoid irritants. These include scented tampons and pads. . Wipe from front to back after using the toilet. Doing so avoids spreading fecal bacteria to your vagina.  Other things that may help prevent vaginosis include:  Marland Kitchen Don't douche. Your vagina doesn't require cleansing other than normal bathing. Repetitive douching disrupts the normal organisms that reside in the vagina and can actually increase your risk of vaginal infection. Douching won't clear up a vaginal infection. . Use a latex condom. Both female and female latex condoms may help you avoid infections spread by sexual contact. . Wear cotton underwear. Also wear pantyhose with a cotton crotch. If you feel comfortable without it, skip wearing underwear to bed. Yeast thrives in Marland Kitchen Your symptoms should improve in the next day or two.  GET HELP RIGHT AWAY IF:  . You have pain in your lower abdomen ( pelvic area or over your ovaries) . You develop nausea or vomiting . You develop a fever . Your discharge changes or worsens . You have persistent pain with intercourse . You develop shortness of breath, a rapid pulse, or you faint.  These symptoms could be signs of problems or infections that need to be evaluated by a medical provider now.  MAKE SURE YOU    Understand these instructions.  Will watch your condition.  Will get help right away if you are not doing well or get worse.  Your e-visit answers were reviewed by a board certified  advanced clinical practitioner to complete your personal care plan. Depending upon the condition, your plan could have included both over the counter or prescription medications. Please review your pharmacy choice to make sure that you have choses a pharmacy that is open for you to pick up any needed prescription, Your safety is important to [name]. If you have drug allergies check your prescription carefully.   You can use MyChart to ask questions about today's visit, request a non-urgent call back, or ask for a work or school excuse for 24 hours related to this e-Visit. If it has been greater than 24 hours you will need to follow up with your provider, or enter a new e-Visit to address those concerns. You will get a MyChart message within the next two days asking about your experience. I hope that your e-visit has been valuable and will speed your recovery.  Greater than 5 minutes, yet less than 10 minutes of time have been spent researching, coordinating and implementing care for this patient today.

## 2021-01-19 ENCOUNTER — Ambulatory Visit (HOSPITAL_COMMUNITY): Payer: Medicaid Other | Attending: Internal Medicine

## 2021-01-19 ENCOUNTER — Other Ambulatory Visit: Payer: Self-pay

## 2021-01-19 DIAGNOSIS — I491 Atrial premature depolarization: Secondary | ICD-10-CM | POA: Insufficient documentation

## 2021-01-19 DIAGNOSIS — R002 Palpitations: Secondary | ICD-10-CM | POA: Diagnosis present

## 2021-01-19 LAB — ECHOCARDIOGRAM COMPLETE
Area-P 1/2: 3.77 cm2
S' Lateral: 3.3 cm

## 2021-01-20 ENCOUNTER — Ambulatory Visit: Payer: Medicaid Other | Admitting: Nurse Practitioner

## 2021-02-03 ENCOUNTER — Ambulatory Visit: Payer: Medicaid Other | Admitting: Internal Medicine

## 2021-02-06 ENCOUNTER — Encounter: Payer: Self-pay | Admitting: Emergency Medicine

## 2021-02-06 ENCOUNTER — Ambulatory Visit
Admission: EM | Admit: 2021-02-06 | Discharge: 2021-02-06 | Disposition: A | Discharge status: Home / Self Care | Payer: Medicaid Other | Attending: Family Medicine | Admitting: Family Medicine

## 2021-02-06 ENCOUNTER — Other Ambulatory Visit: Payer: Self-pay

## 2021-02-06 DIAGNOSIS — N760 Acute vaginitis: Secondary | ICD-10-CM

## 2021-02-06 MED ORDER — FLUCONAZOLE 150 MG PO TABS: ORAL_TABLET | ORAL | 0 refills | Status: DC

## 2021-02-06 NOTE — ED Provider Notes (Addendum)
  [facility]   [id number] 02/06/21 Arrival Time: 0626  ASSESSMENT & PLAN:  1. Acute vaginitis     Begin: Meds ordered this encounter  Medications  . fluconazole (DIFLUCAN) 150 MG tablet    Sig: Take one tablet by mouth as a single dose. May repeat in 3 days if symptoms persist.    Dispense:  2 tablet    Refill:  0    Without s/s of PID. Cytology to r/o STI.  Pending: Labs Reviewed  CERVICOVAGINAL ANCILLARY ONLY   Will notify of any positive results. Instructed to refrain from sexual activity for at least seven days.  Reviewed expectations re: course of current medical issues. Questions answered. Outlined signs and symptoms indicating need for more acute intervention. Patient verbalized understanding. After Visit Summary given.   SUBJECTIVE:  Theresa Horne is a 30 y.o. female who presents with complaint of vaginal discharge. Onset gradual. First noticed this week. With vag itching. No specific aggravating or alleviating factors reported. Denies: urinary frequency, dysuria and gross hematuria. Afebrile. No abdominal or pelvic pain. Normal PO intake wihout n/v. No genital rashes or lesions. Reports that she is not sexually active. OTC treatment: Monistat without relief. H/O yeast infections. Minimal concern regarding STI.  Patient's last menstrual period was 01/19/2021.   OBJECTIVE:  Vitals:   02/06/21 0821  BP: 129/83  Pulse: 75  Resp: 18  Temp: 98.5 F (36.9 C)  TempSrc: Oral  SpO2: 96%     General appearance: alert, cooperative, appears stated age and no distress Lungs: unlabored respirations; speaks full sentences without difficulty Back: no CVA tenderness; FROM at waist Abdomen: soft, non-tender GU: deferred Skin: warm and dry Psychological: alert and cooperative; normal mood and affect.    Labs Reviewed  CERVICOVAGINAL ANCILLARY ONLY    No Known Allergies  Past Medical History:  Diagnosis Date  . Hypertension   . Preterm  labor    Family History  Problem Relation Age of Onset  . Hypertension Father   . Diabetes Paternal Uncle   . Renal Disease Maternal Grandmother    Social History   Socioeconomic History  . Marital status: Married    Spouse name: Not on file  . Number of children: Not on file  . Years of education: Not on file  . Highest education level: Not on file  Occupational History  . Not on file  Tobacco Use  . Smoking status: Current Every Day Smoker    Packs/day: 0.00    Types: Cigarettes    Last attempt to quit: 01/26/2015    Years since quitting: 6.0  . Smokeless tobacco: Never Used  Vaping Use  . Vaping Use: Never used  Substance and Sexual Activity  . Alcohol use: Yes  . Drug use: Yes    Types: Marijuana  . Sexual activity: Yes    Birth control/protection: None  Other Topics Concern  . Not on file  Social History Narrative  . Not on file   Social Determinants of Health   Financial Resource Strain: Not on file  Food Insecurity: Not on file  Transportation Needs: Not on file  Physical Activity: Not on file  Stress: Not on file  Social Connections: Not on file  Intimate Partner Violence: Not on file          Mardella Layman, MD 02/06/21 [id number]    Mardella Layman, MD 02/06/21 [phone]

## 2021-02-06 NOTE — ED Triage Notes (Signed)
Patient complains of a yeast infection.  Noticed symptoms on Monday.  Patient has used Radio producer.  Minimal relief

## 2021-02-07 ENCOUNTER — Telehealth: Payer: Medicaid Other | Admitting: Family

## 2021-02-07 DIAGNOSIS — A749 Chlamydial infection, unspecified: Secondary | ICD-10-CM | POA: Diagnosis not present

## 2021-02-07 LAB — CERVICOVAGINAL ANCILLARY ONLY
Bacterial Vaginitis (gardnerella): NEGATIVE
Candida Glabrata: NEGATIVE
Candida Vaginitis: NEGATIVE
Chlamydia: POSITIVE — AB
Comment: NEGATIVE
Comment: NEGATIVE
Comment: NEGATIVE
Comment: NEGATIVE
Comment: NEGATIVE
Comment: NORMAL
Neisseria Gonorrhea: NEGATIVE
Trichomonas: NEGATIVE

## 2021-02-07 MED ORDER — DOXYCYCLINE HYCLATE 100 MG PO TABS: 100.0000 mg | ORAL_TABLET | Freq: Two times a day (BID) | ORAL | 0 refills | Status: DC

## 2021-02-07 NOTE — Progress Notes (Signed)
We are sorry that you are not feeling well. Here is how we plan to help!  After reviewing your test results from the Urgent Care it looks like you did test positive for Chlamydia. I have sent in doxycyline 100 mg that you will take twice a day for 7 days.   Vaginosis is an inflammation of the vagina that can result in discharge, itching and pain. The cause is usually a change in the normal balance of vaginal bacteria or an infection. Vaginosis can also result from reduced estrogen levels after menopause.  The most common causes of vaginosis are:   Bacterial vaginosis which results from an overgrowth of one on several organisms that are normally present in your vagina.   Yeast infections which are caused by a naturally occurring fungus called candida.   Vaginal atrophy (atrophic vaginosis) which results from the thinning of the vagina from reduced estrogen levels after menopause.   Trichomoniasis which is caused by a parasite and is commonly transmitted by sexual intercourse.  Factors that increase your risk of developing vaginosis include: Marland Kitchen Medications, such as antibiotics and steroids . Uncontrolled diabetes . Use of hygiene products such as bubble bath, vaginal spray or vaginal deodorant . Douching . Wearing damp or tight-fitting clothing . Using an intrauterine device (IUD) for birth control . Hormonal changes, such as those associated with pregnancy, birth control pills or menopause . Sexual activity . Having a sexually transmitted infection   Be sure to take all of the medication as directed. Stop taking any medication if you develop a rash, tongue swelling or shortness of breath. Mothers who are breast feeding should consider pumping and discarding their breast milk while on these antibiotics. However, there is no consensus that infant exposure at these doses would be harmful.  Remember that medication creams can weaken latex condoms. Marland Kitchen   HOME CARE:  Good hygiene may prevent  some types of vaginosis from recurring and may relieve some symptoms:  . Avoid baths, hot tubs and whirlpool spas. Rinse soap from your outer genital area after a shower, and dry the area well to prevent irritation. Don't use scented or harsh soaps, such as those with deodorant or antibacterial action. Marland Kitchen Avoid irritants. These include scented tampons and pads. . Wipe from front to back after using the toilet. Doing so avoids spreading fecal bacteria to your vagina.  Other things that may help prevent vaginosis include:  Marland Kitchen Don't douche. Your vagina doesn't require cleansing other than normal bathing. Repetitive douching disrupts the normal organisms that reside in the vagina and can actually increase your risk of vaginal infection. Douching won't clear up a vaginal infection. . Use a latex condom. Both female and female latex condoms may help you avoid infections spread by sexual contact. . Wear cotton underwear. Also wear pantyhose with a cotton crotch. If you feel comfortable without it, skip wearing underwear to bed. Yeast thrives in [employer] Your symptoms should improve in the next day or two.  GET HELP RIGHT AWAY IF:  . You have pain in your lower abdomen ( pelvic area or over your ovaries) . You develop nausea or vomiting . You develop a fever . Your discharge changes or worsens . You have persistent pain with intercourse . You develop shortness of breath, a rapid pulse, or you faint.  These symptoms could be signs of problems or infections that need to be evaluated by a medical provider now.  MAKE SURE YOU    Understand these instructions.  Will watch your condition.  Will get help right away if you are not doing well or get worse.  Your e-visit answers were reviewed by a board certified advanced clinical practitioner to complete your personal care plan. Depending upon the condition, your plan could have included both over the counter or prescription  medications. Please review your pharmacy choice to make sure that you have choses a pharmacy that is open for you to pick up any needed prescription, Your safety is important to [name]. If you have drug allergies check your prescription carefully.   You can use MyChart to ask questions about today's visit, request a non-urgent call back, or ask for a work or school excuse for 24 hours related to this e-Visit. If it has been greater than 24 hours you will need to follow up with your provider, or enter a new e-Visit to address those concerns. You will get a MyChart message within the next two days asking about your experience. I hope that your e-visit has been valuable and will speed your recovery.   Approximately 5 minutes was spent documenting and reviewing patient's chart.

## 2021-05-09 ENCOUNTER — Telehealth: Payer: Medicaid Other | Admitting: Nurse Practitioner

## 2021-05-09 DIAGNOSIS — N898 Other specified noninflammatory disorders of vagina: Secondary | ICD-10-CM

## 2021-05-09 NOTE — Progress Notes (Signed)
We are sorry that you are not feeling well. Here is how we plan to help! Based on what you shared with me it looks like you: May have a yeast vaginosis  Vaginosis is an inflammation of the vagina that can result in discharge, itching and pain. The cause is usually a change in the normal balance of vaginal bacteria or an infection. Vaginosis can also result from reduced estrogen levels after menopause.  The most common causes of vaginosis are:   Bacterial vaginosis which results from an overgrowth of one on several organisms that are normally present in your vagina.   Yeast infections which are caused by a naturally occurring fungus called candida.   Vaginal atrophy (atrophic vaginosis) which results from the thinning of the vagina from reduced estrogen levels after menopause.   Trichomoniasis which is caused by a parasite and is commonly transmitted by sexual intercourse.  Factors that increase your risk of developing vaginosis include: Medications, such as antibiotics and steroids Uncontrolled diabetes Use of hygiene products such as bubble bath, vaginal spray or vaginal deodorant Douching Wearing damp or tight-fitting clothing Using an intrauterine device (IUD) for birth control Hormonal changes, such as those associated with pregnancy, birth control pills or menopause Sexual activity Having a sexually transmitted infection  Your treatment plan is Monistat (miconazole) or Gyne-Lotrimin (clotrimazole) over the counter at most pharmacies. I recommend trying this OTC medication to see if your symptoms improve before taking an oral medication with your pregnancy. If your symptoms do not improve, I recommend following up with your OB/GYN.  Be sure to take all of the medication as directed. Stop taking any medication if you develop a rash, tongue swelling or shortness of breath. Mothers who are breast feeding should consider pumping and discarding their breast milk while on these antibiotics.  However, there is no consensus that infant exposure at these doses would be harmful.  Remember that medication creams can weaken latex condoms. Marland Kitchen   HOME CARE:  Good hygiene may prevent some types of vaginosis from recurring and may relieve some symptoms:  Avoid baths, hot tubs and whirlpool spas. Rinse soap from your outer genital area after a shower, and dry the area well to prevent irritation. Don't use scented or harsh soaps, such as those with deodorant or antibacterial action. Avoid irritants. These include scented tampons and pads. Wipe from front to back after using the toilet. Doing so avoids spreading fecal bacteria to your vagina.  Other things that may help prevent vaginosis include:  Don't douche. Your vagina doesn't require cleansing other than normal bathing. Repetitive douching disrupts the normal organisms that reside in the vagina and can actually increase your risk of vaginal infection. Douching won't clear up a vaginal infection. Use a latex condom. Both female and female latex condoms may help you avoid infections spread by sexual contact. Wear cotton underwear. Also wear pantyhose with a cotton crotch. If you feel comfortable without it, skip wearing underwear to bed. Yeast thrives in [employer] Your symptoms should improve in the next day or two.  GET HELP RIGHT AWAY IF:  You have pain in your lower abdomen ( pelvic area or over your ovaries) You develop nausea or vomiting You develop a fever Your discharge changes or worsens You have persistent pain with intercourse You develop shortness of breath, a rapid pulse, or you faint.  These symptoms could be signs of problems or infections that need to be evaluated by a medical provider now.  MAKE SURE YOU  Understand these instructions. Will watch your condition. Will get help right away if you are not doing well or get worse.  Your e-visit answers were reviewed by a board certified advanced clinical  practitioner to complete your personal care plan. Depending upon the condition, your plan could have included both over the counter or prescription medications. Please review your pharmacy choice to make sure that you have choses a pharmacy that is open for you to pick up any needed prescription, Your safety is important to [name]. If you have drug allergies check your prescription carefully.   You can use MyChart to ask questions about today's visit, request a non-urgent call back, or ask for a work or school excuse for 24 hours related to this e-Visit. If it has been greater than 24 hours you will need to follow up with your provider, or enter a new e-Visit to address those concerns. You will get a MyChart message within the next two days asking about your experience. I hope that your e-visit has been valuable and will speed your recovery.   I have spent at least 5 minutes reviewing and documenting in the patient's chart.

## 2021-05-12 ENCOUNTER — Other Ambulatory Visit: Payer: Self-pay

## 2021-05-12 ENCOUNTER — Ambulatory Visit
Admission: EM | Admit: 2021-05-12 | Discharge: 2021-05-12 | Disposition: A | Discharge status: Home / Self Care | Payer: Medicaid Other | Attending: Emergency Medicine | Admitting: Emergency Medicine

## 2021-05-12 DIAGNOSIS — N760 Acute vaginitis: Secondary | ICD-10-CM | POA: Diagnosis present

## 2021-05-12 LAB — POCT URINE PREGNANCY: Preg Test, Ur: POSITIVE — AB

## 2021-05-12 MED ORDER — CLOTRIMAZOLE 1 % VA CREA: 1.0000 | TOPICAL_CREAM | Freq: Every day | VAGINAL | 0 refills | Status: AC

## 2021-05-12 NOTE — ED Triage Notes (Signed)
Pt c/o vaginal itching with a white discharge since Sunday. States has had unprotective intercourse. Denies any urinary sx's. States used Monistat with no relief.

## 2021-05-12 NOTE — Discharge Instructions (Addendum)
Please set up follow-up appointment with OB/GYN Begin clotrimazole vaginally x7 days Follow-up if not improving or worsening

## 2021-05-12 NOTE — ED Provider Notes (Signed)
[name]    CSN: [id number] Arrival date & time: 05/12/21  3846      History   Chief Complaint Chief Complaint  Patient presents with   Vaginal Itching    HPI Theresa Horne is a 30 y.o. female approximately [redacted] weeks pregnant history of hypertension presenting today for evaluation of possible yeast infection.  Reports discharge and itching for the past few days.  Similar to past yeast infections.  Using over-the-counter Monistat without relief.  Denies any abdominal pain or bleeding.  Reports that she has not had initial prenatal with OB/GYN yet.  HPI  Past Medical History:  Diagnosis Date   Hypertension    Preterm labor     Patient Active Problem List   Diagnosis Date Noted   Normal labor and delivery 04/27/2019   Anemia 10/04/2015   Abdominal pressure 06/09/2015   History of preterm delivery, currently pregnant -- delivered at 25 wks in March 2012 06/09/2015   Hydradenitis 04/16/2012    Past Surgical History:  Procedure Laterality Date   INDUCED ABORTION      OB History     Gravida  5   Para  3   Term  2   Preterm  1   AB  1   Living  3      SAB      IAB  1   Ectopic      Multiple  0   Live Births  3            Home Medications    Prior to Admission medications   Medication Sig Start Date End Date Taking? Authorizing Provider  clotrimazole (GYNE-LOTRIMIN) 1 % vaginal cream Place 1 Applicatorful vaginally at bedtime for 7 days. 05/12/21 05/19/21 Yes Jayona Mccaig C, PA-C  metoprolol succinate (TOPROL-XL) 25 MG 24 hr tablet Take 1 tablet (25 mg total) by mouth daily. 01/04/21 02/06/21  Parke Poisson, MD    Family History Family History  Problem Relation Age of Onset   Hypertension Father    Diabetes Paternal Uncle    Renal Disease Maternal Grandmother     Social History Social History   Tobacco Use   Smoking status: Every Day    Packs/day: 0.00    Pack years: 0.00    Types: Cigarettes    Last  attempt to quit: 01/26/2015    Years since quitting: 6.2   Smokeless tobacco: Never  Vaping Use   Vaping Use: Never used  Substance Use Topics   Alcohol use: Yes   Drug use: Yes    Types: Marijuana     Allergies   Patient has no known allergies.   Review of Systems Review of Systems  Constitutional:  Negative for fever.  Respiratory:  Negative for shortness of breath.   Cardiovascular:  Negative for chest pain.  Gastrointestinal:  Negative for abdominal pain, diarrhea, nausea and vomiting.  Genitourinary:  Positive for vaginal discharge. Negative for dysuria, flank pain, genital sores, hematuria, menstrual problem, vaginal bleeding and vaginal pain.  Musculoskeletal:  Negative for back pain.  Skin:  Negative for rash.  Neurological:  Negative for dizziness, light-headedness and headaches.    Physical Exam Triage Vital Signs ED Triage Vitals  Enc Vitals Group     BP      Pulse      Resp      Temp      Temp src      SpO2      Weight  Height      Head Circumference      Peak Flow      Pain Score      Pain Loc      Pain Edu?      Excl. in GC?    No data found.  Updated Vital Signs BP (!) 150/92 (BP Location: Left Arm)   Pulse 80   Temp 98.4 F (36.9 C) (Oral)   Resp 18   LMP 03/29/2021   SpO2 98%   Visual Acuity Right Eye Distance:   Left Eye Distance:   Bilateral Distance:    Right Eye Near:   Left Eye Near:    Bilateral Near:     Physical Exam Vitals and nursing note reviewed.  Constitutional:      Appearance: She is well-developed.     Comments: No acute distress  HENT:     Head: Normocephalic and atraumatic.     Nose: Nose normal.  Eyes:     Conjunctiva/sclera: Conjunctivae normal.  Cardiovascular:     Rate and Rhythm: Normal rate.  Pulmonary:     Effort: Pulmonary effort is normal. No respiratory distress.  Abdominal:     General: There is no distension.  Musculoskeletal:        General: Normal range of motion.     Cervical  back: Neck supple.  Skin:    General: Skin is warm and dry.  Neurological:     Mental Status: She is alert and oriented to person, place, and time.     UC Treatments / Results  Labs (all labs ordered are listed, but only abnormal results are displayed) Labs Reviewed  POCT URINE PREGNANCY - Abnormal; Notable for the following components:      Result Value   Preg Test, Ur Positive (*)    All other components within normal limits  CERVICOVAGINAL ANCILLARY ONLY    EKG   Radiology No results found.  Procedures Procedures (including critical care time)  Medications Ordered in UC Medications - No data to display  Initial Impression / Assessment and Plan / UC Course  I have reviewed the triage vital signs and the nursing notes.  Pertinent labs & imaging results that were available during my care of the patient were reviewed by me and considered in my medical decision making (see chart for details).     40-week-year-old pregnant female likely with yeast, treating with topical clotrimazole, vaginal swab pending for confirmation.  Will alter therapy as needed based off results.  Discussed strict return precautions. Patient verbalized understanding and is agreeable with plan.  Final Clinical Impressions(s) / UC Diagnoses   Final diagnoses:  Vaginitis and vulvovaginitis     Discharge Instructions      Please set up follow-up appointment with OB/GYN Begin clotrimazole vaginally x7 days Follow-up if not improving or worsening     ED Prescriptions     Medication Sig Dispense Auth. Provider   clotrimazole (GYNE-LOTRIMIN) 1 % vaginal cream Place 1 Applicatorful vaginally at bedtime for 7 days. 45 g Kaulin Chaves, [place] C, PA-C      PDMP not reviewed this encounter.   Lew Dawes, [place] 05/12/21 [phone]

## 2021-05-13 LAB — CERVICOVAGINAL ANCILLARY ONLY
Bacterial Vaginitis (gardnerella): NEGATIVE
Candida Glabrata: NEGATIVE
Candida Vaginitis: POSITIVE — AB
Chlamydia: NEGATIVE
Comment: NEGATIVE
Comment: NEGATIVE
Comment: NEGATIVE
Comment: NEGATIVE
Comment: NEGATIVE
Comment: NORMAL
Neisseria Gonorrhea: NEGATIVE
Trichomonas: NEGATIVE

## 2021-06-05 DIAGNOSIS — Z3490 Encounter for supervision of normal pregnancy, unspecified, unspecified trimester: Secondary | ICD-10-CM | POA: Insufficient documentation

## 2021-06-08 DIAGNOSIS — O021 Missed abortion: Secondary | ICD-10-CM | POA: Insufficient documentation

## 2021-09-16 ENCOUNTER — Emergency Department (HOSPITAL_BASED_OUTPATIENT_CLINIC_OR_DEPARTMENT_OTHER)
Admission: EM | Admit: 2021-09-16 | Discharge: 2021-09-16 | Disposition: A | Discharge status: Home / Self Care | Payer: Medicaid Other | Attending: Emergency Medicine | Admitting: Emergency Medicine

## 2021-09-16 ENCOUNTER — Ambulatory Visit
Admission: EM | Admit: 2021-09-16 | Discharge: 2021-09-16 | Disposition: A | Discharge status: Home / Self Care | Payer: Medicaid Other | Attending: Physician Assistant | Admitting: Physician Assistant

## 2021-09-16 ENCOUNTER — Encounter (HOSPITAL_BASED_OUTPATIENT_CLINIC_OR_DEPARTMENT_OTHER): Payer: Self-pay | Admitting: Emergency Medicine

## 2021-09-16 ENCOUNTER — Other Ambulatory Visit: Payer: Self-pay

## 2021-09-16 DIAGNOSIS — F17210 Nicotine dependence, cigarettes, uncomplicated: Secondary | ICD-10-CM | POA: Insufficient documentation

## 2021-09-16 DIAGNOSIS — I10 Essential (primary) hypertension: Secondary | ICD-10-CM | POA: Insufficient documentation

## 2021-09-16 DIAGNOSIS — R1011 Right upper quadrant pain: Secondary | ICD-10-CM

## 2021-09-16 DIAGNOSIS — N3000 Acute cystitis without hematuria: Secondary | ICD-10-CM | POA: Diagnosis not present

## 2021-09-16 LAB — COMPREHENSIVE METABOLIC PANEL
ALT: 8 U/L (ref 0–44)
AST: 12 U/L — ABNORMAL LOW (ref 15–41)
Albumin: 4.3 g/dL (ref 3.5–5.0)
Alkaline Phosphatase: 47 U/L (ref 38–126)
Anion gap: 10 (ref 5–15)
BUN: 11 mg/dL (ref 6–20)
CO2: 25 mmol/L (ref 22–32)
Calcium: 9.5 mg/dL (ref 8.9–10.3)
Chloride: 105 mmol/L (ref 98–111)
Creatinine, Ser: 0.86 mg/dL (ref 0.44–1.00)
GFR, Estimated: >60 mL/min (ref >60)
Glucose, Bld: 84 mg/dL (ref 70–99)
Potassium: 3.4 mmol/L — ABNORMAL LOW (ref 3.5–5.1)
Sodium: 140 mmol/L (ref 135–145)
Total Bilirubin: 0.4 mg/dL (ref 0.3–1.2)
Total Protein: 7.6 g/dL (ref 6.5–8.1)

## 2021-09-16 LAB — CBC WITH DIFFERENTIAL/PLATELET
Abs Immature Granulocytes: 0.02 10*3/uL (ref 0.00–0.07)
Basophils Absolute: 0.1 10*3/uL (ref 0.0–0.1)
Basophils Relative: 1 %
Eosinophils Absolute: 0.2 10*3/uL (ref 0.0–0.5)
Eosinophils Relative: 2 %
HCT: 34.4 % — ABNORMAL LOW (ref 36.0–46.0)
Hemoglobin: 10.8 g/dL — ABNORMAL LOW (ref 12.0–15.0)
Immature Granulocytes: 0 %
Lymphocytes Relative: 23 %
Lymphs Abs: 1.6 10*3/uL (ref 0.7–4.0)
MCH: 26.8 pg (ref 26.0–34.0)
MCHC: 31.4 g/dL (ref 30.0–36.0)
MCV: 85.4 fL (ref 80.0–100.0)
Monocytes Absolute: 0.5 10*3/uL (ref 0.1–1.0)
Monocytes Relative: 8 %
Neutro Abs: 4.6 10*3/uL (ref 1.7–7.7)
Neutrophils Relative %: 66 %
Platelets: 171 10*3/uL (ref 150–400)
RBC: 4.03 MIL/uL (ref 3.87–5.11)
RDW: 14.1 % (ref 11.5–15.5)
WBC: 7 10*3/uL (ref 4.0–10.5)
nRBC: 0 % (ref 0.0–0.2)

## 2021-09-16 LAB — URINALYSIS, ROUTINE W REFLEX MICROSCOPIC
Bilirubin Urine: NEGATIVE
Glucose, UA: NEGATIVE mg/dL
Hgb urine dipstick: NEGATIVE
Ketones, ur: 15 mg/dL — AB
Nitrite: NEGATIVE
Protein, ur: 30 mg/dL — AB
Specific Gravity, Urine: 1.036 — ABNORMAL HIGH (ref 1.005–1.030)
pH: 6 (ref 5.0–8.0)

## 2021-09-16 LAB — POCT URINE PREGNANCY: Preg Test, Ur: NEGATIVE

## 2021-09-16 MED ORDER — CEPHALEXIN 500 MG PO CAPS: 500.0000 mg | ORAL_CAPSULE | Freq: Four times a day (QID) | ORAL | 0 refills | Status: DC

## 2021-09-16 NOTE — ED Provider Notes (Signed)
[facility] EMERGENCY DEPT Provider Note   CSN: [id number] Arrival date & time: 09/16/21  1631     History Chief Complaint  Patient presents with   Abdominal Pain    Theresa Horne is a 30 y.o. female who presents with abdominal pain x 4 days. Patient was seen at [facility] care today and told to be evaluated in ED if patient did not improve. She states her pain is dull cramp worse in RUQ and lower abdomen. She has not tried anything for her symptoms. No urinary symptoms or pelvic pain. No concern for STDs.    Abdominal Pain Associated symptoms: no chest pain, no chills, no constipation, no diarrhea, no dysuria, no fever, no nausea, no shortness of breath, no vaginal bleeding, no vaginal discharge and no vomiting       Past Medical History:  Diagnosis Date   Hypertension    Preterm labor     Patient Active Problem List   Diagnosis Date Noted   Normal labor and delivery 04/27/2019   Anemia 10/04/2015   Abdominal pressure 06/09/2015   History of preterm delivery, currently pregnant -- delivered at 25 wks in March 2012 06/09/2015   Hydradenitis 04/16/2012    Past Surgical History:  Procedure Laterality Date   INDUCED ABORTION       OB History     Gravida  5   Para  3   Term  2   Preterm  1   AB  1   Living  3      SAB      IAB  1   Ectopic      Multiple  0   Live Births  3           Family History  Problem Relation Age of Onset   Hypertension Father    Diabetes Paternal Uncle    Renal Disease Maternal Grandmother     Social History   Tobacco Use   Smoking status: Every Day    Packs/day: 0.00    Types: Cigarettes    Last attempt to quit: 01/26/2015    Years since quitting: 6.6   Smokeless tobacco: Never  Vaping Use   Vaping Use: Never used  Substance Use Topics   Alcohol use: Yes   Drug use: Yes    Types: Marijuana    Home Medications Prior to Admission medications   Medication Sig Start Date End Date Taking?  Authorizing Provider  cephALEXin (KEFLEX) 500 MG capsule Take 1 capsule (500 mg total) by mouth 4 (four) times daily. 09/16/21  Yes Hadi Dubin T, PA-C  metoprolol succinate (TOPROL-XL) 25 MG 24 hr tablet Take 1 tablet (25 mg total) by mouth daily. 01/04/21 02/06/21  Parke Poisson, MD    Allergies    Patient has no known allergies.  Review of Systems   Review of Systems  Constitutional:  Negative for chills and fever.  Respiratory:  Negative for shortness of breath.   Cardiovascular:  Negative for chest pain.  Gastrointestinal:  Positive for abdominal pain. Negative for constipation, diarrhea, nausea and vomiting.  Genitourinary:  Negative for dysuria, flank pain, frequency, pelvic pain, urgency, vaginal bleeding, vaginal discharge and vaginal pain.  All other systems reviewed and are negative.  Physical Exam Updated Vital Signs BP (!) 166/78 (BP Location: Right Arm)   Pulse 80   Temp 98.2 F (36.8 C) (Oral)   Resp 16   Ht 5\' 4"  (1.626 m)   Wt 92.5 kg  LMP 08/30/2021 (Approximate)   SpO2 100%   BMI 35.02 kg/m   Physical Exam Vitals and nursing note reviewed.  Constitutional:      Appearance: Normal appearance.  HENT:     Head: Normocephalic and atraumatic.  Eyes:     Conjunctiva/sclera: Conjunctivae normal.  Cardiovascular:     Rate and Rhythm: Normal rate and regular rhythm.  Pulmonary:     Effort: Pulmonary effort is normal. No respiratory distress.     Breath sounds: Normal breath sounds.  Abdominal:     General: There is no distension.     Palpations: Abdomen is soft.     Tenderness: There is abdominal tenderness in the suprapubic area. There is no right CVA tenderness, left CVA tenderness, guarding or rebound.  Skin:    General: Skin is warm and dry.  Neurological:     General: No focal deficit present.     Mental Status: She is alert.    ED Results / Procedures / Treatments   Labs (all labs ordered are listed, but only abnormal results are  displayed) Labs Reviewed  CBC WITH DIFFERENTIAL/PLATELET - Abnormal; Notable for the following components:      Result Value   Hemoglobin 10.8 (*)    HCT 34.4 (*)    All other components within normal limits  COMPREHENSIVE METABOLIC PANEL - Abnormal; Notable for the following components:   Potassium 3.4 (*)    AST 12 (*)    All other components within normal limits  URINALYSIS, ROUTINE W REFLEX MICROSCOPIC - Abnormal; Notable for the following components:   APPearance HAZY (*)    Specific Gravity, Urine 1.036 (*)    Ketones, ur 15 (*)    Protein, ur 30 (*)    Leukocytes,Ua MODERATE (*)    Bacteria, UA FEW (*)    All other components within normal limits    EKG None  Radiology No results found.  Procedures Procedures   Medications Ordered in ED Medications - No data to display  ED Course  I have reviewed the triage vital signs and the nursing notes.  Pertinent labs & imaging results that were available during my care of the patient were reviewed by me and considered in my medical decision making (see chart for details).    MDM Rules/Calculators/A&P                           Patient is otherwise healthy 30 y/o female who presents to the ED for abdominal pain x 4 days. Pain came on gradually and is worsening. No fevers, chills, nausea, vomiting, dysuria, hematuria, diarrhea, or constipation. No aggravating or alleviating factors. No concern for STD, no discharge or pelvic pain.   On exam patient is afebrile, she is not toxic appearing. Abdomen soft, with suprapubic tenderness. No guarding or rigidity. CBC with no leukocytosis. CMP unremarkable. Urinalysis positive for UTI. Had negative pregnancy test at urgent care earlier today, not repeated. Low concern for ectopic pregnancy. Normal bowel movements, not likely bowel obstruction. No peritoneal signs, which decreases my suspicion for acute abdominal infection such as appendicitis, cholecystitis, diverticulitis. Pattern of  pain is more gradual, I have low suspicion for ovarian torsion.   On reevaluation abdomen remains non-surgical. She is not requiring admission or inpatient treatment for her symptoms at this time. Plan to discharge home with antibiotics for acute cystitis. Discussed close follow up with PCP. Discussed reasons to return to ED. Patient agreeable to plan.  Final Clinical Impression(s) / ED Diagnoses Final diagnoses:  Acute cystitis without hematuria    Rx / DC Orders ED Discharge Orders          Ordered    cephALEXin (KEFLEX) 500 MG capsule  4 times daily,   Status:  Discontinued        09/16/21 1920    cephALEXin (KEFLEX) 500 MG capsule  4 times daily        09/16/21 2004             Jeanella Flattery 09/16/21 2027    Ernie Avena, MD 09/16/21 2339

## 2021-09-16 NOTE — ED Triage Notes (Signed)
Pt c/o abdominal pain onset Monday. The pain is in the RUQ and unbiblical area. States it "feels like gas I can't release but I haven't had any trouble using the bathroom." LBM yesterday. Sharp achy pain comes and goes.   Denies diarrhea constipation, headache fever nausea vomiting.

## 2021-09-16 NOTE — ED Provider Notes (Signed)
[name]    CSN: [id number] Arrival date & time: 09/16/21  0815      History   Chief Complaint Chief Complaint  Patient presents with   Abdominal Pain    HPI Theresa Horne is a 30 y.o. female.   Patient here today for evaluation of right upper quadrant pain and lower abdominal pain that has been ongoing for the last 4 days.  She reports pain will be more like a pressure and she will feel as if she needs to pass gas but after doing so symptoms do not improve.  She denies any diarrhea, constipation, nausea, or vomiting.  She denies any thing makes symptoms better or worse.  She has not had fever.  Past medical history is significant for miscarriage 2 months ago.  Last menstrual period was the end of last month.  The history is provided by the patient.  Abdominal Pain Associated symptoms: no chills, no constipation, no diarrhea, no fever, no nausea, no shortness of breath and no vomiting    Past Medical History:  Diagnosis Date   Hypertension    Preterm labor     Patient Active Problem List   Diagnosis Date Noted   Normal labor and delivery 04/27/2019   Anemia 10/04/2015   Abdominal pressure 06/09/2015   History of preterm delivery, currently pregnant -- delivered at 25 wks in March 2012 06/09/2015   Hydradenitis 04/16/2012    Past Surgical History:  Procedure Laterality Date   INDUCED ABORTION      OB History     Gravida  5   Para  3   Term  2   Preterm  1   AB  1   Living  3      SAB      IAB  1   Ectopic      Multiple  0   Live Births  3            Home Medications    Prior to Admission medications   Medication Sig Start Date End Date Taking? Authorizing Provider  metoprolol succinate (TOPROL-XL) 25 MG 24 hr tablet Take 1 tablet (25 mg total) by mouth daily. 01/04/21 02/06/21  Parke Poisson, MD    Family History Family History  Problem Relation Age of Onset   Hypertension Father    Diabetes Paternal Uncle     Renal Disease Maternal Grandmother     Social History Social History   Tobacco Use   Smoking status: Every Day    Packs/day: 0.00    Types: Cigarettes    Last attempt to quit: 01/26/2015    Years since quitting: 6.6   Smokeless tobacco: Never  Vaping Use   Vaping Use: Never used  Substance Use Topics   Alcohol use: Yes   Drug use: Yes    Types: Marijuana     Allergies   Patient has no known allergies.   Review of Systems Review of Systems  Constitutional:  Negative for chills and fever.  HENT:  Negative for congestion.   Eyes:  Negative for discharge and redness.  Respiratory:  Negative for shortness of breath.   Gastrointestinal:  Positive for abdominal pain. Negative for constipation, diarrhea, nausea and vomiting.    Physical Exam Triage Vital Signs ED Triage Vitals [09/16/21 0824]  Enc Vitals Group     BP (!) 150/84     Pulse Rate 71     Resp 18     Temp 98.3  F (36.8 C)     Temp Source Oral     SpO2 98 %     Weight      Height      Head Circumference      Peak Flow      Pain Score 1     Pain Loc      Pain Edu?      Excl. in [place]?    No data found.  Updated Vital Signs BP (!) 150/84 (BP Location: Left Arm)   Pulse 71   Temp 98.3 F (36.8 C) (Oral)   Resp 18   LMP 08/22/2021 (Approximate)   SpO2 98%   Breastfeeding No     Physical Exam Vitals and nursing note reviewed.  Constitutional:      General: She is not in acute distress.    Appearance: Normal appearance. She is not ill-appearing.  HENT:     Head: Normocephalic and atraumatic.     Nose: Nose normal.  Cardiovascular:     Rate and Rhythm: Normal rate and regular rhythm.     Heart sounds: Normal heart sounds. No murmur heard. Pulmonary:     Effort: Pulmonary effort is normal. No respiratory distress.     Breath sounds: Normal breath sounds. No wheezing, rhonchi or rales.  Abdominal:     General: Abdomen is flat. Bowel sounds are normal. There is no distension.     Palpations:  Abdomen is soft.     Tenderness: There is abdominal tenderness (mild TTP to RUQ and Lower abdomen diffusely). There is no guarding.  Skin:    General: Skin is warm and dry.  Neurological:     Mental Status: She is alert.  Psychiatric:        Mood and Affect: Mood normal.        Thought Content: Thought content normal.     UC Treatments / Results  Labs (all labs ordered are listed, but only abnormal results are displayed) Labs Reviewed  CBC WITH DIFFERENTIAL/PLATELET  COMPREHENSIVE METABOLIC PANEL  POCT URINE PREGNANCY    EKG   Radiology No results found.  Procedures Procedures (including critical care time)  Medications Ordered in UC Medications - No data to display  Initial Impression / Assessment and Plan / UC Course  I have reviewed the triage vital signs and the nursing notes.  Pertinent labs & imaging results that were available during my care of the patient were reviewed by me and considered in my medical decision making (see chart for details).  Unknown etiology of symptoms but did discuss possible gallbladder etiology versus menstrual type symptoms as she should be due to start her menstrual period soon.  Will order labs for further evaluation but recommended follow-up in ED if symptoms worsen anyway.  Final Clinical Impressions(s) / UC Diagnoses   Final diagnoses:  Right upper quadrant abdominal pain   Discharge Instructions   None    ED Prescriptions   None    PDMP not reviewed this encounter.   Tomi Bamberger, PA-C 09/16/21 0900

## 2021-09-16 NOTE — Discharge Instructions (Addendum)
You were seen in the emergency department today for abdominal pain.  As we discussed your urine showed you have a urinary tract infection. We treat this with antibiotics, you will take this four times daily for the next 5 days.  Please use Tylenol or ibuprofen for pain.  You may use 600 mg ibuprofen every 6 hours or 1000 mg of Tylenol every 6 hours.  You may choose to alternate between the 2.  This would be most effective.  Not to exceed 4 g of Tylenol within 24 hours.  Not to exceed 3200 mg ibuprofen 24 hours.   Continue to monitor how you're doing and return to the ER for new or worsening symptoms such as fevers or pelvic pain.   It has been a pleasure seeing and caring for you today and I hope you start feeling better soon!

## 2021-09-16 NOTE — ED Triage Notes (Signed)
Reports lower abdominal pain that started Monday.  Seen at [facility] today.  Was told to come to the ED if no better.

## 2021-09-17 LAB — CBC WITH DIFFERENTIAL/PLATELET
Basophils Absolute: 0.1 10*3/uL (ref 0.0–0.2)
Basos: 1 %
EOS (ABSOLUTE): 0.1 10*3/uL (ref 0.0–0.4)
Eos: 2 %
Hematocrit: 33.9 % — ABNORMAL LOW (ref 34.0–46.6)
Hemoglobin: 10.9 g/dL — ABNORMAL LOW (ref 11.1–15.9)
Immature Grans (Abs): 0 10*3/uL (ref 0.0–0.1)
Immature Granulocytes: 0 %
Lymphocytes Absolute: 1 10*3/uL (ref 0.7–3.1)
Lymphs: 18 %
MCH: 27.7 pg (ref 26.6–33.0)
MCHC: 32.2 g/dL (ref 31.5–35.7)
MCV: 86 fL (ref 79–97)
Monocytes Absolute: 0.4 10*3/uL (ref 0.1–0.9)
Monocytes: 7 %
Neutrophils Absolute: 3.8 10*3/uL (ref 1.4–7.0)
Neutrophils: 72 %
Platelets: 166 10*3/uL (ref 150–450)
RBC: 3.93 x10E6/uL (ref 3.77–5.28)
RDW: 13.6 % (ref 11.7–15.4)
WBC: 5.2 10*3/uL (ref 3.4–10.8)

## 2021-09-17 LAB — COMPREHENSIVE METABOLIC PANEL
ALT: 7 IU/L (ref 0–32)
AST: 13 IU/L (ref 0–40)
Albumin/Globulin Ratio: 1.6 (ref 1.2–2.2)
Albumin: 4.5 g/dL (ref 3.9–5.0)
Alkaline Phosphatase: 59 IU/L (ref 44–121)
BUN/Creatinine Ratio: 12 (ref 9–23)
BUN: 11 mg/dL (ref 6–20)
Bilirubin Total: <0.2 mg/dL (ref 0.0–1.2)
CO2: 21 mmol/L (ref 20–29)
Calcium: 9.1 mg/dL (ref 8.7–10.2)
Chloride: 105 mmol/L (ref 96–106)
Creatinine, Ser: 0.9 mg/dL (ref 0.57–1.00)
Globulin, Total: 2.8 g/dL (ref 1.5–4.5)
Glucose: 88 mg/dL (ref 70–99)
Potassium: 3.8 mmol/L (ref 3.5–5.2)
Sodium: 140 mmol/L (ref 134–144)
Total Protein: 7.3 g/dL (ref 6.0–8.5)
eGFR: 89 mL/min/{1.73_m2} (ref >59)

## 2021-10-22 ENCOUNTER — Other Ambulatory Visit: Payer: Self-pay

## 2021-10-22 ENCOUNTER — Encounter: Payer: Self-pay | Admitting: Emergency Medicine

## 2021-10-22 ENCOUNTER — Ambulatory Visit
Admission: EM | Admit: 2021-10-22 | Discharge: 2021-10-22 | Disposition: A | Discharge status: Home / Self Care | Payer: Medicaid Other | Attending: Physician Assistant | Admitting: Physician Assistant

## 2021-10-22 DIAGNOSIS — J111 Influenza due to unidentified influenza virus with other respiratory manifestations: Secondary | ICD-10-CM | POA: Diagnosis not present

## 2021-10-22 LAB — POCT INFLUENZA A/B
Influenza A, POC: POSITIVE — AB
Influenza B, POC: NEGATIVE

## 2021-10-22 LAB — POCT URINE PREGNANCY: Preg Test, Ur: NEGATIVE

## 2021-10-22 MED ORDER — OSELTAMIVIR PHOSPHATE 75 MG PO CAPS: 75.0000 mg | ORAL_CAPSULE | Freq: Two times a day (BID) | ORAL | 0 refills | Status: DC

## 2021-10-22 NOTE — ED Provider Notes (Signed)
[name]    CSN: [id number] Arrival date & time: 10/22/21  6213      History   Chief Complaint No chief complaint on file.   HPI Theresa Horne is a 30 y.o. female.   Patient here today for evaluation of cough, congestion, sore throat, body aches and chills for 2 days. Multiple members of the family have similar symptoms. No nausea, vomiting or diarrhea. She has tried taking OTC medication without significant relief.   The history is provided by the patient.   Past Medical History:  Diagnosis Date   Hypertension    Preterm labor     Patient Active Problem List   Diagnosis Date Noted   Normal labor and delivery 04/27/2019   Anemia 10/04/2015   Abdominal pressure 06/09/2015   History of preterm delivery, currently pregnant -- delivered at 25 wks in March 2012 06/09/2015   Hydradenitis 04/16/2012    Past Surgical History:  Procedure Laterality Date   INDUCED ABORTION      OB History     Gravida  5   Para  3   Term  2   Preterm  1   AB  1   Living  3      SAB      IAB  1   Ectopic      Multiple  0   Live Births  3            Home Medications    Prior to Admission medications   Medication Sig Start Date End Date Taking? Authorizing Provider  oseltamivir (TAMIFLU) 75 MG capsule Take 1 capsule (75 mg total) by mouth every 12 (twelve) hours. 10/22/21  Yes Tomi Bamberger, PA-C  cephALEXin (KEFLEX) 500 MG capsule Take 1 capsule (500 mg total) by mouth 4 (four) times daily. 09/16/21   Roemhildt, Lorin T, PA-C  metoprolol succinate (TOPROL-XL) 25 MG 24 hr tablet Take 1 tablet (25 mg total) by mouth daily. 01/04/21 02/06/21  Parke Poisson, MD    Family History Family History  Problem Relation Age of Onset   Hypertension Father    Diabetes Paternal Uncle    Renal Disease Maternal Grandmother     Social History Social History   Tobacco Use   Smoking status: Every Day    Packs/day: 0.00    Types: Cigarettes     Last attempt to quit: 01/26/2015    Years since quitting: 6.7   Smokeless tobacco: Never  Vaping Use   Vaping Use: Never used  Substance Use Topics   Alcohol use: Yes   Drug use: Yes    Types: Marijuana     Allergies   Patient has no known allergies.   Review of Systems Review of Systems  Constitutional:  Positive for chills. Negative for fever.  HENT:  Positive for congestion, sinus pressure and sore throat. Negative for ear pain.   Eyes:  Negative for discharge and redness.  Respiratory:  Positive for cough. Negative for shortness of breath and wheezing.   Gastrointestinal:  Negative for abdominal pain, diarrhea, nausea and vomiting.    Physical Exam Triage Vital Signs ED Triage Vitals  Enc Vitals Group     BP 10/22/21 0844 (!) 161/94     Pulse Rate 10/22/21 0844 81     Resp 10/22/21 0844 16     Temp 10/22/21 0844 98.3 F (36.8 C)     Temp Source 10/22/21 0844 Oral     SpO2 10/22/21 0844  98 %     Weight --      Height --      Head Circumference --      Peak Flow --      Pain Score 10/22/21 0855 7     Pain Loc --      Pain Edu? --      Excl. in [place]? --    No data found.  Updated Vital Signs BP (!) 161/94 (BP Location: Left Arm)   Pulse 81   Temp 98.3 F (36.8 C) (Oral)   Resp 16   SpO2 98%      Physical Exam Vitals and nursing note reviewed.  Constitutional:      General: She is not in acute distress.    Appearance: Normal appearance. She is not ill-appearing.  HENT:     Head: Normocephalic and atraumatic.     Nose: Congestion present.     Mouth/Throat:     Mouth: Mucous membranes are moist.     Pharynx: No oropharyngeal exudate or posterior oropharyngeal erythema.  Eyes:     Conjunctiva/sclera: Conjunctivae normal.  Cardiovascular:     Rate and Rhythm: Normal rate and regular rhythm.     Heart sounds: Normal heart sounds. No murmur heard. Pulmonary:     Effort: Pulmonary effort is normal. No respiratory distress.     Breath sounds: Normal  breath sounds. No wheezing, rhonchi or rales.  Skin:    General: Skin is warm and dry.  Neurological:     Mental Status: She is alert.  Psychiatric:        Mood and Affect: Mood normal.        Thought Content: Thought content normal.     UC Treatments / Results  Labs (all labs ordered are listed, but only abnormal results are displayed) Labs Reviewed  POCT INFLUENZA A/B - Abnormal; Notable for the following components:      Result Value   Influenza A, POC Positive (*)    All other components within normal limits  POCT URINE PREGNANCY    EKG   Radiology No results found.  Procedures Procedures (including critical care time)  Medications Ordered in UC Medications - No data to display  Initial Impression / Assessment and Plan / UC Course  I have reviewed the triage vital signs and the nursing notes.  Pertinent labs & imaging results that were available during my care of the patient were reviewed by me and considered in my medical decision making (see chart for details).    Tamiflu prescribed for influenza. Recommended symptomatic treatment otherwise. Encouraged follow up if symptoms fail to improve or worsen.   Final Clinical Impressions(s) / UC Diagnoses   Final diagnoses:  Influenza   Discharge Instructions   None    ED Prescriptions     Medication Sig Dispense Auth. Provider   oseltamivir (TAMIFLU) 75 MG capsule Take 1 capsule (75 mg total) by mouth every 12 (twelve) hours. 10 capsule Francene Finders, PA-C      PDMP not reviewed this encounter.   Francene Finders, PA-C 10/22/21 [phone]

## 2021-10-22 NOTE — ED Triage Notes (Addendum)
Chills, body aches, cough, since Wednesday.   Also requesting pregnancy test

## 2022-02-22 ENCOUNTER — Telehealth: Payer: Medicaid Other | Admitting: Physician Assistant

## 2022-02-22 DIAGNOSIS — R3989 Other symptoms and signs involving the genitourinary system: Secondary | ICD-10-CM | POA: Diagnosis not present

## 2022-02-22 MED ORDER — CEPHALEXIN 500 MG PO CAPS: 500.0000 mg | ORAL_CAPSULE | Freq: Two times a day (BID) | ORAL | 0 refills | Status: AC

## 2022-02-22 NOTE — Progress Notes (Signed)
I have spent 5 minutes in review of e-visit questionnaire, review and updating patient chart, medical decision making and response to patient.   Emmalie Haigh Cody Analyah Mcconnon, PA-C    

## 2022-02-22 NOTE — Progress Notes (Signed)

## 2022-03-01 ENCOUNTER — Telehealth: Payer: Medicaid Other | Admitting: Physician Assistant

## 2022-03-01 DIAGNOSIS — B379 Candidiasis, unspecified: Secondary | ICD-10-CM

## 2022-03-01 DIAGNOSIS — T3695XA Adverse effect of unspecified systemic antibiotic, initial encounter: Secondary | ICD-10-CM

## 2022-03-01 MED ORDER — FLUCONAZOLE 150 MG PO TABS
150.0000 mg | ORAL_TABLET | Freq: Once | ORAL | 0 refills | Status: AC
Start: 2022-03-01 — End: 2022-03-01

## 2022-03-01 NOTE — Progress Notes (Signed)
I have spent 5 minutes in review of e-visit questionnaire, review and updating patient chart, medical decision making and response to patient.   Rabon Scholle Cody Dennise Raabe, PA-C    

## 2022-03-01 NOTE — Progress Notes (Signed)

## 2022-04-28 ENCOUNTER — Encounter: Payer: Self-pay | Admitting: Emergency Medicine

## 2022-04-28 ENCOUNTER — Ambulatory Visit
Admission: EM | Admit: 2022-04-28 | Discharge: 2022-04-28 | Disposition: A | Discharge status: Home / Self Care | Payer: Medicaid Other | Attending: Family Medicine | Admitting: Family Medicine

## 2022-04-28 DIAGNOSIS — R109 Unspecified abdominal pain: Secondary | ICD-10-CM | POA: Insufficient documentation

## 2022-04-28 LAB — POCT URINALYSIS DIP (MANUAL ENTRY)
Bilirubin, UA: NEGATIVE
Blood, UA: NEGATIVE
Glucose, UA: NEGATIVE mg/dL
Ketones, POC UA: NEGATIVE mg/dL
Nitrite, UA: NEGATIVE
Protein Ur, POC: 30 mg/dL — AB
Spec Grav, UA: >=1.03 — AB (ref 1.010–1.025)
Urobilinogen, UA: 0.2 E.U./dL
pH, UA: 6.5 (ref 5.0–8.0)

## 2022-04-28 LAB — POCT URINE PREGNANCY: Preg Test, Ur: NEGATIVE

## 2022-04-28 MED ORDER — NITROFURANTOIN MONOHYD MACRO 100 MG PO CAPS: 100.0000 mg | ORAL_CAPSULE | Freq: Two times a day (BID) | ORAL | 0 refills | Status: DC

## 2022-04-28 NOTE — ED Triage Notes (Signed)
Pt is present today with lower abdominal cramping. Pt states that her sx started one week.

## 2022-04-28 NOTE — ED Provider Notes (Addendum)
[name]    CSN: [id number] Arrival date & time: 04/28/22  1347      History   Chief Complaint Chief Complaint  Patient presents with   Abdominal Cramping    HPI Theresa Horne is a 31 y.o. female.    Abdominal Cramping  Here for suprapubic cramping that is been going on for about a week.  Last menstrual period was about 6 weeks ago.  She is not usually irregular.  No breast tenderness or nausea.  No dysuria or hematuria or vaginal discharge or itching.  No fever  Past Medical History:  Diagnosis Date   Hypertension    Preterm labor     Patient Active Problem List   Diagnosis Date Noted   Normal labor and delivery 04/27/2019   Anemia 10/04/2015   Abdominal pressure 06/09/2015   History of preterm delivery, currently pregnant -- delivered at 25 wks in March 2012 06/09/2015   Hydradenitis 04/16/2012    Past Surgical History:  Procedure Laterality Date   INDUCED ABORTION      OB History     Gravida  5   Para  3   Term  2   Preterm  1   AB  1   Living  3      SAB      IAB  1   Ectopic      Multiple  0   Live Births  3            Home Medications    Prior to Admission medications   Medication Sig Start Date End Date Taking? Authorizing Provider  nitrofurantoin, macrocrystal-monohydrate, (MACROBID) 100 MG capsule Take 1 capsule (100 mg total) by mouth 2 (two) times daily. 04/28/22  Yes Barrett Henle, MD  metoprolol succinate (TOPROL-XL) 25 MG 24 hr tablet Take 1 tablet (25 mg total) by mouth daily. 01/04/21 02/06/21  Elouise Munroe, MD    Family History Family History  Problem Relation Age of Onset   Hypertension Father    Diabetes Paternal Uncle    Renal Disease Maternal Grandmother     Social History Social History   Tobacco Use   Smoking status: Every Day    Packs/day: 0.00    Types: Cigarettes    Last attempt to quit: 01/26/2015    Years since quitting: 7.2   Smokeless tobacco: Never  Vaping  Use   Vaping Use: Never used  Substance Use Topics   Alcohol use: Yes   Drug use: Yes    Types: Marijuana     Allergies   Patient has no known allergies.   Review of Systems Review of Systems   Physical Exam Triage Vital Signs ED Triage Vitals  Enc Vitals Group     BP 04/28/22 1407 (!) 150/88     Pulse --      Resp 04/28/22 1407 18     Temp 04/28/22 1407 97.8 F (36.6 C)     Temp Source 04/28/22 1407 Oral     SpO2 04/28/22 1407 98 %     Weight --      Height --      Head Circumference --      Peak Flow --      Pain Score 04/28/22 1408 6     Pain Loc --      Pain Edu? --      Excl. in [place]? --    No data found.  Updated Vital Signs BP Marland Kitchen)  150/88   Temp 97.8 F (36.6 C) (Oral)   Resp 18   LMP 03/08/2022   SpO2 98%   Visual Acuity Right Eye Distance:   Left Eye Distance:   Bilateral Distance:    Right Eye Near:   Left Eye Near:    Bilateral Near:     Physical Exam Vitals reviewed.  Constitutional:      General: She is not in acute distress.    Appearance: She is not ill-appearing or toxic-appearing.  HENT:     Mouth/Throat:     Mouth: Mucous membranes are moist.  Eyes:     Extraocular Movements: Extraocular movements intact.     Pupils: Pupils are equal, round, and reactive to light.  Cardiovascular:     Rate and Rhythm: Normal rate and regular rhythm.     Heart sounds: No murmur heard. Pulmonary:     Effort: Pulmonary effort is normal.     Breath sounds: Normal breath sounds.  Abdominal:     General: There is no distension.     Palpations: Abdomen is soft.     Tenderness: There is no abdominal tenderness.  Musculoskeletal:     Cervical back: Neck supple.  Lymphadenopathy:     Cervical: No cervical adenopathy.  Skin:    Capillary Refill: Capillary refill takes less than 2 seconds.     Coloration: Skin is not jaundiced or pale.  Neurological:     General: No focal deficit present.     Mental Status: She is alert and oriented to person,  place, and time.  Psychiatric:        Behavior: Behavior normal.     UC Treatments / Results  Labs (all labs ordered are listed, but only abnormal results are displayed) Labs Reviewed  POCT URINALYSIS DIP (MANUAL ENTRY) - Abnormal; Notable for the following components:      Result Value   Clarity, UA cloudy (*)    Spec Grav, UA >=1.030 (*)    Protein Ur, POC =30 (*)    Leukocytes, UA Small (1+) (*)    All other components within normal limits  URINE CULTURE  POCT URINE PREGNANCY  CERVICOVAGINAL ANCILLARY ONLY    EKG   Radiology No results found.  Procedures Procedures (including critical care time)  Medications Ordered in UC Medications - No data to display  Initial Impression / Assessment and Plan / UC Course  I have reviewed the triage vital signs and the nursing notes.  Pertinent labs & imaging results that were available during my care of the patient were reviewed by me and considered in my medical decision making (see chart for details).     UPT is negative.  Urinalysis is shows cloudy urine with some white blood cells.  I am going to treat for possible UTI.  Swab done and staff will call her if anything is positive and treat per protocol Final Clinical Impressions(s) / UC Diagnoses   Final diagnoses:  Abdominal cramping     Discharge Instructions      Your urinalysis did show some white blood cells.  Urine pregnancy test was negative  Take nitrofurantoin 100 mg--1 capsule 2 times daily for 5 days for urine infection  urine culture is sent, and staff will call you if we need to change her antibiotic  Staff will notify you if there is anything positive on your       ED Prescriptions     Medication Sig Dispense Auth. Provider   nitrofurantoin, macrocrystal-monohydrate, (  MACROBID) 100 MG capsule Take 1 capsule (100 mg total) by mouth 2 (two) times daily. 10 capsule Barrett Henle, MD      PDMP not reviewed this encounter.   Barrett Henle, MD 04/28/22 1445    Barrett Henle, MD 04/28/22 1447

## 2022-04-28 NOTE — Discharge Instructions (Addendum)
Your urinalysis did show some white blood cells.  Urine pregnancy test was negative  Take nitrofurantoin 100 mg--1 capsule 2 times daily for 5 days for urine infection  urine culture is sent, and staff will call you if we need to change her antibiotic  Staff will notify you if there is anything positive on your

## 2022-04-29 LAB — URINE CULTURE: Culture: <10000 — AB

## 2022-05-02 LAB — CERVICOVAGINAL ANCILLARY ONLY
Bacterial Vaginitis (gardnerella): POSITIVE — AB
Candida Glabrata: NEGATIVE
Candida Vaginitis: POSITIVE — AB
Chlamydia: POSITIVE — AB
Comment: NEGATIVE
Comment: NEGATIVE
Comment: NEGATIVE
Comment: NEGATIVE
Comment: NEGATIVE
Comment: NORMAL
Neisseria Gonorrhea: NEGATIVE
Trichomonas: NEGATIVE

## 2022-05-03 ENCOUNTER — Telehealth (HOSPITAL_COMMUNITY): Payer: Self-pay | Admitting: Emergency Medicine

## 2022-05-03 ENCOUNTER — Encounter: Payer: BC Managed Care – PPO | Admitting: Nurse Practitioner

## 2022-05-03 DIAGNOSIS — Z0289 Encounter for other administrative examinations: Secondary | ICD-10-CM

## 2022-05-03 MED ORDER — FLUCONAZOLE 150 MG PO TABS: 150.0000 mg | ORAL_TABLET | Freq: Once | ORAL | 0 refills | Status: AC

## 2022-05-03 MED ORDER — DOXYCYCLINE HYCLATE 100 MG PO CAPS: 100.0000 mg | ORAL_CAPSULE | Freq: Two times a day (BID) | ORAL | 0 refills | Status: AC

## 2022-05-03 MED ORDER — METRONIDAZOLE 500 MG PO TABS: 500.0000 mg | ORAL_TABLET | Freq: Two times a day (BID) | ORAL | 0 refills | Status: DC

## 2022-05-21 ENCOUNTER — Encounter: Payer: Medicaid Other | Admitting: Nurse Practitioner

## 2022-05-21 DIAGNOSIS — N760 Acute vaginitis: Secondary | ICD-10-CM

## 2022-05-24 ENCOUNTER — Ambulatory Visit (INDEPENDENT_AMBULATORY_CARE_PROVIDER_SITE_OTHER): Payer: BC Managed Care – PPO | Admitting: Nurse Practitioner

## 2022-05-24 ENCOUNTER — Encounter: Payer: Self-pay | Admitting: Nurse Practitioner

## 2022-05-24 VITALS — BP 130/84 | Ht 64.0 in | Wt 218.0 lb

## 2022-05-24 DIAGNOSIS — N898 Other specified noninflammatory disorders of vagina: Secondary | ICD-10-CM

## 2022-05-24 DIAGNOSIS — N760 Acute vaginitis: Secondary | ICD-10-CM | POA: Diagnosis not present

## 2022-05-24 DIAGNOSIS — B9689 Other specified bacterial agents as the cause of diseases classified elsewhere: Secondary | ICD-10-CM

## 2022-05-24 DIAGNOSIS — Z113 Encounter for screening for infections with a predominantly sexual mode of transmission: Secondary | ICD-10-CM | POA: Diagnosis not present

## 2022-05-24 LAB — WET PREP FOR TRICH, YEAST, CLUE

## 2022-05-24 MED ORDER — METRONIDAZOLE 0.75 % VA GEL: 1.0000 | Freq: Every day | VAGINAL | 0 refills | Status: AC

## 2022-05-25 LAB — C. TRACHOMATIS/N. GONORRHOEAE RNA
C. trachomatis RNA, TMA: NOT DETECTED
N. gonorrhoeae RNA, TMA: NOT DETECTED

## 2022-05-25 LAB — HIV ANTIBODY (ROUTINE TESTING W REFLEX): HIV 1&2 Ab, 4th Generation: NONREACTIVE

## 2022-05-25 LAB — RPR: RPR Ser Ql: NONREACTIVE

## 2022-07-13 ENCOUNTER — Ambulatory Visit
Admission: EM | Admit: 2022-07-13 | Discharge: 2022-07-13 | Discharge status: Against Medical Advice | Payer: Medicaid Other

## 2022-07-13 NOTE — ED Notes (Signed)
Pt not answering from lobby 

## 2022-07-13 NOTE — ED Notes (Signed)
Pt called from the lobby no answer

## 2022-08-02 ENCOUNTER — Ambulatory Visit: Payer: Medicaid Other | Admitting: Family Medicine

## 2022-08-17 ENCOUNTER — Encounter: Payer: BC Managed Care – PPO | Admitting: Nurse Practitioner

## 2022-08-18 NOTE — Progress Notes (Signed)
Erroneous encounter. Canceled visit.

## 2022-08-25 ENCOUNTER — Ambulatory Visit
Admission: EM | Admit: 2022-08-25 | Discharge: 2022-08-25 | Disposition: A | Discharge status: Home / Self Care | Payer: BC Managed Care – PPO | Attending: Physician Assistant | Admitting: Physician Assistant

## 2022-08-25 DIAGNOSIS — J0100 Acute maxillary sinusitis, unspecified: Secondary | ICD-10-CM | POA: Diagnosis not present

## 2022-08-25 MED ORDER — FLUTICASONE PROPIONATE 50 MCG/ACT NA SUSP: 2.0000 | Freq: Every day | NASAL | 0 refills | Status: DC

## 2022-08-25 MED ORDER — AMOXICILLIN-POT CLAVULANATE 875-125 MG PO TABS: 1.0000 | ORAL_TABLET | Freq: Two times a day (BID) | ORAL | 0 refills | Status: DC

## 2022-08-25 NOTE — Discharge Instructions (Addendum)
Advised to use Flonase nasal spray, 2 sprays each nostril once daily to help decrease sinus can congestion. Advised take the Augmentin every 12 hours with food in order to treat sinus infection. Advised take Tylenol or ibuprofen to help relieve pain or discomfort. Advised to follow-up PCP or return to urgent care if symptoms fail to improve.

## 2022-08-25 NOTE — ED Triage Notes (Signed)
Pt c/o nasal congestion and sinus pressure since Friday. Taking OTC meds with no relief.

## 2022-08-25 NOTE — ED Provider Notes (Signed)
[name]    CSN: [id number] Arrival date & time: 08/25/22  1559      History   Chief Complaint Chief Complaint  Patient presents with   Nasal Congestion    HPI Theresa Horne is a 31 y.o. female.   31 year old female presents with sinus pressure and pain.  Indicates for the past week she has been having persistent and progressive frontal and maxillary sinus pain, pressure, and tenderness.  She relates she has had some intermittent dizziness along with the sinus pressure.  She also indicates that she has been having production which is yellow and green on a regular basis.  She indicates that she has been taken DayQuil and NyQuil for relief but this is only helped a little bit.  She says that she is having facial pain that is more along the maxillary sinus areas on both sides.  She denies fever, chills, body aches or pains.  She relates she is not having any cough or congestion.     Past Medical History:  Diagnosis Date   Hypertension    Preterm labor    STD (sexually transmitted disease)    Chlamydia    Patient Active Problem List   Diagnosis Date Noted   Normal labor and delivery 04/27/2019   Anemia 10/04/2015   Abdominal pressure 06/09/2015   History of preterm delivery, currently pregnant -- delivered at 25 wks in March 2012 06/09/2015   Hydradenitis 04/16/2012    Past Surgical History:  Procedure Laterality Date   INDUCED ABORTION      OB History     Gravida  5   Para  3   Term  2   Preterm  1   AB  1   Living  3      SAB      IAB  1   Ectopic      Multiple  0   Live Births  3            Home Medications    Prior to Admission medications   Medication Sig Start Date End Date Taking? Authorizing Provider  amoxicillin-clavulanate (AUGMENTIN) 875-125 MG tablet Take 1 tablet by mouth every 12 (twelve) hours. 08/25/22  Yes Nyoka Lint, PA-C  fluticasone [facility]) 50 MCG/ACT nasal spray Place 2 sprays into both  nostrils daily. 08/25/22  Yes Nyoka Lint, PA-C  metoprolol succinate (TOPROL-XL) 25 MG 24 hr tablet Take 1 tablet (25 mg total) by mouth daily. 01/04/21 02/06/21  Elouise Munroe, MD    Family History Family History  Problem Relation Age of Onset   Hypertension Mother    Diabetes Father    Hypertension Father    Diabetes Paternal Uncle    Renal Disease Maternal Grandmother    Diabetes Paternal Grandfather     Social History Social History   Tobacco Use   Smoking status: Former    Packs/day: 0.00    Types: Cigarettes    Quit date: 01/26/2015    Years since quitting: 7.5   Smokeless tobacco: Never  Vaping Use   Vaping Use: Never used  Substance Use Topics   Alcohol use: Yes    Comment: Occas   Drug use: Yes    Types: Marijuana     Allergies   Patient has no known allergies.   Review of Systems Review of Systems  HENT:  Positive for sinus pressure and sinus pain.      Physical Exam Triage Vital Signs ED Triage Vitals  Enc Vitals Group     BP 08/25/22 1619 (!) 159/98     Pulse Rate 08/25/22 1619 (!) 42     Resp --      Temp 08/25/22 1619 98.7 F (37.1 C)     Temp Source 08/25/22 1619 Oral     SpO2 08/25/22 1619 98 %     Weight --      Height --      Head Circumference --      Peak Flow --      Pain Score 08/25/22 1620 8     Pain Loc --      Pain Edu? --      Excl. in GC? --    No data found.  Updated Vital Signs BP (!) 159/98 (BP Location: Left Arm)   Pulse (!) 42   Temp 98.7 F (37.1 C) (Oral)   LMP 08/12/2022   SpO2 98%   Visual Acuity Right Eye Distance:   Left Eye Distance:   Bilateral Distance:    Right Eye Near:   Left Eye Near:    Bilateral Near:     Physical Exam Constitutional:      Appearance: Normal appearance.  HENT:     Right Ear: Ear canal normal. Tympanic membrane is injected.     Left Ear: Ear canal normal. Tympanic membrane is injected.     Mouth/Throat:     Mouth: Mucous membranes are moist.     Pharynx:  Oropharynx is clear.  Cardiovascular:     Rate and Rhythm: Normal rate and regular rhythm.     Heart sounds: Normal heart sounds.  Pulmonary:     Effort: Pulmonary effort is normal.     Breath sounds: Normal breath sounds and air entry. No wheezing, rhonchi or rales.  Lymphadenopathy:     Cervical: No cervical adenopathy.  Neurological:     Mental Status: She is alert.      UC Treatments / Results  Labs (all labs ordered are listed, but only abnormal results are displayed) Labs Reviewed - No data to display  EKG   Radiology No results found.  Procedures Procedures (including critical care time)  Medications Ordered in UC Medications - No data to display  Initial Impression / Assessment and Plan / UC Course  I have reviewed the triage vital signs and the nursing notes.  Pertinent labs & imaging results that were available during my care of the patient were reviewed by me and considered in my medical decision making (see chart for details).    Plan: 1.  The sinus infection will be treated with the following: A.  Augmentin every 12 hours with food to treat the sinus infection. B.  The sinus congestion will be treated with Flonase nasal spray, 2 sprays each nostril once daily. C.  Advised to take Tylenol or ibuprofen for pain relief. 3.  Advised to follow-up PCP or return to urgent care if symptoms fail to improve Final Clinical Impressions(s) / UC Diagnoses   Final diagnoses:  Acute non-recurrent maxillary sinusitis     Discharge Instructions      Advised to use Flonase nasal spray, 2 sprays each nostril once daily to help decrease sinus can congestion. Advised take the Augmentin every 12 hours with food in order to treat sinus infection. Advised take Tylenol or ibuprofen to help relieve pain or discomfort. Advised to follow-up PCP or return to urgent care if symptoms fail to improve.    ED Prescriptions  Medication Sig Dispense Auth. Provider    amoxicillin-clavulanate (AUGMENTIN) 875-125 MG tablet Take 1 tablet by mouth every 12 (twelve) hours. 14 tablet Ellsworth Lennox, PA-C   fluticasone [facility]) 50 MCG/ACT nasal spray Place 2 sprays into both nostrils daily. 11.1 g Ellsworth Lennox, PA-C      PDMP not reviewed this encounter.   Ellsworth Lennox, PA-C 08/25/22 [phone]

## 2022-09-02 ENCOUNTER — Telehealth: Payer: BC Managed Care – PPO | Admitting: Physician Assistant

## 2022-09-02 DIAGNOSIS — B379 Candidiasis, unspecified: Secondary | ICD-10-CM

## 2022-09-02 DIAGNOSIS — T3695XA Adverse effect of unspecified systemic antibiotic, initial encounter: Secondary | ICD-10-CM

## 2022-09-03 MED ORDER — FLUCONAZOLE 150 MG PO TABS: ORAL_TABLET | ORAL | 0 refills | Status: DC

## 2022-09-03 NOTE — Progress Notes (Signed)

## 2022-09-03 NOTE — Progress Notes (Signed)
I have spent 5 minutes in review of e-visit questionnaire, review and updating patient chart, medical decision making and response to patient.   Ezinne Yogi Cody Katha Kuehne, PA-C    

## 2022-09-07 ENCOUNTER — Ambulatory Visit
Admission: EM | Admit: 2022-09-07 | Discharge: 2022-09-07 | Disposition: A | Discharge status: Home / Self Care | Payer: BC Managed Care – PPO | Attending: Physician Assistant | Admitting: Physician Assistant

## 2022-09-07 DIAGNOSIS — N760 Acute vaginitis: Secondary | ICD-10-CM | POA: Diagnosis not present

## 2022-09-07 LAB — POCT URINALYSIS DIP (MANUAL ENTRY)
Bilirubin, UA: NEGATIVE
Blood, UA: NEGATIVE
Glucose, UA: NEGATIVE mg/dL
Ketones, POC UA: NEGATIVE mg/dL
Leukocytes, UA: NEGATIVE
Nitrite, UA: NEGATIVE
Protein Ur, POC: NEGATIVE mg/dL
Spec Grav, UA: >=1.03 — AB (ref 1.010–1.025)
Urobilinogen, UA: 0.2 E.U./dL
pH, UA: 6 (ref 5.0–8.0)

## 2022-09-07 NOTE — Discharge Instructions (Addendum)
Lab test will be completed in 48 hours.  If you do not hear from the office within 48 hours that indicates the test are negative.  Log onto MyChart to review the results when it post in 24 and 48 hours. Advised to follow-up with PCP or return to urgent care if symptoms fail to improve.

## 2022-09-07 NOTE — ED Provider Notes (Signed)
[name]    CSN: [id number] Arrival date & time: 09/07/22  0836      History   Chief Complaint Chief Complaint  Patient presents with   Vaginal Discharge    HPI Theresa Horne is a 31 y.o. female.   31 year old female presents with vaginal itching and discharge.  Patient indicates for the past week she has been having external vaginal itching, with discharge.  She relates that the discharge is thick, clear, and she continues to have external and internal vaginal itching.  She relates that there is no odor with the discharge.  She indicates that she had a ED visit on 10 6 in which she was given a prescription for Diflucan tablets.  She indicates she took the first tablet and then repeated and took the second tablet 3 days later.  She indicates that her symptoms did improve a little bit but she still continues to have discharge and external itching.  She is concerned that there may be a possible bacterial infection present along with a yeast infection.  She indicates she has not have any fever, chills, lower back pain.  She relates that she has some frequency but no dysuria or urgency.   Vaginal Discharge   Past Medical History:  Diagnosis Date   Hypertension    Preterm labor    STD (sexually transmitted disease)    Chlamydia    Patient Active Problem List   Diagnosis Date Noted   Normal labor and delivery 04/27/2019   Anemia 10/04/2015   Abdominal pressure 06/09/2015   History of preterm delivery, currently pregnant -- delivered at 25 wks in March 2012 06/09/2015   Hydradenitis 04/16/2012    Past Surgical History:  Procedure Laterality Date   INDUCED ABORTION      OB History     Gravida  5   Para  3   Term  2   Preterm  1   AB  1   Living  3      SAB      IAB  1   Ectopic      Multiple  0   Live Births  3            Home Medications    Prior to Admission medications   Medication Sig Start Date End Date Taking?  Authorizing Provider  amoxicillin-clavulanate (AUGMENTIN) 875-125 MG tablet Take 1 tablet by mouth every 12 (twelve) hours. 08/25/22   Nyoka Lint, PA-C  fluconazole (DIFLUCAN) 150 MG tablet Take 1 tablet PO once. Repeat in 3 days if needed. 09/03/22   Brunetta Jeans, PA-C  fluticasone (FLONASE) 50 MCG/ACT nasal spray Place 2 sprays into both nostrils daily. 08/25/22   Nyoka Lint, PA-C  metoprolol succinate (TOPROL-XL) 25 MG 24 hr tablet Take 1 tablet (25 mg total) by mouth daily. 01/04/21 02/06/21  Elouise Munroe, MD    Family History Family History  Problem Relation Age of Onset   Hypertension Mother    Diabetes Father    Hypertension Father    Diabetes Paternal Uncle    Renal Disease Maternal Grandmother    Diabetes Paternal Grandfather     Social History Social History   Tobacco Use   Smoking status: Former    Packs/day: 0.00    Types: Cigarettes    Quit date: 01/26/2015    Years since quitting: 7.6   Smokeless tobacco: Never  Vaping Use   Vaping Use: Never used  Substance Use Topics  Alcohol use: Yes    Comment: Occas   Drug use: Yes    Types: Marijuana     Allergies   Patient has no known allergies.   Review of Systems Review of Systems  Genitourinary:  Positive for vaginal discharge (clear with itching).     Physical Exam Triage Vital Signs ED Triage Vitals [09/07/22 0909]  Enc Vitals Group     BP (!) 142/90     Pulse Rate 70     Resp 16     Temp 98 F (36.7 C)     Temp Source Oral     SpO2 98 %     Weight      Height      Head Circumference      Peak Flow      Pain Score 0     Pain Loc      Pain Edu?      Excl. in GC?    No data found.  Updated Vital Signs BP (!) 142/90 (BP Location: Left Arm)   Pulse 70   Temp 98 F (36.7 C) (Oral)   Resp 16   LMP 08/12/2022   SpO2 98%   Visual Acuity Right Eye Distance:   Left Eye Distance:   Bilateral Distance:    Right Eye Near:   Left Eye Near:    Bilateral Near:     Physical  Exam Constitutional:      Appearance: Normal appearance.  Abdominal:     General: Abdomen is flat.     Palpations: Abdomen is soft.     Tenderness: There is no abdominal tenderness. There is no guarding or rebound.  Neurological:     Mental Status: She is alert.      UC Treatments / Results  Labs (all labs ordered are listed, but only abnormal results are displayed) Labs Reviewed  POCT URINALYSIS DIP (MANUAL ENTRY)  CERVICOVAGINAL ANCILLARY ONLY    EKG   Radiology No results found.  Procedures Procedures (including critical care time)  Medications Ordered in UC Medications - No data to display  Initial Impression / Assessment and Plan / UC Course  I have reviewed the triage vital signs and the nursing notes.  Pertinent labs & imaging results that were available during my care of the patient were reviewed by me and considered in my medical decision making (see chart for details).    Plan: 1.  The vaginitis will be treated with the following: A.  Lab results of 4 BV and yeast are pending. B.  The vaginitis will be treated depending on the results of the lab test. 2.  Patient advised to follow-up with PCP or return to urgent care if symptoms fail to improve. Final Clinical Impressions(s) / UC Diagnoses   Final diagnoses:  Vaginitis and vulvovaginitis     Discharge Instructions      Lab test will be completed in 48 hours.  If you do not hear from the office within 48 hours that indicates the test are negative.  Log onto MyChart to review the results when it post in 24 and 48 hours. Advised to follow-up with PCP or return to urgent care if symptoms fail to improve.    ED Prescriptions   None    PDMP not reviewed this encounter.   Ellsworth Lennox, PA-C 09/07/22 [phone]

## 2022-09-07 NOTE — ED Triage Notes (Signed)
Pt c/o vaginal itchiness, discharge, urinary frequency. Onset ~ 5 days ago. Was tx via telemed used fluconazole which helped but did not resolve.

## 2022-09-08 ENCOUNTER — Telehealth (HOSPITAL_COMMUNITY): Payer: Self-pay | Admitting: Emergency Medicine

## 2022-09-08 LAB — CERVICOVAGINAL ANCILLARY ONLY
Bacterial Vaginitis (gardnerella): NEGATIVE
Candida Glabrata: NEGATIVE
Candida Vaginitis: POSITIVE — AB
Comment: NEGATIVE
Comment: NEGATIVE
Comment: NEGATIVE

## 2022-09-08 MED ORDER — FLUCONAZOLE 150 MG PO TABS: 150.0000 mg | ORAL_TABLET | Freq: Once | ORAL | 0 refills | Status: AC

## 2022-09-28 ENCOUNTER — Telehealth: Payer: BC Managed Care – PPO | Admitting: Physician Assistant

## 2022-09-28 DIAGNOSIS — J019 Acute sinusitis, unspecified: Secondary | ICD-10-CM | POA: Diagnosis not present

## 2022-09-28 DIAGNOSIS — B9789 Other viral agents as the cause of diseases classified elsewhere: Secondary | ICD-10-CM | POA: Diagnosis not present

## 2022-09-28 MED ORDER — AZELASTINE HCL 0.1 % NA SOLN: 1.0000 | Freq: Two times a day (BID) | NASAL | 0 refills | Status: DC

## 2022-09-28 NOTE — Progress Notes (Signed)
E-Visit for Sinus Problems  We are sorry that you are not feeling well.  Here is how we plan to help!  Based on what you have shared with me it looks like you have sinusitis.  Sinusitis is inflammation and infection in the sinus cavities of the head.  Based on your presentation I believe you most likely have Acute Viral Sinusitis.This is an infection most likely caused by a virus. There is not specific treatment for viral sinusitis other than to help you with the symptoms until the infection runs its course.  You may use an oral decongestant such as Mucinex D or if you have glaucoma or high blood pressure use plain Mucinex. Saline nasal spray help and can safely be used as often as needed for congestion, I have prescribed: Azelastine nasal spray 2 sprays in each nostril twice a day; Can use this with the Flonase nasal spray you should already have.  Some authorities believe that zinc sprays or the use of Echinacea may shorten the course of your symptoms.  Sinus infections are not as easily transmitted as other respiratory infection, however we still recommend that you avoid close contact with loved ones, especially the very young and elderly.  Remember to wash your hands thoroughly throughout the day as this is the number one way to prevent the spread of infection!  Home Care: Only take medications as instructed by your medical team. Do not take these medications with alcohol. A steam or ultrasonic humidifier can help congestion.  You can place a towel over your head and breathe in the steam from hot water coming from a faucet. Avoid close contacts especially the very young and the elderly. Cover your mouth when you cough or sneeze. Always remember to wash your hands.  Get Help Right Away If: You develop worsening fever or sinus pain. You develop a severe head ache or visual changes. Your symptoms persist after you have completed your treatment plan.  Make sure you Understand these  instructions. Will watch your condition. Will get help right away if you are not doing well or get worse.   Thank you for choosing an e-visit.  Your e-visit answers were reviewed by a board certified advanced clinical practitioner to complete your personal care plan. Depending upon the condition, your plan could have included both over the counter or prescription medications.  Please review your pharmacy choice. Make sure the pharmacy is open so you can pick up prescription now. If there is a problem, you may contact your provider through [employer] and have the prescription routed to another pharmacy.  Your safety is important to [name]. If you have drug allergies check your prescription carefully.   For the next 24 hours you can use MyChart to ask questions about today's visit, request a non-urgent call back, or ask for a work or school excuse. You will get an email in the next two days asking about your experience. I hope that your e-visit has been valuable and will speed your recovery.  I have spent 5 minutes in review of e-visit questionnaire, review and updating patient chart, medical decision making and response to patient.   Mar Daring, PA-C

## 2022-10-09 ENCOUNTER — Ambulatory Visit
Admission: EM | Admit: 2022-10-09 | Discharge: 2022-10-09 | Disposition: A | Discharge status: Home / Self Care | Payer: BC Managed Care – PPO | Attending: Physician Assistant | Admitting: Physician Assistant

## 2022-10-09 DIAGNOSIS — M79672 Pain in left foot: Secondary | ICD-10-CM | POA: Diagnosis not present

## 2022-10-09 MED ORDER — PREDNISONE 20 MG PO TABS: 40.0000 mg | ORAL_TABLET | Freq: Every day | ORAL | 0 refills | Status: AC

## 2022-10-09 NOTE — ED Provider Notes (Signed)
[name]    CSN: [id number] Arrival date & time: 10/09/22  1350      History   Chief Complaint Chief Complaint  Patient presents with   Foot Pain    HPI Theresa Horne is a 31 y.o. female.   Patient here today for evaluation of left foot pain that started yesterday.  She reports that she woke with pain.  She denies any known injury.  She states that weightbearing seems to worsen pain.  She denies any numbness or tingling.  She denies any new shoes or suspect causes of pain.  Pain is located to the lateral aspect of her foot diffusely. She is wearing a walking boot that does seem to be helpful.   The history is provided by the patient.  Foot Pain    Past Medical History:  Diagnosis Date   Hypertension    Preterm labor    STD (sexually transmitted disease)    Chlamydia    Patient Active Problem List   Diagnosis Date Noted   Normal labor and delivery 04/27/2019   Anemia 10/04/2015   Abdominal pressure 06/09/2015   History of preterm delivery, currently pregnant -- delivered at 25 wks in March 2012 06/09/2015   Hydradenitis 04/16/2012    Past Surgical History:  Procedure Laterality Date   INDUCED ABORTION      OB History     Gravida  5   Para  3   Term  2   Preterm  1   AB  1   Living  3      SAB      IAB  1   Ectopic      Multiple  0   Live Births  3            Home Medications    Prior to Admission medications   Medication Sig Start Date End Date Taking? Authorizing Provider  predniSONE (DELTASONE) 20 MG tablet Take 2 tablets (40 mg total) by mouth daily with breakfast for 5 days. 10/09/22 10/14/22 Yes Tomi Bamberger, PA-C  azelastine (ASTELIN) 0.1 % nasal spray Place 1 spray into both nostrils 2 (two) times daily. Use in each nostril as directed 09/28/22   Margaretann Loveless, PA-C  fluticasone [facility]) 50 MCG/ACT nasal spray Place 2 sprays into both nostrils daily. 08/25/22   Ellsworth Lennox, PA-C  metoprolol  succinate (TOPROL-XL) 25 MG 24 hr tablet Take 1 tablet (25 mg total) by mouth daily. 01/04/21 02/06/21  Parke Poisson, MD    Family History Family History  Problem Relation Age of Onset   Hypertension Mother    Diabetes Father    Hypertension Father    Diabetes Paternal Uncle    Renal Disease Maternal Grandmother    Diabetes Paternal Grandfather     Social History Social History   Tobacco Use   Smoking status: Former    Packs/day: 0.00    Types: Cigarettes    Quit date: 01/26/2015    Years since quitting: 7.7   Smokeless tobacco: Never  Vaping Use   Vaping Use: Never used  Substance Use Topics   Alcohol use: Yes    Comment: Occas   Drug use: Yes    Types: Marijuana     Allergies   Patient has no known allergies.   Review of Systems Review of Systems  Constitutional:  Negative for chills and fever.  Eyes:  Negative for discharge and redness.  Gastrointestinal:  Negative for nausea and  vomiting.  Musculoskeletal:  Positive for arthralgias.  Skin:  Negative for color change.  Neurological:  Negative for numbness.     Physical Exam Triage Vital Signs ED Triage Vitals  Enc Vitals Group     BP 10/09/22 1511 (!) 153/90     Pulse Rate 10/09/22 1511 69     Resp 10/09/22 1511 18     Temp 10/09/22 1511 98.1 F (36.7 C)     Temp Source 10/09/22 1511 Oral     SpO2 10/09/22 1511 98 %     Weight --      Height --      Head Circumference --      Peak Flow --      Pain Score 10/09/22 1351 6     Pain Loc --      Pain Edu? --      Excl. in GC? --    No data found.  Updated Vital Signs BP (!) 153/90 (BP Location: Left Arm)   Pulse 69   Temp 98.1 F (36.7 C) (Oral)   Resp 18   LMP  (LMP Unknown)   SpO2 98%      Physical Exam Vitals and nursing note reviewed.  Constitutional:      General: She is not in acute distress.    Appearance: Normal appearance. She is not ill-appearing.  HENT:     Head: Normocephalic and atraumatic.  Eyes:      Conjunctiva/sclera: Conjunctivae normal.  Cardiovascular:     Rate and Rhythm: Normal rate.  Pulmonary:     Effort: Pulmonary effort is normal. No respiratory distress.  Musculoskeletal:     Comments: Normal appearance of left foot without bruising, swelling, erythema  Neurological:     Mental Status: She is alert.  Psychiatric:        Mood and Affect: Mood normal.        Behavior: Behavior normal.        Thought Content: Thought content normal.      UC Treatments / Results  Labs (all labs ordered are listed, but only abnormal results are displayed) Labs Reviewed - No data to display  EKG   Radiology No results found.  Procedures Procedures (including critical care time)  Medications Ordered in UC Medications - No data to display  Initial Impression / Assessment and Plan / UC Course  I have reviewed the triage vital signs and the nursing notes.  Pertinent labs & imaging results that were available during my care of the patient were reviewed by me and considered in my medical decision making (see chart for details).    Steroid burst prescribed for treatment of suspected inflammatory causes of pain.  Recommended follow-up with Ortho if no gradual improvement. Patient expresses understanding.   Final Clinical Impressions(s) / UC Diagnoses   Final diagnoses:  Left foot pain   Discharge Instructions   None    ED Prescriptions     Medication Sig Dispense Auth. Provider   predniSONE (DELTASONE) 20 MG tablet Take 2 tablets (40 mg total) by mouth daily with breakfast for 5 days. 10 tablet Tomi Bamberger, PA-C      PDMP not reviewed this encounter.   Tomi Bamberger, PA-C 10/09/22 1552

## 2022-10-09 NOTE — ED Triage Notes (Signed)
Pt presents with left foot pain since yesterday with no known injury.

## 2022-10-11 ENCOUNTER — Ambulatory Visit (INDEPENDENT_AMBULATORY_CARE_PROVIDER_SITE_OTHER): Payer: BC Managed Care – PPO | Admitting: Nurse Practitioner

## 2022-10-11 ENCOUNTER — Other Ambulatory Visit (HOSPITAL_COMMUNITY)
Admission: RE | Admit: 2022-10-11 | Discharge: 2022-10-11 | Disposition: A | Discharge status: Home / Self Care | Payer: BC Managed Care – PPO | Source: Ambulatory Visit | Attending: Nurse Practitioner | Admitting: Nurse Practitioner

## 2022-10-11 ENCOUNTER — Encounter: Payer: Self-pay | Admitting: Nurse Practitioner

## 2022-10-11 VITALS — BP 138/84 | HR 58 | Resp 18 | Ht 62.6 in | Wt 223.8 lb

## 2022-10-11 DIAGNOSIS — Z23 Encounter for immunization: Secondary | ICD-10-CM | POA: Diagnosis not present

## 2022-10-11 DIAGNOSIS — Z01419 Encounter for gynecological examination (general) (routine) without abnormal findings: Secondary | ICD-10-CM

## 2022-10-11 NOTE — Progress Notes (Signed)
   Theresa Horne [birth year] [id number]   History:  31 y.o. [id number] presents for annual exam. Monthly cycles but did have 2 last month. Normal pap history, but very overdue, last in 2013. H/O chlamydia, recurrent yeast infections. Denies symptoms today.   Gynecologic History Patient's last menstrual period was 10/02/2022 (exact date). Period Cycle (Days): 30 Period Duration (Days): 5 Period Pattern: (!) Irregular Menstrual Flow: Moderate Menstrual Control: Maxi pad Menstrual Control Change Freq (Hours): 6 Dysmenorrhea: (!) Mild Dysmenorrhea Symptoms: Cramping Contraception/Family planning: rhythm method Sexually active: Yes, declines STD screening  Health Maintenance Last Pap: 04/16/2012. Results were: Normal Last mammogram: Not indicated Last colonoscopy: Not indicated Last Dexa: Not indicated   Past medical history, past surgical history, family history and social history were all reviewed and documented in the EPIC chart. Works for hospice. 59 yo son, 16 yo daughter, 79 yo son.   ROS:  A ROS was performed and pertinent positives and negatives are included.  Exam:  Vitals:   10/11/22 1400  BP: 138/84  Pulse: (!) 58  Resp: 18  Weight: 223 lb 12.8 oz (101.5 kg)  Height: 5' 2.6" (1.59 m)   Body mass index is 40.16 kg/m.  General appearance:  Normal Thyroid:  Symmetrical, normal in size, without palpable masses or nodularity. Respiratory  Auscultation:  Clear without wheezing or rhonchi Cardiovascular  Auscultation:  Regular rate, without rubs, murmurs or gallops  Edema/varicosities:  Not grossly evident Abdominal  Soft,nontender, without masses, guarding or rebound.  Liver/spleen:  No organomegaly noted  Hernia:  None appreciated  Skin  Inspection:  Grossly normal Breasts: Examined lying and sitting.   Right: Without masses, retractions, nipple discharge or axillary adenopathy.   Left: Without masses, retractions, nipple discharge or axillary  adenopathy. Genitourinary   Inguinal/mons:  Normal without inguinal adenopathy  External genitalia:  Normal appearing vulva with no masses, tenderness, or lesions  BUS/Urethra/Skene's glands:  Normal  Vagina:  Normal appearing with normal color and discharge, no lesions  Cervix:  Normal appearing without discharge or lesions  Uterus:  Normal in size, shape and contour.  Midline and mobile, nontender  Adnexa/parametria:     Rt: Normal in size, without masses or tenderness.   Lt: Normal in size, without masses or tenderness.  Anus and perineum: Normal  Digital rectal exam: Not indicated  Patient informed chaperone available to be present for breast and pelvic exam. Patient has requested no chaperone to be present. Patient has been advised what will be completed during breast and pelvic exam.   Assessment/Plan:  31 y.o. [id number] for annual exam.   Well female exam with routine gynecological exam - Plan: Cytology - PAP( [facility]). Education provided on SBEs, importance of preventative screenings, current guidelines, high calcium diet, regular exercise, and multivitamin daily. Will monitor cycles since she only had one abnormal last month.   Need for immunization against influenza - Plan: Flu Vaccine QUAD 75mo+IM (Fluarix, Fluzone & Alfiuria Quad PF)  Screening for cervical cancer - Normal Pap history but overdue. Pap today.    Return in 1 year for annual.      Theresa Mackie DNP, 2:35 PM 10/11/2022

## 2022-10-12 LAB — CYTOLOGY - PAP
Comment: NEGATIVE
Diagnosis: NEGATIVE
High risk HPV: NEGATIVE

## 2022-11-28 NOTE — L&D Delivery Note (Signed)
Delivery Note At 3:40 PM a viable female was delivered via Vaginal, Spontaneous (Presentation:  ROA ).  APGAR: 9, 9; weight pending.   Placenta status:  spont,  intact.  Cord:   3V with the following complications:  None.  Cord pH: n/a.  Txa given and cytotec placed per rectum for uterine atony.  Anesthesia: Epidural Episiotomy:  None Lacerations:  None Suture Repair:  None Est. Blood Loss (mL):  171  Mom to postpartum.  Baby to Couplet care / Skin to Skin.  Theresa Horne 10/16/2023, 4:06 PM

## 2023-01-10 ENCOUNTER — Ambulatory Visit
Admission: EM | Admit: 2023-01-10 | Discharge: 2023-01-10 | Disposition: A | Discharge status: Home / Self Care | Payer: BC Managed Care – PPO | Attending: Family Medicine | Admitting: Family Medicine

## 2023-01-10 DIAGNOSIS — R102 Pelvic and perineal pain: Secondary | ICD-10-CM | POA: Diagnosis not present

## 2023-01-10 DIAGNOSIS — L292 Pruritus vulvae: Secondary | ICD-10-CM | POA: Diagnosis not present

## 2023-01-10 DIAGNOSIS — Z113 Encounter for screening for infections with a predominantly sexual mode of transmission: Secondary | ICD-10-CM | POA: Diagnosis not present

## 2023-01-10 DIAGNOSIS — R350 Frequency of micturition: Secondary | ICD-10-CM

## 2023-01-10 DIAGNOSIS — B3731 Acute candidiasis of vulva and vagina: Secondary | ICD-10-CM | POA: Diagnosis not present

## 2023-01-10 DIAGNOSIS — N760 Acute vaginitis: Secondary | ICD-10-CM | POA: Insufficient documentation

## 2023-01-10 LAB — POCT URINALYSIS DIP (MANUAL ENTRY)
Bilirubin, UA: NEGATIVE
Blood, UA: NEGATIVE
Glucose, UA: NEGATIVE mg/dL
Ketones, POC UA: NEGATIVE mg/dL
Leukocytes, UA: NEGATIVE
Nitrite, UA: NEGATIVE
Protein Ur, POC: NEGATIVE mg/dL
Spec Grav, UA: >=1.03 — AB (ref 1.010–1.025)
Urobilinogen, UA: 0.2 E.U./dL
pH, UA: 6 (ref 5.0–8.0)

## 2023-01-10 MED ORDER — NITROFURANTOIN MONOHYD MACRO 100 MG PO CAPS: 100.0000 mg | ORAL_CAPSULE | Freq: Two times a day (BID) | ORAL | 0 refills | Status: AC

## 2023-01-10 NOTE — Discharge Instructions (Addendum)
The urinalysis did not have blood or white blood cells or nitrites.  Take nitrofurantoin 100 mg--1 capsule 2 times daily for 5 days  Staff will notify you if there is anything positive on the vaginal swab.  If there is something positive on the swab, then stop taking the nitrofurantoin

## 2023-01-10 NOTE — ED Provider Notes (Signed)
[name]    CSN: [id number] Arrival date & time: 01/10/23  [id number]      History   Chief Complaint Chief Complaint  Patient presents with   Abdominal Pain   Urinary Frequency    HPI Theresa Horne is a 32 y.o. female.    Abdominal Pain Urinary Frequency Associated symptoms include abdominal pain.   Here for suprapubic pain. It began about 3 days ago, and she has had urinary frequency. No dysuria or hematuria. No n/v/d/f/c.   LMP 1/29, on time  Past Medical History:  Diagnosis Date   Hypertension    Preterm labor    STD (sexually transmitted disease)    Chlamydia    Patient Active Problem List   Diagnosis Date Noted   Normal labor and delivery 04/27/2019   Anemia 10/04/2015   Abdominal pressure 06/09/2015   History of preterm delivery, currently pregnant -- delivered at 25 wks in March 2012 06/09/2015   Hydradenitis 04/16/2012    Past Surgical History:  Procedure Laterality Date   INDUCED ABORTION      OB History     Gravida  5   Para  3   Term  2   Preterm  1   AB  1   Living  3      SAB      IAB  1   Ectopic      Multiple  0   Live Births  3            Home Medications    Prior to Admission medications   Medication Sig Start Date End Date Taking? Authorizing Provider  nitrofurantoin, macrocrystal-monohydrate, (MACROBID) 100 MG capsule Take 1 capsule (100 mg total) by mouth 2 (two) times daily for 5 days. 01/10/23 01/15/23 Yes Miu Chiong, Gwenlyn Perking, MD  azelastine (ASTELIN) 0.1 % nasal spray Place 1 spray into both nostrils 2 (two) times daily. Use in each nostril as directed 09/28/22   Mar Daring, PA-C  fluticasone [facility]) 50 MCG/ACT nasal spray Place 2 sprays into both nostrils daily. 08/25/22   Nyoka Lint, PA-C  metoprolol succinate (TOPROL-XL) 25 MG 24 hr tablet Take 1 tablet (25 mg total) by mouth daily. 01/04/21 02/06/21  Elouise Munroe, MD    Family History Family History  Problem Relation  Age of Onset   Hypertension Mother    Diabetes Father    Hypertension Father    Diabetes Paternal Uncle    Renal Disease Maternal Grandmother    Diabetes Paternal Grandfather     Social History Social History   Tobacco Use   Smoking status: Former    Packs/day: 0.00    Types: Cigarettes    Quit date: 01/26/2015    Years since quitting: 7.9   Smokeless tobacco: Never  Vaping Use   Vaping Use: Never used  Substance Use Topics   Alcohol use: Yes    Comment: Occas   Drug use: Yes    Types: Marijuana     Allergies   Patient has no known allergies.   Review of Systems Review of Systems  Gastrointestinal:  Positive for abdominal pain.  Genitourinary:  Positive for frequency.     Physical Exam Triage Vital Signs ED Triage Vitals  Enc Vitals Group     BP 01/10/23 0902 (!) 137/93     Pulse Rate 01/10/23 0902 72     Resp 01/10/23 0902 18     Temp 01/10/23 0902 98.2 F (36.8 C)  Temp Source 01/10/23 0902 Oral     SpO2 01/10/23 0902 97 %     Weight --      Height --      Head Circumference --      Peak Flow --      Pain Score 01/10/23 0901 6     Pain Loc --      Pain Edu? --      Excl. in [place]? --    No data found.  Updated Vital Signs BP (!) 137/93 (BP Location: Left Arm)   Pulse 72   Temp 98.2 F (36.8 C) (Oral)   Resp 18   LMP 12/26/2022   SpO2 97%   Visual Acuity Right Eye Distance:   Left Eye Distance:   Bilateral Distance:    Right Eye Near:   Left Eye Near:    Bilateral Near:     Physical Exam Vitals reviewed.  Constitutional:      General: She is not in acute distress.    Appearance: She is not ill-appearing, toxic-appearing or diaphoretic.  HENT:     Mouth/Throat:     Mouth: Mucous membranes are moist.  Eyes:     Extraocular Movements: Extraocular movements intact.     Pupils: Pupils are equal, round, and reactive to light.  Cardiovascular:     Rate and Rhythm: Normal rate and regular rhythm.     Heart sounds: No murmur  heard. Pulmonary:     Effort: Pulmonary effort is normal.     Breath sounds: Normal breath sounds.  Abdominal:     Palpations: Abdomen is soft.     Tenderness: There is abdominal tenderness (suprapubic).  Musculoskeletal:     Cervical back: Neck supple.  Lymphadenopathy:     Cervical: No cervical adenopathy.  Skin:    Coloration: Skin is not jaundiced or pale.  Neurological:     General: No focal deficit present.     Mental Status: She is alert and oriented to person, place, and time.  Psychiatric:        Behavior: Behavior normal.      UC Treatments / Results  Labs (all labs ordered are listed, but only abnormal results are displayed) Labs Reviewed  POCT URINALYSIS DIP (MANUAL ENTRY) - Abnormal; Notable for the following components:      Result Value   Spec Grav, UA >=1.030 (*)    All other components within normal limits  CERVICOVAGINAL ANCILLARY ONLY    EKG   Radiology No results found.  Procedures Procedures (including critical care time)  Medications Ordered in UC Medications - No data to display  Initial Impression / Assessment and Plan / UC Course  I have reviewed the triage vital signs and the nursing notes.  Pertinent labs & imaging results that were available during my care of the patient were reviewed by me and considered in my medical decision making (see chart for details).        Urinalysis is negative, except it is a little concentrated.  Self swab is done, and staff will notify her of any positives on that.  She states her UTIs are often consistent with current symptoms.  Therefore nitrofurantoin is sent in for 5-day supply.  If she has something positive on the swab, she can stop taking the nitrofurantoin and take an alternate treatment as indicated Final Clinical Impressions(s) / UC Diagnoses   Final diagnoses:  Pelvic pain  Urinary frequency     Discharge Instructions  The urinalysis did not have blood or white blood cells or  nitrites.  Take nitrofurantoin 100 mg--1 capsule 2 times daily for 5 days  Staff will notify you if there is anything positive on the vaginal swab.  If there is something positive on the swab, then stop taking the nitrofurantoin      ED Prescriptions     Medication Sig Dispense Auth. Provider   nitrofurantoin, macrocrystal-monohydrate, (MACROBID) 100 MG capsule Take 1 capsule (100 mg total) by mouth 2 (two) times daily for 5 days. 10 capsule Barrett Henle, MD      PDMP not reviewed this encounter.   Barrett Henle, MD 01/10/23 [id number]

## 2023-01-10 NOTE — ED Triage Notes (Signed)
Pt presents with generalized abdominal pain and urinary frequency X 3 days.

## 2023-01-11 LAB — CERVICOVAGINAL ANCILLARY ONLY
Bacterial Vaginitis (gardnerella): POSITIVE — AB
Candida Glabrata: NEGATIVE
Candida Vaginitis: POSITIVE — AB
Chlamydia: NEGATIVE
Comment: NEGATIVE
Comment: NEGATIVE
Comment: NEGATIVE
Comment: NEGATIVE
Comment: NEGATIVE
Comment: NORMAL
Neisseria Gonorrhea: NEGATIVE
Trichomonas: NEGATIVE

## 2023-01-12 ENCOUNTER — Telehealth (HOSPITAL_COMMUNITY): Payer: Self-pay | Admitting: Emergency Medicine

## 2023-01-12 MED ORDER — FLUCONAZOLE 150 MG PO TABS: 150.0000 mg | ORAL_TABLET | Freq: Once | ORAL | 0 refills | Status: AC

## 2023-01-12 MED ORDER — METRONIDAZOLE 500 MG PO TABS: 500.0000 mg | ORAL_TABLET | Freq: Two times a day (BID) | ORAL | 0 refills | Status: DC

## 2023-02-06 ENCOUNTER — Telehealth: Payer: BC Managed Care – PPO | Admitting: Physician Assistant

## 2023-02-06 DIAGNOSIS — B3731 Acute candidiasis of vulva and vagina: Secondary | ICD-10-CM | POA: Diagnosis not present

## 2023-02-06 MED ORDER — FLUCONAZOLE 150 MG PO TABS: 150.0000 mg | ORAL_TABLET | ORAL | 0 refills | Status: DC | PRN

## 2023-02-06 NOTE — Progress Notes (Signed)
E-Visit for Vaginal Symptoms  We are sorry that you are not feeling well. Here is how we plan to help! Based on what you shared with me it looks like you: May have a yeast vaginosis  Vaginosis is an inflammation of the vagina that can result in discharge, itching and pain. The cause is usually a change in the normal balance of vaginal bacteria or an infection. Vaginosis can also result from reduced estrogen levels after menopause.  The most common causes of vaginosis are:   Bacterial vaginosis which results from an overgrowth of one on several organisms that are normally present in your vagina.   Yeast infections which are caused by a naturally occurring fungus called candida.   Vaginal atrophy (atrophic vaginosis) which results from the thinning of the vagina from reduced estrogen levels after menopause.   Trichomoniasis which is caused by a parasite and is commonly transmitted by sexual intercourse.  Factors that increase your risk of developing vaginosis include: Medications, such as antibiotics and steroids Uncontrolled diabetes Use of hygiene products such as bubble bath, vaginal spray or vaginal deodorant Douching Wearing damp or tight-fitting clothing Using an intrauterine device (IUD) for birth control Hormonal changes, such as those associated with pregnancy, birth control pills or menopause Sexual activity Having a sexually transmitted infection  Your treatment plan is Diflucan (fluconazole) 150mg tablet once, repeat in 72 hours if needed.  I have electronically sent this prescription into the pharmacy that you have chosen.  Be sure to take all of the medication as directed. Stop taking any medication if you develop a rash, tongue swelling or shortness of breath. Mothers who are breast feeding should consider pumping and discarding their breast milk while on these antibiotics. However, there is no consensus that infant exposure at these doses would be harmful.  Remember that  medication creams can weaken latex condoms. .   HOME CARE:  Good hygiene may prevent some types of vaginosis from recurring and may relieve some symptoms:  Avoid baths, hot tubs and whirlpool spas. Rinse soap from your outer genital area after a shower, and dry the area well to prevent irritation. Don't use scented or harsh soaps, such as those with deodorant or antibacterial action. Avoid irritants. These include scented tampons and pads. Wipe from front to back after using the toilet. Doing so avoids spreading fecal bacteria to your vagina.  Other things that may help prevent vaginosis include:  Don't douche. Your vagina doesn't require cleansing other than normal bathing. Repetitive douching disrupts the normal organisms that reside in the vagina and can actually increase your risk of vaginal infection. Douching won't clear up a vaginal infection. Use a latex condom. Both female and female latex condoms may help you avoid infections spread by sexual contact. Wear cotton underwear. Also wear pantyhose with a cotton crotch. If you feel comfortable without it, skip wearing underwear to bed. Yeast thrives in moist environments Your symptoms should improve in the next day or two.  GET HELP RIGHT AWAY IF:  You have pain in your lower abdomen ( pelvic area or over your ovaries) You develop nausea or vomiting You develop a fever Your discharge changes or worsens You have persistent pain with intercourse You develop shortness of breath, a rapid pulse, or you faint.  These symptoms could be signs of problems or infections that need to be evaluated by a medical provider now.  MAKE SURE YOU   Understand these instructions. Will watch your condition. Will get help right away   if you are not doing well or get worse.  Thank you for choosing an e-visit.  Your e-visit answers were reviewed by a board certified advanced clinical practitioner to complete your personal care plan. Depending upon the  condition, your plan could have included both over the counter or prescription medications.  Please review your pharmacy choice. Make sure the pharmacy is open so you can pick up prescription now. If there is a problem, you may contact your provider through MyChart messaging and have the prescription routed to another pharmacy.  Your safety is important to us. If you have drug allergies check your prescription carefully.   For the next 24 hours you can use MyChart to ask questions about today's visit, request a non-urgent call back, or ask for a work or school excuse. You will get an email in the next two days asking about your experience. I hope that your e-visit has been valuable and will speed your recovery.  I have spent 5 minutes in review of e-visit questionnaire, review and updating patient chart, medical decision making and response to patient.   Evolet Salminen M Caelen Reierson, PA-C  

## 2023-02-22 ENCOUNTER — Ambulatory Visit: Payer: BC Managed Care – PPO | Admitting: Family Medicine

## 2023-03-30 DIAGNOSIS — O3680X9 Pregnancy with inconclusive fetal viability, other fetus: Secondary | ICD-10-CM | POA: Diagnosis not present

## 2023-03-30 DIAGNOSIS — N925 Other specified irregular menstruation: Secondary | ICD-10-CM | POA: Diagnosis not present

## 2023-03-30 DIAGNOSIS — Z113 Encounter for screening for infections with a predominantly sexual mode of transmission: Secondary | ICD-10-CM | POA: Diagnosis not present

## 2023-03-30 DIAGNOSIS — Z369 Encounter for antenatal screening, unspecified: Secondary | ICD-10-CM | POA: Diagnosis not present

## 2023-03-30 DIAGNOSIS — O139 Gestational [pregnancy-induced] hypertension without significant proteinuria, unspecified trimester: Secondary | ICD-10-CM | POA: Diagnosis not present

## 2023-03-30 DIAGNOSIS — Z3A09 9 weeks gestation of pregnancy: Secondary | ICD-10-CM | POA: Diagnosis not present

## 2023-03-30 LAB — OB RESULTS CONSOLE ABO/RH: RH Type: POSITIVE

## 2023-03-30 LAB — OB RESULTS CONSOLE ANTIBODY SCREEN: Antibody Screen: NEGATIVE

## 2023-03-30 LAB — OB RESULTS CONSOLE RPR: RPR: NONREACTIVE

## 2023-03-30 LAB — OB RESULTS CONSOLE GC/CHLAMYDIA
Chlamydia: NEGATIVE
Neisseria Gonorrhea: NEGATIVE

## 2023-03-30 LAB — OB RESULTS CONSOLE HEPATITIS B SURFACE ANTIGEN: Hepatitis B Surface Ag: NEGATIVE

## 2023-03-30 LAB — OB RESULTS CONSOLE HIV ANTIBODY (ROUTINE TESTING): HIV: NONREACTIVE

## 2023-03-30 LAB — OB RESULTS CONSOLE RUBELLA ANTIBODY, IGM: Rubella: IMMUNE

## 2023-04-13 DIAGNOSIS — Z3482 Encounter for supervision of other normal pregnancy, second trimester: Secondary | ICD-10-CM | POA: Diagnosis not present

## 2023-04-20 ENCOUNTER — Ambulatory Visit: Payer: BC Managed Care – PPO | Admitting: Family Medicine

## 2023-05-12 ENCOUNTER — Other Ambulatory Visit: Payer: Self-pay | Admitting: Obstetrics and Gynecology

## 2023-05-12 DIAGNOSIS — O99213 Obesity complicating pregnancy, third trimester: Secondary | ICD-10-CM

## 2023-06-08 DIAGNOSIS — Z302 Encounter for sterilization: Secondary | ICD-10-CM | POA: Insufficient documentation

## 2023-06-08 DIAGNOSIS — Z369 Encounter for antenatal screening, unspecified: Secondary | ICD-10-CM | POA: Diagnosis not present

## 2023-06-13 ENCOUNTER — Encounter: Payer: Self-pay | Admitting: *Deleted

## 2023-06-13 DIAGNOSIS — O99210 Obesity complicating pregnancy, unspecified trimester: Secondary | ICD-10-CM | POA: Insufficient documentation

## 2023-06-13 DIAGNOSIS — O09899 Supervision of other high risk pregnancies, unspecified trimester: Secondary | ICD-10-CM | POA: Insufficient documentation

## 2023-06-13 DIAGNOSIS — Z8679 Personal history of other diseases of the circulatory system: Secondary | ICD-10-CM | POA: Insufficient documentation

## 2023-06-19 ENCOUNTER — Ambulatory Visit: Payer: BC Managed Care – PPO | Admitting: *Deleted

## 2023-06-19 ENCOUNTER — Other Ambulatory Visit: Payer: Self-pay | Admitting: Obstetrics and Gynecology

## 2023-06-19 ENCOUNTER — Encounter: Payer: Self-pay | Admitting: *Deleted

## 2023-06-19 ENCOUNTER — Other Ambulatory Visit: Payer: Self-pay | Admitting: *Deleted

## 2023-06-19 ENCOUNTER — Ambulatory Visit: Payer: BC Managed Care – PPO | Attending: Obstetrics and Gynecology

## 2023-06-19 VITALS — BP 142/71 | HR 104

## 2023-06-19 DIAGNOSIS — E669 Obesity, unspecified: Secondary | ICD-10-CM

## 2023-06-19 DIAGNOSIS — Z3A21 21 weeks gestation of pregnancy: Secondary | ICD-10-CM

## 2023-06-19 DIAGNOSIS — O09292 Supervision of pregnancy with other poor reproductive or obstetric history, second trimester: Secondary | ICD-10-CM | POA: Diagnosis not present

## 2023-06-19 DIAGNOSIS — O10012 Pre-existing essential hypertension complicating pregnancy, second trimester: Secondary | ICD-10-CM | POA: Diagnosis not present

## 2023-06-19 DIAGNOSIS — O99213 Obesity complicating pregnancy, third trimester: Secondary | ICD-10-CM | POA: Insufficient documentation

## 2023-06-19 DIAGNOSIS — O99212 Obesity complicating pregnancy, second trimester: Secondary | ICD-10-CM | POA: Diagnosis not present

## 2023-06-19 DIAGNOSIS — O09899 Supervision of other high risk pregnancies, unspecified trimester: Secondary | ICD-10-CM

## 2023-06-19 DIAGNOSIS — O99210 Obesity complicating pregnancy, unspecified trimester: Secondary | ICD-10-CM

## 2023-06-19 DIAGNOSIS — O09212 Supervision of pregnancy with history of pre-term labor, second trimester: Secondary | ICD-10-CM | POA: Diagnosis not present

## 2023-06-19 DIAGNOSIS — O10912 Unspecified pre-existing hypertension complicating pregnancy, second trimester: Secondary | ICD-10-CM

## 2023-06-19 DIAGNOSIS — Z8679 Personal history of other diseases of the circulatory system: Secondary | ICD-10-CM | POA: Insufficient documentation

## 2023-06-23 ENCOUNTER — Encounter (HOSPITAL_COMMUNITY): Payer: Self-pay | Admitting: Obstetrics and Gynecology

## 2023-06-23 ENCOUNTER — Inpatient Hospital Stay (HOSPITAL_COMMUNITY)
Admission: AD | Admit: 2023-06-23 | Discharge: 2023-06-23 | Disposition: A | Discharge status: Home / Self Care | Payer: BC Managed Care – PPO | Attending: Obstetrics and Gynecology | Admitting: Obstetrics and Gynecology

## 2023-06-23 DIAGNOSIS — Z3A21 21 weeks gestation of pregnancy: Secondary | ICD-10-CM | POA: Diagnosis not present

## 2023-06-23 DIAGNOSIS — R0602 Shortness of breath: Secondary | ICD-10-CM | POA: Insufficient documentation

## 2023-06-23 DIAGNOSIS — O26892 Other specified pregnancy related conditions, second trimester: Secondary | ICD-10-CM | POA: Insufficient documentation

## 2023-06-23 DIAGNOSIS — R519 Headache, unspecified: Secondary | ICD-10-CM | POA: Diagnosis not present

## 2023-06-23 DIAGNOSIS — R0781 Pleurodynia: Secondary | ICD-10-CM | POA: Insufficient documentation

## 2023-06-23 DIAGNOSIS — O09212 Supervision of pregnancy with history of pre-term labor, second trimester: Secondary | ICD-10-CM | POA: Insufficient documentation

## 2023-06-23 LAB — CBC
HCT: 32.5 % — ABNORMAL LOW (ref 36.0–46.0)
Hemoglobin: 10.7 g/dL — ABNORMAL LOW (ref 12.0–15.0)
MCH: 29.4 pg (ref 26.0–34.0)
MCHC: 32.9 g/dL (ref 30.0–36.0)
MCV: 89.3 fL (ref 80.0–100.0)
Platelets: 167 10*3/uL (ref 150–400)
RBC: 3.64 MIL/uL — ABNORMAL LOW (ref 3.87–5.11)
RDW: 13.4 % (ref 11.5–15.5)
WBC: 10.6 10*3/uL — ABNORMAL HIGH (ref 4.0–10.5)
nRBC: 0 % (ref 0.0–0.2)

## 2023-06-23 LAB — D-DIMER, QUANTITATIVE: D-Dimer, Quant: 1.1 ug/mL-FEU — ABNORMAL HIGH (ref 0.00–0.50)

## 2023-06-23 LAB — COMPREHENSIVE METABOLIC PANEL
ALT: 23 U/L (ref 0–44)
AST: 20 U/L (ref 15–41)
Albumin: 3 g/dL — ABNORMAL LOW (ref 3.5–5.0)
Alkaline Phosphatase: 45 U/L (ref 38–126)
Anion gap: 8 (ref 5–15)
BUN: 6 mg/dL (ref 6–20)
CO2: 22 mmol/L (ref 22–32)
Calcium: 9.6 mg/dL (ref 8.9–10.3)
Chloride: 104 mmol/L (ref 98–111)
Creatinine, Ser: 0.89 mg/dL (ref 0.44–1.00)
GFR, Estimated: >60 mL/min (ref >60)
Glucose, Bld: 86 mg/dL (ref 70–99)
Potassium: 4 mmol/L (ref 3.5–5.1)
Sodium: 134 mmol/L — ABNORMAL LOW (ref 135–145)
Total Bilirubin: 0.2 mg/dL — ABNORMAL LOW (ref 0.3–1.2)
Total Protein: 7 g/dL (ref 6.5–8.1)

## 2023-06-23 LAB — TROPONIN I (HIGH SENSITIVITY): Troponin I (High Sensitivity): 7 ng/L (ref <18)

## 2023-06-23 LAB — LIPASE, BLOOD: Lipase: 28 U/L (ref 11–51)

## 2023-06-23 MED ORDER — ACETAMINOPHEN 500 MG PO TABS
1000.0000 mg | ORAL_TABLET | Freq: Once | ORAL | Status: AC
  Administered 2023-06-23: 1000 mg via ORAL
  Filled 2023-06-23: qty 2

## 2023-06-23 NOTE — MAU Note (Signed)
Pt says she was having chest pain under right breast - started at 1pm- 5/10-  She layed in bed and went to sleep- woke at 3pm- went to b-room-hurt to breathe - she layed back down- and ate. Then at 6 pm- she called her sister- told her to come here.  But then 640pm - on way here- less pain- she went to [facility] ER-they told her to come here  Now - no pain - but feels light-headed and H/A 3/10 Took 1 Tum at 3pm [facility]

## 2023-06-23 NOTE — MAU Provider Note (Signed)
Obstetric Attending MAU Note  Chief Complaint:  Headache   Event Date/Time   First Provider Initiated Contact with Patient 06/23/23 2017     HPI: Theresa Horne is a 32 y.o. [id number] at [redacted]w[redacted]d who presents to maternity admissions reporting chest pain, left sided and worse with deep inspiration. Onset while lying flat. Lasted about 5 hours and resolved without intervention. Having headache as well today.. Denies contractions, leakage of fluid or vaginal bleeding. Good fetal movement.   Pregnancy Course: Receives care at [facility] Patient Active Problem List   Diagnosis Date Noted   History of preterm delivery, currently pregnant 06/13/2023   Obesity affecting pregnancy 06/13/2023   History of hypertension 06/13/2023    Past Medical History:  Diagnosis Date   Hypertension    Preterm labor    STD (sexually transmitted disease)    Chlamydia    OB History  Gravida Para Term Preterm AB Living  6 3 2 1 2 3   SAB IAB Ectopic Multiple Live Births  1 1   0 3    # Outcome Date GA Lbr Len/2nd Weight Sex Type Anes PTL Lv  6 Current           5 Term 04/27/19 [redacted]w[redacted]d 14:00 / 00:14 3490 g M Vag-Spont EPI  LIV     Birth Comments: WNL  4 Term 10/03/15 [redacted]w[redacted]d 02:29 / 00:02 2845 g F Vag-Spont None  LIV  3 Preterm 02/19/11 [redacted]w[redacted]d   M Vag-Spont   LIV  2 IAB 2008          1 SAB             Past Surgical History:  Procedure Laterality Date   INDUCED ABORTION      Family History: Family History  Problem Relation Age of Onset   Hypertension Mother    Diabetes Father    Hypertension Father    Diabetes Paternal Uncle    Renal Disease Maternal Grandmother    Diabetes Paternal Grandfather     Social History: Social History   Tobacco Use   Smoking status: Former    Current packs/day: 0.00    Types: Cigarettes    Quit date: 01/26/2015    Years since quitting: 8.4   Smokeless tobacco: Never  Vaping Use   Vaping status: Never Used  Substance Use Topics   Alcohol use: Yes    Comment:  Occas   Drug use: Yes    Types: Marijuana    Comment: LAST USED jAN 2024    Allergies: No Known Allergies  Medications Prior to Admission  Medication Sig Dispense Refill Last Dose   calcium carbonate (TUMS EX) 750 MG chewable tablet Chew 1 tablet by mouth daily.   06/23/2023   azelastine (ASTELIN) 0.1 % nasal spray Place 1 spray into both nostrils 2 (two) times daily. Use in each nostril as directed 30 mL 0     ROS: Pertinent findings in history of present illness.  Physical Exam  Blood pressure 125/70, pulse (!) 49, temperature 98.2 F (36.8 C), temperature source Oral, resp. rate 16, height 5\' 3"  (1.6 m), weight 111.6 kg, last menstrual period 01/23/2023, SpO2 98%. CONSTITUTIONAL: Well-developed, well-nourished female in no acute distress.  HENT:  Normocephalic, atraumatic, External right and left ear normal. Oropharynx is clear and moist EYES: Conjunctivae and EOM are normal. Pupils are equal, round, and reactive to light. No scleral icterus.  NECK: Normal range of motion, supple, no masses SKIN: Skin is warm and dry. No rash  noted. Not diaphoretic. No erythema. No pallor. NEUROLGIC: Alert and oriented to person, place, and time. Normal reflexes, muscle tone coordination. No cranial nerve deficit noted. PSYCHIATRIC: Normal mood and affect. Normal behavior. Normal judgment and thought content. CARDIOVASCULAR: Normal heart rate noted, regular rhythm RESPIRATORY: Effort and breath sounds normal, no problems with respiration noted ABDOMEN: Soft, nontender, nondistended, gravid appropriate for gestational age MUSCULOSKELETAL: Normal range of motion. No edema and no tenderness. 2+ distal pulses.  SPECULUM EXAM: NEFG, physiologic discharge, no blood, cervix clean   Doppler + FHT   Labs: Results for orders placed or performed during the hospital encounter of 06/23/23 (from the past 24 hour(s))  CBC     Status: Abnormal   Collection Time: 06/23/23  8:57 PM  Result Value Ref Range    WBC 10.6 (H) 4.0 - 10.5 K/uL   RBC 3.64 (L) 3.87 - 5.11 MIL/uL   Hemoglobin 10.7 (L) 12.0 - 15.0 g/dL   HCT 21.3 (L) 08.6 - 57.8 %   MCV 89.3 80.0 - 100.0 fL   MCH 29.4 26.0 - 34.0 pg   MCHC 32.9 30.0 - 36.0 g/dL   RDW 46.9 62.9 - 52.8 %   Platelets 167 150 - 400 K/uL   nRBC 0.0 0.0 - 0.2 %  Comprehensive metabolic panel     Status: Abnormal   Collection Time: 06/23/23  8:57 PM  Result Value Ref Range   Sodium 134 (L) 135 - 145 mmol/L   Potassium 4.0 3.5 - 5.1 mmol/L   Chloride 104 98 - 111 mmol/L   CO2 22 22 - 32 mmol/L   Glucose, Bld 86 70 - 99 mg/dL   BUN 6 6 - 20 mg/dL   Creatinine, Ser 4.13 0.44 - 1.00 mg/dL   Calcium 9.6 8.9 - 24.4 mg/dL   Total Protein 7.0 6.5 - 8.1 g/dL   Albumin 3.0 (L) 3.5 - 5.0 g/dL   AST 20 15 - 41 U/L   ALT 23 0 - 44 U/L   Alkaline Phosphatase 45 38 - 126 U/L   Total Bilirubin 0.2 (L) 0.3 - 1.2 mg/dL   GFR, Estimated >01 >02 mL/min   Anion gap 8 5 - 15  Troponin I (High Sensitivity)     Status: None   Collection Time: 06/23/23  8:57 PM  Result Value Ref Range   Troponin I (High Sensitivity) 7 <18 ng/L  Lipase, blood     Status: None   Collection Time: 06/23/23  8:57 PM  Result Value Ref Range   Lipase 28 11 - 51 U/L  D-dimer, quantitative     Status: Abnormal   Collection Time: 06/23/23  8:57 PM  Result Value Ref Range   D-Dimer, Quant 1.10 (H) 0.00 - 0.50 ug/mL-FEU   EKG -sinus brady, no ST-Twave changes Imaging:  [name] MFM OB DETAIL +14 WK  Result Date: 06/19/2023 ----------------------------------------------------------------------  OBSTETRICS REPORT                       (Signed Final 06/19/2023 12:08 pm) ---------------------------------------------------------------------- Patient Info  ID #:       [id number]                          D.O.B.:  1991/05/05 (31 yrs)  Name:       Theresa Horne             Visit Date: 06/19/2023 09:49 am ---------------------------------------------------------------------- Performed By  Attending:  Noralee Space MD        Ref. Address:     [facility]                                                             Gynecology                                                             [address]                                                             [address]                                                             [place], [place]                                                             [address]  Performed By:     Palma Holter         [facility]                                     [facility]                                                             [facility]  Referred By:      Nigel Bridgeman                    CNM ---------------------------------------------------------------------- Orders  #  Description                           Code        Ordered By  1  [name] MFM OB DETAIL +14 WK               76811.01    Nigel Bridgeman  2  [name] MFM OB TRANSVAGINAL                [phone]     Nigel Bridgeman ----------------------------------------------------------------------  #  Order #                     Accession #                Episode #  1  [id number]                   [id number]                 [id number]  2  [id number]                   [id number]                 [id number] ---------------------------------------------------------------------- Indications  Obesity complicating pregnancy, second         O99.212  trimester (PG BMI - 41)  Pre-existing essential hypertension            O10.012  complicating pregnancy, second trimester  Poor obstetric history: Previous preterm       O09.219  delivery, antepartum (26 wks)  Poor obstetric history: Previous gestational   O09.299  HTN  [redacted] weeks gestation of pregnancy                Z3A.21  LR NIPT, no record of AFP  or Horizon  Encounter for antenatal screening for          Z36.3  malformations ---------------------------------------------------------------------- Vital Signs  BP:          142/71 ---------------------------------------------------------------------- Fetal Evaluation  Num Of Fetuses:         1  Fetal Heart Rate(bpm):  149  Cardiac Activity:       Observed  Presentation:           Cephalic  Placenta:               Posterior  P. Cord Insertion:      Visualized, central  Amniotic Fluid  AFI FV:      Within normal limits                              Largest Pocket(cm)                              3.83 ---------------------------------------------------------------------- Biometry  BPD:      47.7  mm     G. Age:  20w 3d         26  %    CI:        69.79   %    70 - 86  FL/HC:      19.5   %    15.9 - 20.3  HC:      182.2  mm     G. Age:  20w 4d         24  %    HC/AC:      1.13        1.06 - 1.25  AC:      161.2  mm     G. Age:  21w 1d         50  %    FL/BPD:     74.6   %  FL:       35.6  mm     G. Age:  21w 2d         50  %    FL/AC:      22.1   %    20 - 24  HUM:        31  mm     G. Age:  20w 2d         28  %  CER:      21.7  mm     G. Age:  20w 3d         43  %  NFT:       3.3  mm  LV:        5.6  mm  CM:        4.4  mm  Est. FW:     402  gm    0 lb 14 oz      52  % ---------------------------------------------------------------------- OB History  Gravidity:    6         Term:   3        Prem:   1        SAB:   0  TOP:          1       Ectopic:  0        Living: 3 ---------------------------------------------------------------------- Gestational Age  LMP:           21w 0d        Date:  01/23/23                  EDD:   10/30/23  U/S Today:     20w 6d                                        EDD:   10/31/23  Best:          [place] 0d     Det. By:  LMP  (01/23/23)          EDD:   10/30/23 ---------------------------------------------------------------------- Anatomy   Cranium:               Appears normal         Aortic Arch:            Appears normal  Cavum:                 Appears normal         Ductal Arch:            Appears normal  Ventricles:            Appears normal         Diaphragm:  Appears normal  Choroid Plexus:        Appears normal         Stomach:                Appears normal, left                                                                        sided  Cerebellum:            Appears normal         Abdomen:                Appears normal  Posterior Fossa:       Appears normal         Abdominal Wall:         Appears nml (cord                                                                        insert, abd wall)  Nuchal Fold:           Appears normal         Cord Vessels:           Appears normal (3                                                                        vessel cord)  Face:                  Appears normal         Kidneys:                Appear normal                         (orbits and profile)  Lips:                  Appears normal         Bladder:                Appears normal  Thoracic:              Appears normal         Spine:                  Appears normal  Heart:                 Appears normal; EIF    Upper Extremities:      Appears normal  RVOT:                  Appears normal         Lower  Extremities:      Appears normal                         (clip)  LVOT:                  Appears normal                         (clip)  Other:  Heels/feet and Lt open hand/5th digit visualized. Nasal bone, lenses,          maxilla, mandible and falx visualized. VC, 3VV and 3VTV visualized.          Technically difficult due to maternal habitus and fetal position. ---------------------------------------------------------------------- Cervix Uterus Adnexa  Cervix  Length:            4.5  cm.  Normal appearance by transabdominal scan  Uterus  No abnormality visualized.  Right Ovary  Size(cm)     2.36   x   3.25   x  1.58      Vol(ml): 6.35   Within normal limits.  Left Ovary  Size(cm)     2.83   x   2.85   x  1.88      Vol(ml): 7.94  Within normal limits.  Cul De Sac  No free fluid seen.  Adnexa  No abnormality visualized ---------------------------------------------------------------------- Impression  G6 U2725.  Patient is here for fetal anatomy scan. On cell-  free fetal DNA screening, the risks of aneuploidies are not  increased.  Obstetrical history is significant for a spontaneous preterm  birth at [redacted] weeks gestation.  Subsequently, she had 2 term  vaginal deliveries.  In both pregnancies she took  intramuscular progesterone injections.  Patient is aware that  17-alpha hydroxyprogesterone preparation has been  withdrawn from the market and does not have an FDA  approval for treatment of preterm delivery.  She has a diagnosis of chronic hypertension that is well-  controlled without antihypertensives.  Blood pressure today at  our office is 142/71 mmHg.  We performed fetal anatomy scan. No makers of  aneuploidies or fetal structural defects are seen. Fetal  biometry is consistent with her previously-established dates.  Amniotic fluid is normal and good fetal activity is seen.  Patient understands the limitations of ultrasound in detecting  fetal anomalies.  Because of her history of preterm delivery, we performed  transvaginal ultrasound to evaluate the cervix.  The cervix  measures 4.5 cm, which is normal.  Vaginal progesterone to prevent preterm delivery is  controversial.  Cervical length measurements help in making  reasonable decisions.  I informed the patient that if cervical  length is between 2.5 to 3 cm, vaginal progesterone may be  considered. Alternatively, she can opt to take vaginal  progesterone b based on history of preterm delivery.  I reassured the patient of normal cervical length  measurement on today's ultrasound.  Patient is undecided  about vaginal progesterone treatment.  ---------------------------------------------------------------------- Recommendations  -An appointment was made for her to return in 4 weeks for  fetal growth assessment.  -Fetal growth assessments every 4 weeks. ----------------------------------------------------------------------                 Noralee Space, MD Electronically Signed Final Report   06/19/2023 12:08 pm ----------------------------------------------------------------------   [name] MFM OB Transvaginal  Result Date: 06/19/2023 ----------------------------------------------------------------------  OBSTETRICS REPORT                       (  Signed Final 06/19/2023 12:08 pm) ---------------------------------------------------------------------- Patient Info  ID #:       [id number]                          D.O.B.:  1991/07/25 (31 yrs)  Name:       Theresa Horne             Visit Date: 06/19/2023 09:49 am ---------------------------------------------------------------------- Performed By  Attending:        Noralee Space MD        Ref. Address:     [facility] &                                                             Gynecology                                                             [address]                                                             [address]                                                             [place], [place]                                                             [address]  Performed By:     Palma Holter         [facility]  Referred By:      Nigel Bridgeman                    CNM  ---------------------------------------------------------------------- Orders  #  Description                           Code        Ordered By  1  [name] MFM OB DETAIL +14 WK               76811.01    Nigel Bridgeman  2  [name] MFM OB TRANSVAGINAL                76817.2     [facility] ----------------------------------------------------------------------  #  Order #                     Accession #                Episode #  1  [id number]                   [id number]                 [id number]  2  [id number]                   [id number]                 [id number] ---------------------------------------------------------------------- Indications  Obesity complicating pregnancy, second         O99.212  trimester (PG BMI - 41)  Pre-existing essential hypertension            O10.012  complicating pregnancy, second trimester  Poor obstetric history: Previous preterm       O09.219  delivery, antepartum (26 wks)  Poor obstetric history: Previous gestational   O09.299  HTN  [redacted] weeks gestation of pregnancy                Z3A.21  LR NIPT, no record of AFP or Horizon  Encounter for antenatal screening for          Z36.3  malformations ---------------------------------------------------------------------- Vital Signs  BP:          142/71 ---------------------------------------------------------------------- Fetal Evaluation  Num Of Fetuses:         1  Fetal Heart Rate(bpm):  149  Cardiac Activity:       Observed  Presentation:           Cephalic  Placenta:               Posterior  P. Cord Insertion:      Visualized, central  Amniotic Fluid  AFI FV:  Within normal limits                              Largest Pocket(cm)                              3.83 ---------------------------------------------------------------------- Biometry  BPD:      47.7  mm     G. Age:  20w 3d         26  %    CI:        69.79   %    70 - 86                                                          FL/HC:      19.5   %    15.9 - 20.3  HC:      182.2  mm     G. Age:   20w 4d         24  %    HC/AC:      1.13        1.06 - 1.25  AC:      161.2  mm     G. Age:  21w 1d         50  %    FL/BPD:     74.6   %  FL:       35.6  mm     G. Age:  21w 2d         50  %    FL/AC:      22.1   %    20 - 24  HUM:        31  mm     G. Age:  20w 2d         28  %  CER:      21.7  mm     G. Age:  20w 3d         43  %  NFT:       3.3  mm  LV:        5.6  mm  CM:        4.4  mm  Est. FW:     402  gm    0 lb 14 oz      52  % ---------------------------------------------------------------------- OB History  Gravidity:    6         Term:   3        Prem:   1        SAB:   0  TOP:          1       Ectopic:  0        Living: 3 ---------------------------------------------------------------------- Gestational Age  LMP:           21w 0d        Date:  01/23/23                  EDD:   10/30/23  U/S Today:     20w 6d  EDD:   10/31/23  Best:          [place] 0d     Det. By:  LMP  (01/23/23)          EDD:   10/30/23 ---------------------------------------------------------------------- Anatomy  Cranium:               Appears normal         Aortic Arch:            Appears normal  Cavum:                 Appears normal         Ductal Arch:            Appears normal  Ventricles:            Appears normal         Diaphragm:              Appears normal  Choroid Plexus:        Appears normal         Stomach:                Appears normal, left                                                                        sided  Cerebellum:            Appears normal         Abdomen:                Appears normal  Posterior Fossa:       Appears normal         Abdominal Wall:         Appears nml (cord                                                                        insert, abd wall)  Nuchal Fold:           Appears normal         Cord Vessels:           Appears normal (3                                                                        vessel cord)  Face:                  Appears normal          Kidneys:                Appear normal                         (  orbits and profile)  Lips:                  Appears normal         Bladder:                Appears normal  Thoracic:              Appears normal         Spine:                  Appears normal  Heart:                 Appears normal; EIF    Upper Extremities:      Appears normal  RVOT:                  Appears normal         Lower Extremities:      Appears normal                         (clip)  LVOT:                  Appears normal                         (clip)  Other:  Heels/feet and Lt open hand/5th digit visualized. Nasal bone, lenses,          maxilla, mandible and falx visualized. VC, 3VV and 3VTV visualized.          Technically difficult due to maternal habitus and fetal position. ---------------------------------------------------------------------- Cervix Uterus Adnexa  Cervix  Length:            4.5  cm.  Normal appearance by transabdominal scan  Uterus  No abnormality visualized.  Right Ovary  Size(cm)     2.36   x   3.25   x  1.58      Vol(ml): 6.35  Within normal limits.  Left Ovary  Size(cm)     2.83   x   2.85   x  1.88      Vol(ml): 7.94  Within normal limits.  Cul De Sac  No free fluid seen.  Adnexa  No abnormality visualized ---------------------------------------------------------------------- Impression  G6 W0981.  Patient is here for fetal anatomy scan. On cell-  free fetal DNA screening, the risks of aneuploidies are not  increased.  Obstetrical history is significant for a spontaneous preterm  birth at [redacted] weeks gestation.  Subsequently, she had 2 term  vaginal deliveries.  In both pregnancies she took  intramuscular progesterone injections.  Patient is aware that  17-alpha hydroxyprogesterone preparation has been  withdrawn from the market and does not have an FDA  approval for treatment of preterm delivery.  She has a diagnosis of chronic hypertension that is well-  controlled without antihypertensives.  Blood pressure today at   our office is 142/71 mmHg.  We performed fetal anatomy scan. No makers of  aneuploidies or fetal structural defects are seen. Fetal  biometry is consistent with her previously-established dates.  Amniotic fluid is normal and good fetal activity is seen.  Patient understands the limitations of ultrasound in detecting  fetal anomalies.  Because of her history of preterm delivery, we performed  transvaginal ultrasound to evaluate the cervix.  The cervix  measures 4.5 cm, which is normal.  Vaginal progesterone to prevent preterm  delivery is  controversial.  Cervical length measurements help in making  reasonable decisions.  I informed the patient that if cervical  length is between 2.5 to 3 cm, vaginal progesterone may be  considered. Alternatively, she can opt to take vaginal  progesterone b based on history of preterm delivery.  I reassured the patient of normal cervical length  measurement on today's ultrasound.  Patient is undecided  about vaginal progesterone treatment. ---------------------------------------------------------------------- Recommendations  -An appointment was made for her to return in 4 weeks for  fetal growth assessment.  -Fetal growth assessments every 4 weeks. ----------------------------------------------------------------------                 Noralee Space, MD Electronically Signed Final Report   06/19/2023 12:08 pm ----------------------------------------------------------------------    MAU Course: Labs and EKG done, interpreted by me Labs show no evidence of heart disease.  No obvious DVT based on D-dimer result, normal level in pregnancy. No hemoptysis Recent URI Tylenol given and Headache improved  Assessment: 1. Pleuritic chest pain   2. [redacted] weeks gestation of pregnancy   3. Shortness of breath due to pregnancy in second trimester   4. Pregnancy headache in second trimester     Plan: Discharge home Labor precautions and fetal kick counts reviewed Tylenol as needed for  pain Follow up with OB provider   Follow-up Information     [facility] Follow up.   Specialty: Obstetrics and Gynecology Contact information: [address] [place] [address] [phone]                Allergies as of 06/23/2023   No Known Allergies      Medication List     TAKE these medications    azelastine 0.1 % nasal spray Commonly known as: ASTELIN Place 1 spray into both nostrils 2 (two) times daily. Use in each nostril as directed   calcium carbonate 750 MG chewable tablet Commonly known as: TUMS EX Chew 1 tablet by mouth daily.        Reva Bores, MD 06/23/2023 10:32 PM

## 2023-07-06 DIAGNOSIS — B349 Viral infection, unspecified: Secondary | ICD-10-CM | POA: Diagnosis not present

## 2023-07-06 DIAGNOSIS — Z369 Encounter for antenatal screening, unspecified: Secondary | ICD-10-CM | POA: Diagnosis not present

## 2023-07-07 DIAGNOSIS — D6949 Other primary thrombocytopenia: Secondary | ICD-10-CM | POA: Insufficient documentation

## 2023-07-07 DIAGNOSIS — O99019 Anemia complicating pregnancy, unspecified trimester: Secondary | ICD-10-CM | POA: Insufficient documentation

## 2023-07-21 ENCOUNTER — Other Ambulatory Visit: Payer: Self-pay | Admitting: *Deleted

## 2023-07-21 ENCOUNTER — Ambulatory Visit: Payer: BC Managed Care – PPO | Admitting: *Deleted

## 2023-07-21 ENCOUNTER — Ambulatory Visit: Payer: BC Managed Care – PPO | Attending: Obstetrics and Gynecology

## 2023-07-21 VITALS — BP 136/65 | HR 48

## 2023-07-21 DIAGNOSIS — O99212 Obesity complicating pregnancy, second trimester: Secondary | ICD-10-CM | POA: Diagnosis not present

## 2023-07-21 DIAGNOSIS — O09899 Supervision of other high risk pregnancies, unspecified trimester: Secondary | ICD-10-CM | POA: Diagnosis not present

## 2023-07-21 DIAGNOSIS — E669 Obesity, unspecified: Secondary | ICD-10-CM

## 2023-07-21 DIAGNOSIS — O365990 Maternal care for other known or suspected poor fetal growth, unspecified trimester, not applicable or unspecified: Secondary | ICD-10-CM | POA: Insufficient documentation

## 2023-07-21 DIAGNOSIS — O09212 Supervision of pregnancy with history of pre-term labor, second trimester: Secondary | ICD-10-CM | POA: Diagnosis not present

## 2023-07-21 DIAGNOSIS — Z3A25 25 weeks gestation of pregnancy: Secondary | ICD-10-CM

## 2023-07-21 DIAGNOSIS — O10912 Unspecified pre-existing hypertension complicating pregnancy, second trimester: Secondary | ICD-10-CM | POA: Diagnosis not present

## 2023-07-21 DIAGNOSIS — O10012 Pre-existing essential hypertension complicating pregnancy, second trimester: Secondary | ICD-10-CM

## 2023-07-21 DIAGNOSIS — O09292 Supervision of pregnancy with other poor reproductive or obstetric history, second trimester: Secondary | ICD-10-CM

## 2023-07-26 ENCOUNTER — Encounter: Payer: Self-pay | Admitting: *Deleted

## 2023-07-26 ENCOUNTER — Ambulatory Visit
Admission: EM | Admit: 2023-07-26 | Discharge: 2023-07-26 | Disposition: A | Discharge status: Home / Self Care | Payer: BC Managed Care – PPO | Attending: Internal Medicine | Admitting: Internal Medicine

## 2023-07-26 DIAGNOSIS — N898 Other specified noninflammatory disorders of vagina: Secondary | ICD-10-CM | POA: Diagnosis present

## 2023-07-26 DIAGNOSIS — Z113 Encounter for screening for infections with a predominantly sexual mode of transmission: Secondary | ICD-10-CM | POA: Diagnosis present

## 2023-07-26 NOTE — ED Provider Notes (Signed)
[name]    CSN: [id number] Arrival date & time: 07/26/23  0804      History   Chief Complaint Chief Complaint  Patient presents with   Vaginal Irritation    [redacted] wks pregnant    HPI Theresa Horne is a 32 y.o. female.   Patient presents with vaginal itching that has been present intermittently for the past 2 weeks.  Patient denies vaginal discharge.  Reports that she called her OB/GYN as she is currently [redacted] weeks pregnant who prescribed her a topical cream for symptoms.  She is not sure the name of this medication but states that she never picked it up as symptoms seemed to resolved.  Symptoms returned a few days ago.  Denies any exposure or concern for STD.     Past Medical History:  Diagnosis Date   Hypertension    Preterm labor    STD (sexually transmitted disease)    Chlamydia    Patient Active Problem List   Diagnosis Date Noted   History of preterm delivery, currently pregnant 06/13/2023   Obesity affecting pregnancy 06/13/2023   History of hypertension 06/13/2023    Past Surgical History:  Procedure Laterality Date   INDUCED ABORTION      OB History     Gravida  6   Para  3   Term  2   Preterm  1   AB  2   Living  3      SAB  1   IAB  1   Ectopic      Multiple  0   Live Births  3            Home Medications    Prior to Admission medications   Medication Sig Start Date End Date Taking? Authorizing Provider  azelastine (ASTELIN) 0.1 % nasal spray Place 1 spray into both nostrils 2 (two) times daily. Use in each nostril as directed 09/28/22   Margaretann Loveless, PA-C  calcium carbonate (TUMS EX) 750 MG chewable tablet Chew 1 tablet by mouth daily.    [provider]  metoprolol succinate (TOPROL-XL) 25 MG 24 hr tablet Take 1 tablet (25 mg total) by mouth daily. 01/04/21 02/06/21  Parke Poisson, MD    Family History Family History  Problem Relation Age of Onset   Hypertension Mother     Diabetes Father    Hypertension Father    Diabetes Paternal Uncle    Renal Disease Maternal Grandmother    Diabetes Paternal Grandfather     Social History Social History   Tobacco Use   Smoking status: Former    Current packs/day: 0.00    Types: Cigarettes    Quit date: 01/26/2015    Years since quitting: 8.5   Smokeless tobacco: Never  Vaping Use   Vaping status: Never Used  Substance Use Topics   Alcohol use: Not Currently   Drug use: Not Currently    Types: Marijuana     Allergies   Patient has no known allergies.   Review of Systems Review of Systems Per HPI  Physical Exam Triage Vital Signs ED Triage Vitals  Encounter Vitals Group     BP 07/26/23 0812 116/70     Systolic BP Percentile --      Diastolic BP Percentile --      Pulse Rate 07/26/23 0812 (!) 51     Resp 07/26/23 0812 16     Temp 07/26/23 0812 98.1 F (36.7 C)  Temp Source 07/26/23 0812 Oral     SpO2 07/26/23 0812 97 %     Weight --      Height --      Head Circumference --      Peak Flow --      Pain Score 07/26/23 0814 0     Pain Loc --      Pain Education --      Exclude from Growth Chart --    No data found.  Updated Vital Signs BP 116/70   Pulse (!) 51 Comment: pt states normal HR for her  Temp 98.1 F (36.7 C) (Oral)   Resp 16   LMP 01/23/2023   SpO2 97%   Visual Acuity Right Eye Distance:   Left Eye Distance:   Bilateral Distance:    Right Eye Near:   Left Eye Near:    Bilateral Near:     Physical Exam Constitutional:      General: She is not in acute distress.    Appearance: Normal appearance. She is not toxic-appearing or diaphoretic.  HENT:     Head: Normocephalic and atraumatic.  Eyes:     Extraocular Movements: Extraocular movements intact.     Conjunctiva/sclera: Conjunctivae normal.  Pulmonary:     Effort: Pulmonary effort is normal.  Genitourinary:    Comments: Deferred with shared decision making. Self swab performed.  Neurological:      General: No focal deficit present.     Mental Status: She is alert and oriented to person, place, and time. Mental status is at baseline.  Psychiatric:        Mood and Affect: Mood normal.        Behavior: Behavior normal.        Thought Content: Thought content normal.        Judgment: Judgment normal.      UC Treatments / Results  Labs (all labs ordered are listed, but only abnormal results are displayed) Labs Reviewed  CERVICOVAGINAL ANCILLARY ONLY    EKG   Radiology No results found.  Procedures Procedures (including critical care time)  Medications Ordered in UC Medications - No data to display  Initial Impression / Assessment and Plan / UC Course  I have reviewed the triage vital signs and the nursing notes.  Pertinent labs & imaging results that were available during my care of the patient were reviewed by me and considered in my medical decision making (see chart for details).     Cervicovaginal swab pending.  Given patient is currently pregnant, will await results prior to any treatment.  Patient requested STD testing as well so this is pending on vaginal swab.  Advised to follow-up with OB/GYN.  Patient verbalized understanding and was agreeable with plan. Final Clinical Impressions(s) / UC Diagnoses   Final diagnoses:  Vaginal itching  Screening examination for venereal disease     Discharge Instructions      Vaginal swab is pending.  We will call when it results and send any appropriate treatment.  Please follow-up with OB/GYN for any further concerns.    ED Prescriptions   None    PDMP not reviewed this encounter.   Gustavus Bryant, [place] 07/26/23 [phone]

## 2023-07-26 NOTE — Discharge Instructions (Signed)
Vaginal swab is pending.  We will call when it results and send any appropriate treatment.  Please follow-up with OB/GYN for any further concerns.

## 2023-07-26 NOTE — ED Triage Notes (Signed)
Pt is [redacted] wks pregnant. C/O vaginal irritation without discharge onset approx 2 wks ago. States OB sent in Rx for "yeast cream", but pt never filled; states she wants to make sure there is not a bacterial infection.

## 2023-07-27 ENCOUNTER — Telehealth (HOSPITAL_COMMUNITY): Payer: Self-pay | Admitting: Emergency Medicine

## 2023-07-27 LAB — CERVICOVAGINAL ANCILLARY ONLY
Bacterial Vaginitis (gardnerella): POSITIVE — AB
Candida Glabrata: NEGATIVE
Candida Vaginitis: POSITIVE — AB
Chlamydia: NEGATIVE
Comment: NEGATIVE
Comment: NEGATIVE
Comment: NEGATIVE
Comment: NEGATIVE
Comment: NEGATIVE
Comment: NORMAL
Neisseria Gonorrhea: NEGATIVE
Trichomonas: NEGATIVE

## 2023-07-27 MED ORDER — CLOTRIMAZOLE 1 % VA CREA: 1.0000 | TOPICAL_CREAM | Freq: Every day | VAGINAL | 0 refills | Status: DC

## 2023-07-27 MED ORDER — METRONIDAZOLE 500 MG PO TABS: 500.0000 mg | ORAL_TABLET | Freq: Two times a day (BID) | ORAL | 0 refills | Status: DC

## 2023-07-27 NOTE — Telephone Encounter (Signed)
Metronidazole and Clotrimazole for positive BV and yeast

## 2023-08-03 DIAGNOSIS — Z3492 Encounter for supervision of normal pregnancy, unspecified, second trimester: Secondary | ICD-10-CM | POA: Diagnosis not present

## 2023-08-03 DIAGNOSIS — Z3A27 27 weeks gestation of pregnancy: Secondary | ICD-10-CM | POA: Diagnosis not present

## 2023-08-05 ENCOUNTER — Encounter (HOSPITAL_COMMUNITY): Payer: Self-pay | Admitting: Obstetrics and Gynecology

## 2023-08-05 ENCOUNTER — Other Ambulatory Visit: Payer: Self-pay

## 2023-08-05 ENCOUNTER — Telehealth: Payer: Self-pay | Admitting: Family Medicine

## 2023-08-05 ENCOUNTER — Inpatient Hospital Stay (HOSPITAL_COMMUNITY)
Admission: AD | Admit: 2023-08-05 | Discharge: 2023-08-05 | Disposition: A | Discharge status: Home / Self Care | Payer: BC Managed Care – PPO | Attending: Obstetrics & Gynecology | Admitting: Obstetrics & Gynecology

## 2023-08-05 DIAGNOSIS — Z3A27 27 weeks gestation of pregnancy: Secondary | ICD-10-CM | POA: Diagnosis not present

## 2023-08-05 DIAGNOSIS — O0942 Supervision of pregnancy with grand multiparity, second trimester: Secondary | ICD-10-CM | POA: Insufficient documentation

## 2023-08-05 DIAGNOSIS — O132 Gestational [pregnancy-induced] hypertension without significant proteinuria, second trimester: Secondary | ICD-10-CM | POA: Insufficient documentation

## 2023-08-05 DIAGNOSIS — O139 Gestational [pregnancy-induced] hypertension without significant proteinuria, unspecified trimester: Secondary | ICD-10-CM

## 2023-08-05 DIAGNOSIS — R350 Frequency of micturition: Secondary | ICD-10-CM | POA: Diagnosis not present

## 2023-08-05 LAB — URINALYSIS, ROUTINE W REFLEX MICROSCOPIC
Bilirubin Urine: NEGATIVE
Glucose, UA: NEGATIVE mg/dL
Hgb urine dipstick: NEGATIVE
Ketones, ur: NEGATIVE mg/dL
Nitrite: NEGATIVE
Protein, ur: NEGATIVE mg/dL
Specific Gravity, Urine: 1.021 (ref 1.005–1.030)
pH: 6 (ref 5.0–8.0)

## 2023-08-05 LAB — COMPREHENSIVE METABOLIC PANEL
ALT: 30 U/L (ref 0–44)
AST: 22 U/L (ref 15–41)
Albumin: 2.9 g/dL — ABNORMAL LOW (ref 3.5–5.0)
Alkaline Phosphatase: 49 U/L (ref 38–126)
Anion gap: 5 (ref 5–15)
BUN: 8 mg/dL (ref 6–20)
CO2: 23 mmol/L (ref 22–32)
Calcium: 8.3 mg/dL — ABNORMAL LOW (ref 8.9–10.3)
Chloride: 104 mmol/L (ref 98–111)
Creatinine, Ser: 0.86 mg/dL (ref 0.44–1.00)
GFR, Estimated: >60 mL/min (ref >60)
Glucose, Bld: 93 mg/dL (ref 70–99)
Potassium: 3.9 mmol/L (ref 3.5–5.1)
Sodium: 132 mmol/L — ABNORMAL LOW (ref 135–145)
Total Bilirubin: 0.2 mg/dL — ABNORMAL LOW (ref 0.3–1.2)
Total Protein: 6.6 g/dL (ref 6.5–8.1)

## 2023-08-05 LAB — CBC
HCT: 29.5 % — ABNORMAL LOW (ref 36.0–46.0)
Hemoglobin: 9.6 g/dL — ABNORMAL LOW (ref 12.0–15.0)
MCH: 28.2 pg (ref 26.0–34.0)
MCHC: 32.5 g/dL (ref 30.0–36.0)
MCV: 86.5 fL (ref 80.0–100.0)
Platelets: 155 10*3/uL (ref 150–400)
RBC: 3.41 MIL/uL — ABNORMAL LOW (ref 3.87–5.11)
RDW: 13.7 % (ref 11.5–15.5)
WBC: 9.2 10*3/uL (ref 4.0–10.5)
nRBC: 0 % (ref 0.0–0.2)

## 2023-08-05 LAB — WET PREP, GENITAL
Clue Cells Wet Prep HPF POC: NONE SEEN
Sperm: NONE SEEN
Trich, Wet Prep: NONE SEEN
WBC, Wet Prep HPF POC: <10 (ref <10)
Yeast Wet Prep HPF POC: NONE SEEN

## 2023-08-05 LAB — PROTEIN / CREATININE RATIO, URINE
Creatinine, Urine: 191 mg/dL
Protein Creatinine Ratio: 0.1 mg/mg{creat} (ref 0.00–0.15)
Total Protein, Urine: 19 mg/dL

## 2023-08-05 MED ORDER — METRONIDAZOLE 0.75 % VA GEL: 1.0000 | Freq: Every day | VAGINAL | 5 refills | Status: DC

## 2023-08-05 NOTE — Telephone Encounter (Signed)
Received call from the patient at 1908 with complaint of pelvic pressure, cramping, and urinary frequency that started at 1200 today. It has been constant. She denies fever and dysuria. Endorses mild back pain. +FM, no LOF or VB. Given her hx of preterm delivery and concern for UTI, I advised her to seek further evaluation in MAU for these symptoms. She voiced understanding.   Theresa Popiel Autry-Lott, DO 08/05/2023, 9:29 PM

## 2023-08-05 NOTE — MAU Provider Note (Signed)
Chief Complaint:  Urinary Frequency and Pelvic Pain   Event Date/Time   First Provider Initiated Contact with Patient 08/05/23 2020     HPI  HPI: TWANNA CROWEL is a 32 y.o. [id number] at 55w5dwho presents to maternity admissions reporting frequent urination today without dysuria, flank pain, fever, chills or change in vaginal discharge. Associated pelvic and suprapubic pressure.  She reports good fetal movement, denies LOF, vaginal bleeding, vaginal itching/burning, h/a, dizziness, n/v, diarrhea, constipation or fever/chills.  She denies headache, visual changes or RUQ abdominal pain.   Past Medical History: Past Medical History:  Diagnosis Date   Hypertension    Preterm labor    STD (sexually transmitted disease)    Chlamydia    Past obstetric history: OB History  Gravida Para Term Preterm AB Living  6 3 2 1 2 3   SAB IAB Ectopic Multiple Live Births  1 1   0 3    # Outcome Date GA Lbr Len/2nd Weight Sex Type Anes PTL Lv  6 Current           5 Term 04/27/19 [redacted]w[redacted]d 14:00 / 00:14 3490 g M Vag-Spont EPI  LIV     Birth Comments: WNL  4 Term 10/03/15 [redacted]w[redacted]d 02:29 / 00:02 2845 g F Vag-Spont None  LIV  3 Preterm 02/19/11 [redacted]w[redacted]d   M Vag-Spont   LIV  2 IAB 2008          1 SAB             Past Surgical History: Past Surgical History:  Procedure Laterality Date   INDUCED ABORTION      Family History: Family History  Problem Relation Age of Onset   Hypertension Mother    Diabetes Father    Hypertension Father    Diabetes Paternal Uncle    Renal Disease Maternal Grandmother    Diabetes Paternal Grandfather     Social History: Social History   Tobacco Use   Smoking status: Former    Current packs/day: 0.00    Types: Cigarettes    Quit date: 01/26/2015    Years since quitting: 8.5   Smokeless tobacco: Never  Vaping Use   Vaping status: Never Used  Substance Use Topics   Alcohol use: Not Currently   Drug use: Not Currently    Types: Marijuana    Allergies: No  Known Allergies  Meds:  Medications Prior to Admission  Medication Sig Dispense Refill Last Dose   metroNIDAZOLE (FLAGYL) 500 MG tablet Take 1 tablet (500 mg total) by mouth 2 (two) times daily. 14 tablet 0 08/05/2023   azelastine (ASTELIN) 0.1 % nasal spray Place 1 spray into both nostrils 2 (two) times daily. Use in each nostril as directed 30 mL 0    calcium carbonate (TUMS EX) 750 MG chewable tablet Chew 1 tablet by mouth daily.      clotrimazole (GYNE-LOTRIMIN) 1 % vaginal cream Place 1 Applicatorful vaginally at bedtime. 45 g 0     I have reviewed patient's Past Medical Hx, Surgical Hx, Family Hx, Social Hx, medications and allergies.   ROS:  Review of Systems Other systems negative  Physical Exam  Patient Vitals for the past 24 hrs:  BP Temp Pulse Resp SpO2 Height Weight  08/05/23 2132 (!) 145/74 -- -- -- -- -- --  08/05/23 2131 -- -- -- -- 98 % -- --  08/05/23 2130 -- -- -- -- 98 % -- --  08/05/23 2126 -- -- -- -- 98 % -- --  08/05/23 2121 -- -- -- -- 98 % -- --  08/05/23 2117 (!) 149/85 -- (!) 49 -- -- -- --  08/05/23 2102 (!) 142/73 -- -- -- -- -- --  08/05/23 2101 -- -- -- -- 99 % -- --  08/05/23 2100 -- -- -- -- 99 % -- --  08/05/23 2056 -- -- -- -- 98 % -- --  08/05/23 2051 -- -- -- -- 99 % -- --  08/05/23 2047 (!) 143/75 -- (!) 46 -- -- -- --  08/05/23 2046 -- -- -- -- 99 % -- --  08/05/23 2031 -- -- -- -- 99 % -- --  08/05/23 2030 -- -- -- -- 99 % -- --  08/05/23 2026 -- -- -- -- 99 % -- --  08/05/23 2018 -- -- -- -- 99 % -- --  08/05/23 2014 (!) 152/68 -- (!) 50 17 -- -- --  08/05/23 1953 135/70 97.9 F (36.6 C) (!) 52 17 100 % 5\' 3"  (1.6 m) 114.8 kg   Constitutional: Well-developed, well-nourished female in no acute distress.  Cardiovascular: normal rate and rhythm Respiratory: normal effort, clear to auscultation bilaterally GI: Abd soft, non-tender, gravid appropriate for gestational age.   No rebound or guarding. MS: Extremities nontender, no edema,  normal ROM Neurologic: Alert and oriented x 4.  GU: Neg CVAT.  PELVIC EXAM: Cervix pink, visually 1 cm, without lesion, moderate white creamy discharge, vaginal walls and external genitalia normal   FHT:  Baseline 165, moderate variability, accelerations absent, no decelerations Contractions: denied   Labs: Results for orders placed or performed during the hospital encounter of 08/05/23 (from the past 24 hour(s))  Urinalysis, Routine w reflex microscopic -Urine, Clean Catch     Status: Abnormal   Collection Time: 08/05/23  8:05 PM  Result Value Ref Range   Color, Urine YELLOW YELLOW   APPearance HAZY (A) CLEAR   Specific Gravity, Urine 1.021 1.005 - 1.030   pH 6.0 5.0 - 8.0   Glucose, UA NEGATIVE NEGATIVE mg/dL   Hgb urine dipstick NEGATIVE NEGATIVE   Bilirubin Urine NEGATIVE NEGATIVE   Ketones, ur NEGATIVE NEGATIVE mg/dL   Protein, ur NEGATIVE NEGATIVE mg/dL   Nitrite NEGATIVE NEGATIVE   Leukocytes,Ua SMALL (A) NEGATIVE   RBC / HPF 0-5 0 - 5 RBC/hpf   WBC, UA 0-5 0 - 5 WBC/hpf   Bacteria, UA RARE (A) NONE SEEN   Squamous Epithelial / HPF 11-20 0 - 5 /HPF   Mucus PRESENT   Wet prep, genital     Status: None   Collection Time: 08/05/23  8:35 PM   Specimen: Cervix  Result Value Ref Range   Yeast Wet Prep HPF POC NONE SEEN NONE SEEN   Trich, Wet Prep NONE SEEN NONE SEEN   Clue Cells Wet Prep HPF POC NONE SEEN NONE SEEN   WBC, Wet Prep HPF POC <10 <10   Sperm NONE SEEN       Imaging:  [name] MFM OB FOLLOW UP  Result Date: 07/21/2023 ----------------------------------------------------------------------  OBSTETRICS REPORT                       (Signed Final 07/21/2023 12:06 pm) ---------------------------------------------------------------------- Patient Info  ID #:       [id number]                          D.O.B.:  1991/02/14 (31 yrs)  Name:  RUFINA HIGNIGHT             Visit Date: 07/21/2023 11:08 am ----------------------------------------------------------------------  Performed By  Attending:        Ma Rings MD         Ref. Address:     [facility]                                                             Gynecology                                                             [address]                                                             [address]                                                             [place], [place]                                                             [address]  Performed By:     Alain Marion     [facility]  Referred By:      Nigel Bridgeman                    CNM ---------------------------------------------------------------------- Orders  #  Description                           Code        Ordered By  1  [name] MFM OB FOLLOW UP                   [phone]    Noralee Space ----------------------------------------------------------------------  #  Order #                     Accession #                Episode #  1  [id number]                   [id number]                 [id number] ---------------------------------------------------------------------- Indications  Obesity complicating pregnancy, second         O99.212  trimester (PG BMI - 41)  Pre-existing essential hypertension            O10.012  complicating pregnancy, second trimester  Poor obstetric history: Previous preterm       O09.219  delivery, antepartum (26 wks)  Poor obstetric history: Previous gestational   O09.299  HTN  LR NIPT, no record of AFP or Horizon  [redacted] weeks gestation of pregnancy                Z3A.25  Antenatal follow-up for nonvisualized fetal    Z36.2  anatomy  ---------------------------------------------------------------------- Fetal Evaluation  Num Of Fetuses:         1  Fetal Heart Rate(bpm):  158  Cardiac Activity:       Observed  Presentation:           Cephalic  Placenta:               Posterior  P. Cord Insertion:      Previously seen  Amniotic Fluid  AFI FV:      Within normal limits  AFI Sum(cm)     %Tile       Largest Pocket(cm)  16.86           63          5.61  RUQ(cm)       RLQ(cm)       LUQ(cm)        LLQ(cm)  4.6           5.61          3.15           3.5 ---------------------------------------------------------------------- Biometry  BPD:      60.4  mm     G. Age:  24w 4d         13  %    CI:        70.31   %  70 - 86                                                          FL/HC:      20.1   %    18.6 - 20.4  HC:      229.7  mm     G. Age:  25w 0d         12  %    HC/AC:      1.15        1.04 - 1.22  AC:      199.7  mm     G. Age:  24w 4d         15  %    FL/BPD:     76.5   %    71 - 87  FL:       46.2  mm     G. Age:  25w 2d         29  %    FL/AC:      23.1   %    20 - 24  LV:        6.2  mm  Est. FW:     749  gm    1 lb 10 oz      16  % ---------------------------------------------------------------------- OB History  Gravidity:    6         Term:   3        Prem:   1        SAB:   0  TOP:          1       Ectopic:  0        Living: 3 ---------------------------------------------------------------------- Gestational Age  LMP:           25w 4d        Date:  01/23/23                  EDD:   10/30/23  U/S Today:     24w 6d                                        EDD:   11/04/23  Best:          25w 4d     Det. By:  LMP  (01/23/23)          EDD:   10/30/23 ---------------------------------------------------------------------- Anatomy  Cranium:               Appears normal         LVOT:                   Appears normal  Cavum:                 Previously seen        Aortic Arch:            Previously seen  Ventricles:            Appears normal         Ductal  Arch:            Previously seen  Choroid Plexus:        Appears normal         Diaphragm:              Appears normal  Cerebellum:            Previously seen        Stomach:                Appears normal, left                                                                        sided  Posterior Fossa:       Previously seen        Abdomen:                Appears normal  Nuchal Fold:           Previously seen        Abdominal Wall:         Previously seen  Face:                  Orbits and profile     Cord Vessels:           Previously seen                         previously seen  Lips:                  Previously seen        Kidneys:                Appear normal  Palate:                Previously seen        Bladder:                Appears normal  Thoracic:              Previously seen        Spine:                  Previously seen  Heart:                 Previously seen        Upper Extremities:      Previously seen  RVOT:                  Appears normal         Lower Extremities:      Previously seen  Other:  Right hand appears normal. Previously visualized heels/feet, open          hands/5th digits, nasal bone, lenses, maxilla, mandible, falx, VC, 3VV          and 3VTV. ---------------------------------------------------------------------- Cervix Uterus Adnexa  Cervix  Length:            3.9  cm.  Normal appearance by transabdominal scan  Uterus  No abnormality visualized.  Right Ovary  Not visualized.  Left Ovary  Not visualized.  Cul De Sac  No free fluid seen.  Adnexa  No abnormality visualized ---------------------------------------------------------------------- Comments  This patient was  seen for a follow up exam as the views of  the fetal anatomy were unable to be fully visualized during  her last exam due to maternal obesity with a BMI of 41.  She  denies any problems since her last exam.  She was informed that the fetal growth and amniotic fluid  level appears appropriate for her gestational age.  The  views of the fetal anatomy were visualized today.  There  were no obvious anomalies noted.  The limitations of ultrasound in the detection of all anomalies  was discussed.  As her BMI is greater than 40, weekly fetal testing should be  started at around 34 weeks.  She will return in 5 weeks for another growth scan. ----------------------------------------------------------------------                   Ma Rings, MD Electronically Signed Final Report   07/21/2023 12:06 pm ----------------------------------------------------------------------    MAU Course/MDM: I have reviewed the triage vital signs and the nursing notes.   Pertinent labs & imaging results that were available during my care of the patient were reviewed by me and considered in my medical decision making (see chart for details).      I have reviewed her medical records including past results, notes and treatments.   I have ordered labs and reviewed results.  NST reviewed Treatments in MAU included sterile speculum, vaginal swabs, UA, and tocometry.    Assessment: 1. [redacted] weeks gestation of pregnancy   2. Gestational hypertension affecting sixth pregnancy   Negative UA and vaginal swabs, possible gonorrhea or chlamydia though low suspicion based on exam.   Cervix visually 1cm in [id number], unlikely preterm labor. No contractions on tocometry.   Mild range blood pressures while in MAU, no diagnosis of HTN in chart though previous visits to MAU with SBP 130, meeting dx criteria for gestational HTN. Baseline preeclampsia labs collected.   Plan: Discharge home Follow up on preeclampsia labs Labor precautions and fetal kick counts Follow up in Office for prenatal visits and recheck   Follow-up Information     [facility] Follow up.   Specialty: Obstetrics and Gynecology Why: Continue routine prenatal care as scheduled  Sooner as needed for worsening symptoms or labor conerns Contact information: [address] [place] [address] [phone]                Pt stable at time of discharge.  Wyn Forster, MD FMOB Fellow, Faculty practice [facility]  08/05/2023 10:05 PM

## 2023-08-05 NOTE — MAU Note (Signed)
.  Theresa Horne is a 32 y.o. at [redacted]w[redacted]d here in MAU reporting: around 1200 started having frequent urination - every 10-15 minutes. Denies pain with urination. Light cramping and pressure in pelvis. Called on call OB and was told to come here for evaluation. Denies VB or LOF. +FM. Reports yeast infection and BV - currently taking antibiotics.   Onset of complaint: 1200 Pain score: 5 Vitals:   08/05/23 1953  BP: 135/70  Pulse: (!) 52  Resp: 17  Temp: 97.9 F (36.6 C)  SpO2: 100%     FHT:165 Lab orders placed from triage:  UA

## 2023-08-07 LAB — GC/CHLAMYDIA PROBE AMP (~~LOC~~) NOT AT ARMC
Chlamydia: NEGATIVE
Comment: NEGATIVE
Comment: NORMAL
Neisseria Gonorrhea: NEGATIVE

## 2023-08-16 DIAGNOSIS — Z23 Encounter for immunization: Secondary | ICD-10-CM | POA: Diagnosis not present

## 2023-08-25 ENCOUNTER — Ambulatory Visit: Payer: BC Managed Care – PPO

## 2023-08-29 ENCOUNTER — Ambulatory Visit: Payer: BC Managed Care – PPO

## 2023-08-31 ENCOUNTER — Other Ambulatory Visit: Payer: Self-pay | Admitting: *Deleted

## 2023-08-31 ENCOUNTER — Telehealth: Payer: BC Managed Care – PPO | Admitting: Physician Assistant

## 2023-08-31 ENCOUNTER — Ambulatory Visit: Payer: BC Managed Care – PPO | Attending: Obstetrics

## 2023-08-31 DIAGNOSIS — O99212 Obesity complicating pregnancy, second trimester: Secondary | ICD-10-CM | POA: Insufficient documentation

## 2023-08-31 DIAGNOSIS — E669 Obesity, unspecified: Secondary | ICD-10-CM | POA: Diagnosis not present

## 2023-08-31 DIAGNOSIS — O09293 Supervision of pregnancy with other poor reproductive or obstetric history, third trimester: Secondary | ICD-10-CM | POA: Diagnosis not present

## 2023-08-31 DIAGNOSIS — O09213 Supervision of pregnancy with history of pre-term labor, third trimester: Secondary | ICD-10-CM

## 2023-08-31 DIAGNOSIS — O99213 Obesity complicating pregnancy, third trimester: Secondary | ICD-10-CM | POA: Diagnosis not present

## 2023-08-31 DIAGNOSIS — Z3A31 31 weeks gestation of pregnancy: Secondary | ICD-10-CM

## 2023-08-31 DIAGNOSIS — N760 Acute vaginitis: Secondary | ICD-10-CM

## 2023-08-31 DIAGNOSIS — Z3493 Encounter for supervision of normal pregnancy, unspecified, third trimester: Secondary | ICD-10-CM

## 2023-08-31 NOTE — Progress Notes (Signed)
For the safety of you and your child, I recommend a face to face office visit with a health care provider.  Many mothers need to take medicines during their pregnancy and while nursing.  Almost all medicines pass into the breast milk in small quantities.  Most are generally considered safe for a mother to take but some medicines must be avoided.  After reviewing your E-Visit request, I recommend that you consult your OB/GYN for medical advice in relation to your condition and prescription medications while pregnant or breastfeeding.  NOTE:  There will be NO CHARGE for this eVisit  If you are having a true medical emergency please call 911.    For an urgent face to face visit, [place] has six urgent care centers for your convenience:     [facility] at [facility] Directions [phone] [address] [place], [place] [address]    [facility]gent [facility] Center [facility]) Get Driving Directions [phone] [address] [place], [place] [address]  [facility] Health Urgent Care Center [facility]) Get Driving Directions [phone] [address] [place],  [place]  [address]  [facility] at [facility] Get Driving Directions [phone] [address], [address] [place], [place] [address]   [facility] Health Urgent Care at [facility] Get Driving Directions  [phone] [address] [place], [place] [address]   [facility] Health Urgent Care at [facility] Directions [phone] [address], [address] [place], [place] [address]  Your MyChart E-visit questionnaire answers were reviewed by a board certified advanced clinical practitioner to complete your personal care plan based on your specific symptoms.  Thank you for using e-Visits.

## 2023-09-13 DIAGNOSIS — Z23 Encounter for immunization: Secondary | ICD-10-CM | POA: Diagnosis not present

## 2023-09-22 ENCOUNTER — Ambulatory Visit: Payer: BC Managed Care – PPO | Attending: Obstetrics

## 2023-09-22 DIAGNOSIS — O99212 Obesity complicating pregnancy, second trimester: Secondary | ICD-10-CM | POA: Insufficient documentation

## 2023-09-22 DIAGNOSIS — O09213 Supervision of pregnancy with history of pre-term labor, third trimester: Secondary | ICD-10-CM | POA: Diagnosis not present

## 2023-09-22 DIAGNOSIS — E669 Obesity, unspecified: Secondary | ICD-10-CM

## 2023-09-22 DIAGNOSIS — O09293 Supervision of pregnancy with other poor reproductive or obstetric history, third trimester: Secondary | ICD-10-CM | POA: Diagnosis not present

## 2023-09-22 DIAGNOSIS — O99213 Obesity complicating pregnancy, third trimester: Secondary | ICD-10-CM

## 2023-09-22 DIAGNOSIS — Z3A34 34 weeks gestation of pregnancy: Secondary | ICD-10-CM

## 2023-09-25 ENCOUNTER — Other Ambulatory Visit: Payer: Self-pay

## 2023-09-25 ENCOUNTER — Inpatient Hospital Stay (HOSPITAL_COMMUNITY)
Admission: AD | Admit: 2023-09-25 | Discharge: 2023-09-25 | Disposition: A | Discharge status: Home / Self Care | Payer: BC Managed Care – PPO | Attending: Obstetrics and Gynecology | Admitting: Obstetrics and Gynecology

## 2023-09-25 ENCOUNTER — Encounter (HOSPITAL_COMMUNITY): Payer: Self-pay | Admitting: Obstetrics and Gynecology

## 2023-09-25 DIAGNOSIS — S93409A Sprain of unspecified ligament of unspecified ankle, initial encounter: Secondary | ICD-10-CM | POA: Diagnosis not present

## 2023-09-25 DIAGNOSIS — Z3A35 35 weeks gestation of pregnancy: Secondary | ICD-10-CM | POA: Insufficient documentation

## 2023-09-25 DIAGNOSIS — X58XXXA Exposure to other specified factors, initial encounter: Secondary | ICD-10-CM | POA: Insufficient documentation

## 2023-09-25 DIAGNOSIS — O10913 Unspecified pre-existing hypertension complicating pregnancy, third trimester: Secondary | ICD-10-CM | POA: Diagnosis not present

## 2023-09-25 DIAGNOSIS — O9A213 Injury, poisoning and certain other consequences of external causes complicating pregnancy, third trimester: Secondary | ICD-10-CM | POA: Diagnosis not present

## 2023-09-25 DIAGNOSIS — M79672 Pain in left foot: Secondary | ICD-10-CM | POA: Insufficient documentation

## 2023-09-25 DIAGNOSIS — O10919 Unspecified pre-existing hypertension complicating pregnancy, unspecified trimester: Secondary | ICD-10-CM

## 2023-09-25 LAB — COMPREHENSIVE METABOLIC PANEL
ALT: 19 U/L (ref 0–44)
AST: 20 U/L (ref 15–41)
Albumin: 2.8 g/dL — ABNORMAL LOW (ref 3.5–5.0)
Alkaline Phosphatase: 77 U/L (ref 38–126)
Anion gap: 6 (ref 5–15)
BUN: 5 mg/dL — ABNORMAL LOW (ref 6–20)
CO2: 21 mmol/L — ABNORMAL LOW (ref 22–32)
Calcium: 8.8 mg/dL — ABNORMAL LOW (ref 8.9–10.3)
Chloride: 106 mmol/L (ref 98–111)
Creatinine, Ser: 0.96 mg/dL (ref 0.44–1.00)
GFR, Estimated: >60 mL/min (ref >60)
Glucose, Bld: 89 mg/dL (ref 70–99)
Potassium: 3.8 mmol/L (ref 3.5–5.1)
Sodium: 133 mmol/L — ABNORMAL LOW (ref 135–145)
Total Bilirubin: 0.3 mg/dL (ref 0.3–1.2)
Total Protein: 6.8 g/dL (ref 6.5–8.1)

## 2023-09-25 LAB — CBC
HCT: 30.5 % — ABNORMAL LOW (ref 36.0–46.0)
Hemoglobin: 9.5 g/dL — ABNORMAL LOW (ref 12.0–15.0)
MCH: 26 pg (ref 26.0–34.0)
MCHC: 31.1 g/dL (ref 30.0–36.0)
MCV: 83.3 fL (ref 80.0–100.0)
Platelets: 148 10*3/uL — ABNORMAL LOW (ref 150–400)
RBC: 3.66 MIL/uL — ABNORMAL LOW (ref 3.87–5.11)
RDW: 14 % (ref 11.5–15.5)
WBC: 7.1 10*3/uL (ref 4.0–10.5)
nRBC: 0 % (ref 0.0–0.2)

## 2023-09-25 LAB — PROTEIN / CREATININE RATIO, URINE
Creatinine, Urine: 198 mg/dL
Protein Creatinine Ratio: 0.11 mg/mg{creat} (ref 0.00–0.15)
Total Protein, Urine: 21 mg/dL

## 2023-09-25 MED ORDER — ACETAMINOPHEN-CAFFEINE 500-65 MG PO TABS
2.0000 | ORAL_TABLET | Freq: Once | ORAL | Status: AC
  Administered 2023-09-25: 2 via ORAL
  Filled 2023-09-25: qty 2

## 2023-09-25 MED ORDER — NIFEDIPINE ER OSMOTIC RELEASE 30 MG PO TB24
30.0000 mg | ORAL_TABLET | Freq: Every day | ORAL | Status: DC
  Administered 2023-09-25: 30 mg via ORAL
  Filled 2023-09-25: qty 1

## 2023-09-25 MED ORDER — NIFEDIPINE ER 30 MG PO TB24: 30.0000 mg | ORAL_TABLET | Freq: Every day | ORAL | 0 refills | Status: DC

## 2023-09-25 NOTE — MAU Note (Signed)
Theresa Horne is a 32 y.o. at [redacted]w[redacted]d here in MAU reporting: she's having shooting pain up left leg that began yesterday.  Reports pain starts @ ankle then shoots up leg.  States took Tylenol yesterday, no relief noted. Denies VB or LOF.  Endorses +FM. LMP: NA Onset of complaint: today Pain score: 7 Vitals:   09/25/23 1457  BP: 130/82  Pulse: 95  Resp: 18  Temp: 98.2 F (36.8 C)  SpO2: 99%     FHT:154 bpm Lab orders placed from triage:   None

## 2023-09-25 NOTE — Discharge Instructions (Signed)

## 2023-09-25 NOTE — MAU Provider Note (Signed)
Lower extremity swelling, Sharp pain in LLE     S [name] is a 32 y.o. [phone] pregnant female at [redacted]w[redacted]d who presents to MAU today with complaint of swelling and pain in lower extremity. Reports shooting sharp pain up her LLE that began 10/27.  Pain starts at the heel then shoots up her leg in the achilles tendon distrubution. She took tylenol but no relief.  She denies trauma and states was sitting on couch when pain started.  Can bear weight and normal ambulation, not worsened by ambulation, but states it's a constant "soreness". Denies paraesthesias or weakness. Has chronic swelling in both lower extremities and subjectively believes the LLE at times may be more "puffy" than the RLE.  Denies CP, SOB, palpitations. Denies VB, LOF.  Endorses +FM. Pt was noted to have moderate range BP, has hx of CHTN, no on antihypertensives at this time.  Denies RUQ pain.  Endorses slight HA.  Has not taken any tylenol today.    Receives care at [facility]. Prenatal records reviewed.  Pertinent items noted in HPI and remainder of comprehensive ROS otherwise negative.   O BP 136/78   Pulse 91   Temp 98.2 F (36.8 C) (Oral)   Resp 18   Ht 5\' 3"  (1.6 m)   Wt 115.7 kg   LMP 01/23/2023   SpO2 99%   BMI 45.17 kg/m  Physical Exam Vitals and nursing note reviewed.  Constitutional:      General: She is not in acute distress.    Appearance: Normal appearance. She is obese. She is not ill-appearing.  HENT:     Head: Normocephalic and atraumatic.     Right Ear: External ear normal.     Left Ear: External ear normal.     Nose: Nose normal.     Mouth/Throat:     Mouth: Mucous membranes are moist.     Pharynx: Oropharynx is clear.  Eyes:     Extraocular Movements: Extraocular movements intact.     Conjunctiva/sclera: Conjunctivae normal.  Cardiovascular:     Rate and Rhythm: Normal rate.     Comments: Mild tachycardia noted Pulmonary:     Effort: Pulmonary effort is normal. No respiratory  distress.  Abdominal:     General: Abdomen is flat. There is no distension.     Palpations: Abdomen is soft.  Musculoskeletal:        General: No swelling or tenderness. Normal range of motion.     Cervical back: Normal range of motion.     Comments: Pt states planter flexion makes pain worse but no point tenderness  Skin:    General: Skin is warm and dry.  Neurological:     Mental Status: She is alert and oriented to person, place, and time. Mental status is at baseline.     Motor: No weakness.     Gait: Gait normal.  Psychiatric:        Mood and Affect: Mood normal.        Behavior: Behavior normal.        Thought Content: Thought content normal.        Judgment: Judgment normal.      MDM: MAU Course:  CBC no leukocytosis, Hgb 9.5*, Plts 148* (155 1 mo prior) CMP Cr 0.96 (baseline 0.86), LFTs 20/19 UPC 0.11  NST 150bpm, moderate variability, +accels, no decels, no ctx   Excedrin Tension HA (1g Tylenol in total) x 1 with relief of HA and L heel pain.  Labs showing slightly elevated Cr but otherwise reassuring.  Bps mild to moderate ranges throughout visit.  Spoke with on call provider for [facility] who agrees with plan of office visit before end of the week and repeat BMP and BP check at that visit.  Okay with starting procardia 30mg  every day first dose here. Stable at this time for d/c.    A&P: #[redacted] weeks gestation #Chronic HTN - would recommend growth [name] 36 weeks, weekly BPPs (reactive NST today), and consideration of IOL between 37-[redacted]weeks gestation depending on control, decisions to be made by [facility] provider  - Procardia 30mg  every day   #L heel pain no ipsilateral swelling to suggest DVT, no [place] ankle points to require imaging  - given hand outs for daily exercises to help with achilles tendon injury and sprain ankle     Discharge from MAU in stable condition with strict/usual precautions Follow up at [facility] as scheduled for ongoing prenatal care  Allergies as of  09/25/2023   No Known Allergies      Medication List     STOP taking these medications    metroNIDAZOLE 0.75 % vaginal gel Commonly known as: METROGEL       TAKE these medications    calcium carbonate 750 MG chewable tablet Commonly known as: TUMS EX Chew 1 tablet by mouth daily.   NIFEdipine 30 MG 24 hr tablet Commonly known as: ADALAT CC Take 1 tablet (30 mg total) by mouth daily. Start taking on: September 26, 2023        Hessie Dibble, MD 09/25/2023 5:49 PM

## 2023-09-27 DIAGNOSIS — O169 Unspecified maternal hypertension, unspecified trimester: Secondary | ICD-10-CM | POA: Diagnosis present

## 2023-09-27 DIAGNOSIS — O139 Gestational [pregnancy-induced] hypertension without significant proteinuria, unspecified trimester: Secondary | ICD-10-CM | POA: Diagnosis not present

## 2023-09-27 DIAGNOSIS — R350 Frequency of micturition: Secondary | ICD-10-CM | POA: Diagnosis not present

## 2023-09-29 ENCOUNTER — Ambulatory Visit: Payer: BC Managed Care – PPO | Attending: Maternal & Fetal Medicine

## 2023-09-29 DIAGNOSIS — Z3A35 35 weeks gestation of pregnancy: Secondary | ICD-10-CM | POA: Insufficient documentation

## 2023-09-29 DIAGNOSIS — Z3493 Encounter for supervision of normal pregnancy, unspecified, third trimester: Secondary | ICD-10-CM | POA: Insufficient documentation

## 2023-09-29 DIAGNOSIS — O09213 Supervision of pregnancy with history of pre-term labor, third trimester: Secondary | ICD-10-CM

## 2023-09-29 DIAGNOSIS — E669 Obesity, unspecified: Secondary | ICD-10-CM | POA: Diagnosis not present

## 2023-09-29 DIAGNOSIS — O10913 Unspecified pre-existing hypertension complicating pregnancy, third trimester: Secondary | ICD-10-CM | POA: Diagnosis not present

## 2023-09-29 DIAGNOSIS — O09293 Supervision of pregnancy with other poor reproductive or obstetric history, third trimester: Secondary | ICD-10-CM | POA: Diagnosis not present

## 2023-09-29 DIAGNOSIS — O99213 Obesity complicating pregnancy, third trimester: Secondary | ICD-10-CM | POA: Diagnosis not present

## 2023-10-03 DIAGNOSIS — Z3686 Encounter for antenatal screening for cervical length: Secondary | ICD-10-CM | POA: Diagnosis not present

## 2023-10-03 LAB — OB RESULTS CONSOLE GBS: GBS: POSITIVE

## 2023-10-04 ENCOUNTER — Encounter (HOSPITAL_COMMUNITY): Payer: Self-pay | Admitting: Obstetrics and Gynecology

## 2023-10-04 ENCOUNTER — Inpatient Hospital Stay (HOSPITAL_COMMUNITY)
Admission: AD | Admit: 2023-10-04 | Discharge: 2023-10-04 | Disposition: A | Discharge status: Home / Self Care | Payer: BC Managed Care – PPO | Attending: Obstetrics and Gynecology | Admitting: Obstetrics and Gynecology

## 2023-10-04 DIAGNOSIS — I491 Atrial premature depolarization: Secondary | ICD-10-CM | POA: Diagnosis not present

## 2023-10-04 DIAGNOSIS — O2243 Hemorrhoids in pregnancy, third trimester: Secondary | ICD-10-CM | POA: Insufficient documentation

## 2023-10-04 DIAGNOSIS — Z3A36 36 weeks gestation of pregnancy: Secondary | ICD-10-CM | POA: Insufficient documentation

## 2023-10-04 DIAGNOSIS — K649 Unspecified hemorrhoids: Secondary | ICD-10-CM | POA: Diagnosis not present

## 2023-10-04 DIAGNOSIS — O26893 Other specified pregnancy related conditions, third trimester: Secondary | ICD-10-CM | POA: Insufficient documentation

## 2023-10-04 DIAGNOSIS — O99413 Diseases of the circulatory system complicating pregnancy, third trimester: Secondary | ICD-10-CM | POA: Diagnosis not present

## 2023-10-04 DIAGNOSIS — O163 Unspecified maternal hypertension, third trimester: Secondary | ICD-10-CM | POA: Diagnosis not present

## 2023-10-04 LAB — URINALYSIS, ROUTINE W REFLEX MICROSCOPIC
Bilirubin Urine: NEGATIVE
Glucose, UA: NEGATIVE mg/dL
Hgb urine dipstick: NEGATIVE
Ketones, ur: NEGATIVE mg/dL
Nitrite: NEGATIVE
Protein, ur: NEGATIVE mg/dL
Specific Gravity, Urine: 1.021 (ref 1.005–1.030)
pH: 6 (ref 5.0–8.0)

## 2023-10-04 MED ORDER — HYDROCORTISONE (PERIANAL) 1 % EX CREA: TOPICAL_CREAM | CUTANEOUS | 0 refills | Status: DC

## 2023-10-04 MED ORDER — WITCH HAZEL-GLYCERIN EX PADS
MEDICATED_PAD | CUTANEOUS | Status: DC | PRN
  Administered 2023-10-04: 1 via TOPICAL

## 2023-10-04 MED ORDER — LIDOCAINE HCL URETHRAL/MUCOSAL 2 % EX GEL
1.0000 | Freq: Once | CUTANEOUS | Status: AC
  Administered 2023-10-04: 1 via TOPICAL
  Filled 2023-10-04: qty 12

## 2023-10-04 MED ORDER — WITCH HAZEL-GLYCERIN EX PADS: MEDICATED_PAD | CUTANEOUS | 12 refills | Status: DC | PRN

## 2023-10-04 NOTE — MAU Provider Note (Signed)
History     CSN: [id number]  Arrival date and time: 10/04/23 2130   Event Date/Time   First Provider Initiated Contact with Patient 10/04/2023  7:18 AM   No chief complaint on file.   HPI  Theresa Horne is a 32 y.o. [phone] at [redacted]w[redacted]d who presents to the MAU for hemorrhoids.Pt reports history of hemorrhoids and constipation throughout this pregnancy. She had started taking stool softeners and states her stools have been more regular and softer. No fevers, chills, n/v, abd pain. Was Rx'ed Anusol, used it once, didn't help symptoms. Had small amount of bleeding with wiping, otherwise, no overt signs of bleeding. Denies ctx, LOF, VB. Has good FM.  Incidentally, pt noted to have aberrant HR once placed on monitors. Pt has had palpitations in past and is asymptomatic today. Was seen by cardiology approx 3 years ago and evaluated for bradycardia. Pt with known hx HTN,   Past Medical History:  Diagnosis Date   Hypertension    Preterm labor    STD (sexually transmitted disease)    Chlamydia    Past Surgical History:  Procedure Laterality Date   INDUCED ABORTION      Family History  Problem Relation Age of Onset   Hypertension Mother    Diabetes Father    Hypertension Father    Diabetes Paternal Uncle    Renal Disease Maternal Grandmother    Diabetes Paternal Grandfather     Social History   Tobacco Use   Smoking status: Former    Current packs/day: 0.00    Types: Cigarettes    Quit date: 01/26/2015    Years since quitting: 8.6   Smokeless tobacco: Never  Vaping Use   Vaping status: Never Used  Substance Use Topics   Alcohol use: Not Currently   Drug use: Not Currently    Types: Marijuana    Allergies: No Known Allergies  Medications Prior to Admission  Medication Sig Dispense Refill Last Dose   calcium carbonate (TUMS EX) 750 MG chewable tablet Chew 1 tablet by mouth daily.      NIFEdipine (ADALAT CC) 30 MG 24 hr tablet Take 1 tablet (30 mg total) by mouth  daily. 30 tablet 0     ROS reviewed and pertinent positives and negatives as documented in HPI.  Physical Exam   Blood pressure 124/63, pulse 93, temperature 98.4 F (36.9 C), temperature source Oral, resp. rate 14, last menstrual period 01/23/2023, SpO2 99%.  Physical Exam Constitutional:      General: She is not in acute distress.    Appearance: Normal appearance. She is not ill-appearing.  HENT:     Head: Normocephalic and atraumatic.  Cardiovascular:     Rate and Rhythm: Normal rate.  Pulmonary:     Effort: Pulmonary effort is normal.     Breath sounds: Normal breath sounds.  Abdominal:     Palpations: Abdomen is soft.  Genitourinary:    Comments: +1cm hemorrhoid noted, not thrombosed, small area of scabbing likely area that was previously bleeding, no current e/o bleeding Musculoskeletal:        General: Normal range of motion.  Skin:    General: Skin is warm and dry.     Findings: No rash.  Neurological:     General: No focal deficit present.     Mental Status: She is alert and oriented to person, place, and time.   EKG: Sinus rhythm with frequent PACs, nomral intervals, no ST or T wave changes.  EFM: 145/mod/+15x15  accels/no decels  MAU Course  Procedures  MDM 32 y.o. [id number] at [redacted]w[redacted]d presenting for pain associated with hemorrhoids. Tx with Anusol, Lidocaine -- Lidocaine w minimal relief. Discussed importance of bowel regimen. Discussed possibility of surgery consult postpartum vs if symptoms worsen.   EKG as above, no symptoms now. Referral to cardio-obstetrics outpatient.  Assessment and Plan  Hemorrhoids, unspecified hemorrhoid type - Plan: Discharge patient Rx for Preparation H sent Rec Tucks pads, sitz baths, Anusol, bowel regimen  PAC (premature atrial contraction) - Plan: AMB Referral to Cardio Obstetrics Referral to cardio-obstetrics  Patient stable for discharge.  Sundra Aland, MD OB Fellow, Faculty Practice [facility], Center for [facility]  [facility]  10/04/2023, 8:28 AM

## 2023-10-04 NOTE — MAU Note (Signed)
..  Theresa Horne is a 32 y.o. at [redacted]w[redacted]d here in MAU reporting: hemorrhoids that started about x1 week ago. She states that she has tried sitz baths and stool softeners and nothing is helping. She also went to her OB yesterday and was prescribed anusol and has been using it. Yesterday before her appointment she felt a "pop" and then it started to bleed a small amount.  Pain score: 10 Vitals:   10/04/23 0729  BP: 120/80  Pulse: (!) 104  Resp: 14  Temp: 98.4 F (36.9 C)  SpO2: 99%     FHT:145 Lab orders placed from triage:   UA

## 2023-10-05 ENCOUNTER — Inpatient Hospital Stay (HOSPITAL_COMMUNITY)
Admission: AD | Admit: 2023-10-05 | Discharge: 2023-10-05 | Disposition: A | Discharge status: Home / Self Care | Payer: BC Managed Care – PPO | Attending: Obstetrics & Gynecology | Admitting: Obstetrics & Gynecology

## 2023-10-05 ENCOUNTER — Other Ambulatory Visit: Payer: Self-pay | Admitting: *Deleted

## 2023-10-05 ENCOUNTER — Ambulatory Visit: Payer: BC Managed Care – PPO | Admitting: *Deleted

## 2023-10-05 ENCOUNTER — Encounter (HOSPITAL_COMMUNITY): Payer: Self-pay | Admitting: Obstetrics & Gynecology

## 2023-10-05 ENCOUNTER — Other Ambulatory Visit: Payer: Self-pay | Admitting: Maternal & Fetal Medicine

## 2023-10-05 ENCOUNTER — Inpatient Hospital Stay (HOSPITAL_COMMUNITY): Payer: BC Managed Care – PPO

## 2023-10-05 ENCOUNTER — Other Ambulatory Visit: Payer: Self-pay

## 2023-10-05 ENCOUNTER — Ambulatory Visit: Payer: BC Managed Care – PPO

## 2023-10-05 VITALS — BP 120/63 | HR 61

## 2023-10-05 DIAGNOSIS — Z3A36 36 weeks gestation of pregnancy: Secondary | ICD-10-CM

## 2023-10-05 DIAGNOSIS — O99213 Obesity complicating pregnancy, third trimester: Secondary | ICD-10-CM | POA: Insufficient documentation

## 2023-10-05 DIAGNOSIS — R030 Elevated blood-pressure reading, without diagnosis of hypertension: Secondary | ICD-10-CM | POA: Diagnosis not present

## 2023-10-05 DIAGNOSIS — Z3493 Encounter for supervision of normal pregnancy, unspecified, third trimester: Secondary | ICD-10-CM

## 2023-10-05 DIAGNOSIS — O283 Abnormal ultrasonic finding on antenatal screening of mother: Secondary | ICD-10-CM

## 2023-10-05 DIAGNOSIS — Z8679 Personal history of other diseases of the circulatory system: Secondary | ICD-10-CM | POA: Insufficient documentation

## 2023-10-05 DIAGNOSIS — O163 Unspecified maternal hypertension, third trimester: Secondary | ICD-10-CM | POA: Diagnosis not present

## 2023-10-05 DIAGNOSIS — O09293 Supervision of pregnancy with other poor reproductive or obstetric history, third trimester: Secondary | ICD-10-CM | POA: Diagnosis not present

## 2023-10-05 DIAGNOSIS — O10913 Unspecified pre-existing hypertension complicating pregnancy, third trimester: Secondary | ICD-10-CM | POA: Diagnosis not present

## 2023-10-05 DIAGNOSIS — O368130 Decreased fetal movements, third trimester, not applicable or unspecified: Secondary | ICD-10-CM | POA: Diagnosis not present

## 2023-10-05 DIAGNOSIS — K649 Unspecified hemorrhoids: Secondary | ICD-10-CM | POA: Diagnosis not present

## 2023-10-05 DIAGNOSIS — O289 Unspecified abnormal findings on antenatal screening of mother: Secondary | ICD-10-CM

## 2023-10-05 DIAGNOSIS — E669 Obesity, unspecified: Secondary | ICD-10-CM

## 2023-10-05 DIAGNOSIS — O26893 Other specified pregnancy related conditions, third trimester: Secondary | ICD-10-CM | POA: Diagnosis not present

## 2023-10-05 DIAGNOSIS — O09213 Supervision of pregnancy with history of pre-term labor, third trimester: Secondary | ICD-10-CM

## 2023-10-05 DIAGNOSIS — R0789 Other chest pain: Secondary | ICD-10-CM | POA: Insufficient documentation

## 2023-10-05 LAB — COMPREHENSIVE METABOLIC PANEL
ALT: 25 U/L (ref 0–44)
AST: 30 U/L (ref 15–41)
Albumin: 2.9 g/dL — ABNORMAL LOW (ref 3.5–5.0)
Alkaline Phosphatase: 86 U/L (ref 38–126)
Anion gap: 9 (ref 5–15)
BUN: 6 mg/dL (ref 6–20)
CO2: 21 mmol/L — ABNORMAL LOW (ref 22–32)
Calcium: 9.6 mg/dL (ref 8.9–10.3)
Chloride: 104 mmol/L (ref 98–111)
Creatinine, Ser: 0.91 mg/dL (ref 0.44–1.00)
GFR, Estimated: >60 mL/min (ref >60)
Glucose, Bld: 86 mg/dL (ref 70–99)
Potassium: 3.7 mmol/L (ref 3.5–5.1)
Sodium: 134 mmol/L — ABNORMAL LOW (ref 135–145)
Total Bilirubin: 0.5 mg/dL (ref <1.2)
Total Protein: 6.8 g/dL (ref 6.5–8.1)

## 2023-10-05 LAB — PROTEIN / CREATININE RATIO, URINE
Creatinine, Urine: 214 mg/dL
Protein Creatinine Ratio: 0.09 mg/mg{creat} (ref 0.00–0.15)
Total Protein, Urine: 19 mg/dL

## 2023-10-05 LAB — CBC
HCT: 28.8 % — ABNORMAL LOW (ref 36.0–46.0)
Hemoglobin: 9.1 g/dL — ABNORMAL LOW (ref 12.0–15.0)
MCH: 25.6 pg — ABNORMAL LOW (ref 26.0–34.0)
MCHC: 31.6 g/dL (ref 30.0–36.0)
MCV: 81.1 fL (ref 80.0–100.0)
Platelets: 140 10*3/uL — ABNORMAL LOW (ref 150–400)
RBC: 3.55 MIL/uL — ABNORMAL LOW (ref 3.87–5.11)
RDW: 14.3 % (ref 11.5–15.5)
WBC: 6.5 10*3/uL (ref 4.0–10.5)
nRBC: 0 % (ref 0.0–0.2)

## 2023-10-05 MED ORDER — ACETAMINOPHEN 500 MG PO TABS
1000.0000 mg | ORAL_TABLET | Freq: Once | ORAL | Status: AC
  Administered 2023-10-05: 1000 mg via ORAL
  Filled 2023-10-05: qty 2

## 2023-10-05 NOTE — Procedures (Signed)
ARLET MARTER May 10, 1991 [redacted]w[redacted]d  Fetus A Non-Stress Test Interpretation for 10/05/23  Indication: Unsatisfactory BPP  Fetal Heart Rate A Mode: External Baseline Rate (A): 150 bpm Variability: Moderate Accelerations: 15 x 15 Decelerations: None Multiple birth?: No  Uterine Activity Mode: Toco Contraction Frequency (min): UI Resting Tone Palpated: Relaxed  Interpretation (Fetal Testing) Nonstress Test Interpretation: Reactive Comments: Tracing reviewed by  Dr. Parke Poisson

## 2023-10-05 NOTE — MAU Provider Note (Signed)
History     CSN: [id number]  Arrival date and time: 10/05/23 1611   Event Date/Time   First Provider Initiated Contact with Patient 10/05/23 1723      Chief Complaint  Patient presents with   Fetal Monitoring   HPI Patient presenting for evaluation for strange feeling in her chest.  Was seen at MFM today and had BPP of 6 out of 10.  When I called to have her scheduled for repeat BPP tomorrow she discussed the chest discomfort and was told to come to the MAU for further evaluation.  On arrival patient had elevated blood pressure. OB History     Gravida  6   Para  3   Term  2   Preterm  1   AB  2   Living  3      SAB  1   IAB  1   Ectopic      Multiple  0   Live Births  3           Past Medical History:  Diagnosis Date   Hypertension    Preterm labor    STD (sexually transmitted disease)    Chlamydia    Past Surgical History:  Procedure Laterality Date   INDUCED ABORTION      Family History  Problem Relation Age of Onset   Hypertension Mother    Diabetes Father    Hypertension Father    Diabetes Paternal Uncle    Renal Disease Maternal Grandmother    Diabetes Paternal Grandfather     Social History   Tobacco Use   Smoking status: Former    Current packs/day: 0.00    Types: Cigarettes    Quit date: 01/26/2015    Years since quitting: 8.6   Smokeless tobacco: Never  Vaping Use   Vaping status: Never Used  Substance Use Topics   Alcohol use: Not Currently   Drug use: Not Currently    Types: Marijuana    Allergies: No Known Allergies  Medications Prior to Admission  Medication Sig Dispense Refill Last Dose   Hydrocortisone, Perianal, (PREPARATION H SOOTHING RELIEF) 1 % CREA Use three times a day as needed for hemorrhoid discomfort relief 25 g 0 10/05/2023   witch hazel-glycerin (TUCKS) pad Apply topically as needed for itching. 40 each 12 10/05/2023   calcium carbonate (TUMS EX) 750 MG chewable tablet Chew 1 tablet by mouth daily.       NIFEdipine (ADALAT CC) 30 MG 24 hr tablet Take 1 tablet (30 mg total) by mouth daily. 30 tablet 0     Review of Systems  HENT:  Negative for congestion.   Eyes:  Negative for visual disturbance.  Respiratory:  Positive for chest tightness. Negative for shortness of breath.   Cardiovascular:  Negative for chest pain.  Gastrointestinal:  Negative for constipation, nausea and vomiting.  Endocrine: Negative for polyuria.  Genitourinary:  Negative for vaginal discharge and vaginal pain.  Neurological:  Positive for headaches (comes and goes).   Physical Exam   Blood pressure (!) 149/94, pulse 95, temperature 98.2 F (36.8 C), temperature source Oral, resp. rate 19, height 5\' 3"  (1.6 m), weight 114.9 kg, last menstrual period 01/23/2023, SpO2 98%.  Physical Exam Vitals reviewed.  Constitutional:      Appearance: Normal appearance.  HENT:     Head: Normocephalic and atraumatic.     Right Ear: External ear normal.     Left Ear: External ear normal.  Mouth/Throat:     Mouth: Mucous membranes are moist.  Eyes:     Extraocular Movements: Extraocular movements intact.     Pupils: Pupils are equal, round, and reactive to light.  Cardiovascular:     Rate and Rhythm: Normal rate and regular rhythm.     Pulses: Normal pulses.  Pulmonary:     Effort: Pulmonary effort is normal.  Abdominal:     General: Abdomen is flat. There is no distension.     Palpations: Abdomen is soft.  Musculoskeletal:        General: Normal range of motion.     Cervical back: Normal range of motion.  Skin:    General: Skin is warm.     Capillary Refill: Capillary refill takes less than 2 seconds.  Neurological:     General: No focal deficit present.     Mental Status: She is alert.  Psychiatric:        Mood and Affect: Mood normal.     MAU Course  Procedures  MDM CBC CMP Protein creatinine ratio BPP NST   Assessment and Plan  Theresa Horne is a 32 yo G6 P2-1-2-3 at 36 weeks 3 days  presenting for abnormal BPP.  Abnormal BPP Patient had abnormal BPP at MFM office today.  When I called she was reporting other symptoms such as chest tightness.  On arrival elevated blood pressures.  Preeclampsia labs within normal limits.  Blood pressures remained mild range.  NST showing moderate variability, accelerations, no decelerations.  Repeat BPP done 8/8. - Patient will have an additional MFM appointment tomorrow morning - Spoke with on-call provider for practice and they will schedule for blood pressure check in the clinic - Strict MAU precautions given - Patient discharged home  Celedonio Savage 10/05/2023, 7:07 PM

## 2023-10-05 NOTE — MAU Note (Signed)
Theresa Horne is a 32 y.o. at [redacted]w[redacted]d here in MAU reporting: sent from MD office for fetal monitoring.  States had [name] today and "baby wasn't doing what she's supposed to be doing."  Denies VB or LOF.  Endorses +FM, but less than usual. LMP: NA Onset of complaint: today Pain score: 0 Vitals:   10/05/23 1628  BP: (!) 149/81  Pulse: (!) 109  Resp: 19  Temp: 98.2 F (36.8 C)  SpO2: 98%     FHT:153 bpm Lab orders placed from triage:  UA

## 2023-10-05 NOTE — Discharge Instructions (Signed)
It was great taking care of you tonight.  Your baby looked wonderful on the ultrasound.  Your blood pressures were elevated so and she to be sure to follow-up with your primary OB provider on that.  You have another appointment scheduled with maternal-fetal medicine tomorrow for an additional ultrasound that I want you to make sure to go to.  If you have any issues between now and then please return for further evaluation.  I hope you have a great night!

## 2023-10-05 NOTE — MAU Note (Signed)
Pulse ox waveform at times shows maternal pulse in the 40's however palpated pulse during some of these times reflects radial pulse in the 90's. Provider aware. Pulse ox cable changed.

## 2023-10-06 ENCOUNTER — Ambulatory Visit: Payer: BC Managed Care – PPO | Admitting: *Deleted

## 2023-10-06 ENCOUNTER — Ambulatory Visit: Payer: BC Managed Care – PPO | Attending: Maternal & Fetal Medicine

## 2023-10-06 ENCOUNTER — Other Ambulatory Visit: Payer: Self-pay

## 2023-10-06 VITALS — BP 138/70 | HR 52

## 2023-10-06 DIAGNOSIS — O10913 Unspecified pre-existing hypertension complicating pregnancy, third trimester: Secondary | ICD-10-CM | POA: Insufficient documentation

## 2023-10-06 DIAGNOSIS — O09899 Supervision of other high risk pregnancies, unspecified trimester: Secondary | ICD-10-CM

## 2023-10-06 DIAGNOSIS — Z3A36 36 weeks gestation of pregnancy: Secondary | ICD-10-CM | POA: Diagnosis not present

## 2023-10-06 DIAGNOSIS — Z3493 Encounter for supervision of normal pregnancy, unspecified, third trimester: Secondary | ICD-10-CM | POA: Insufficient documentation

## 2023-10-06 DIAGNOSIS — O09293 Supervision of pregnancy with other poor reproductive or obstetric history, third trimester: Secondary | ICD-10-CM | POA: Insufficient documentation

## 2023-10-06 DIAGNOSIS — O99213 Obesity complicating pregnancy, third trimester: Secondary | ICD-10-CM | POA: Insufficient documentation

## 2023-10-09 ENCOUNTER — Other Ambulatory Visit: Payer: Self-pay | Admitting: Obstetrics & Gynecology

## 2023-10-09 ENCOUNTER — Ambulatory Visit: Payer: BC Managed Care – PPO | Attending: Cardiology | Admitting: Cardiology

## 2023-10-09 ENCOUNTER — Encounter: Payer: Self-pay | Admitting: Cardiology

## 2023-10-09 VITALS — BP 110/78 | HR 76 | Ht 63.0 in | Wt 251.4 lb

## 2023-10-09 DIAGNOSIS — O133 Gestational [pregnancy-induced] hypertension without significant proteinuria, third trimester: Secondary | ICD-10-CM

## 2023-10-09 DIAGNOSIS — I491 Atrial premature depolarization: Secondary | ICD-10-CM | POA: Diagnosis not present

## 2023-10-09 DIAGNOSIS — Z3A37 37 weeks gestation of pregnancy: Secondary | ICD-10-CM

## 2023-10-09 NOTE — Progress Notes (Signed)
Cardio-Obstetrics Clinic  New Evaluation  Date:  10/09/2023   ID:  Theresa Horne, DOB Apr 09, 1991, MRN [id number]  PCP:  Patient, No Pcp Per   [facility] Providers Cardiologist:  Thomasene Ripple, DO  Electrophysiologist:  None       Referring MD: Sundra Aland, MD   Chief Complaint: " I am ok"  History of Present Illness:    Theresa Horne is a 32 y.o. female [[id number]] who is being seen today for the evaluation of low heart rate at the request of Sundra Aland, MD.   Medical history of frequent PACs followed with Dr. Jacques Navy back in 2022 at that time to discuss her monitor which showed frequent PACs and echo was normal.  Today she is [redacted] weeks pregnant and was referred for concerns of fluctuating heart rate.  . The heart rate was observed to drop to the fifties and then rise to over a hundred. The patient did not take the previously prescribed metoprolol for this condition. The patient does not report feeling like she is about to pass out but can feel her heart beating hard. The patient is also on nifedipine for blood pressure control, which she reports gives her headaches. The patient's blood pressure has been observed to rise in the afternoons, reaching the one forties and one thirties. The patient is currently [redacted] weeks pregnant.   Prior CV Studies Reviewed: The following studies were reviewed today:   Past Medical History:  Diagnosis Date   Hypertension    Preterm labor    STD (sexually transmitted disease)    Chlamydia    Past Surgical History:  Procedure Laterality Date   INDUCED ABORTION        OB History     Gravida  6   Para  3   Term  2   Preterm  1   AB  2   Living  3      SAB  1   IAB  1   Ectopic      Multiple  0   Live Births  3               Current Medications: Current Meds  Medication Sig   calcium carbonate (TUMS EX) 750 MG chewable tablet Chew 1 tablet by mouth daily.   Hydrocortisone, Perianal,  (PREPARATION H SOOTHING RELIEF) 1 % CREA Use three times a day as needed for hemorrhoid discomfort relief   NIFEdipine (ADALAT CC) 30 MG 24 hr tablet Take 1 tablet (30 mg total) by mouth daily.   witch hazel-glycerin (TUCKS) pad Apply topically as needed for itching.     Allergies:   Patient has no known allergies.   Social History   Socioeconomic History   Marital status: Single    Spouse name: Not on file   Number of children: Not on file   Years of education: Not on file   Highest education level: Not on file  Occupational History   Not on file  Tobacco Use   Smoking status: Former    Current packs/day: 0.00    Types: Cigarettes    Quit date: 01/26/2015    Years since quitting: 8.7   Smokeless tobacco: Never  Vaping Use   Vaping status: Never Used  Substance and Sexual Activity   Alcohol use: Not Currently   Drug use: Not Currently    Types: Marijuana   Sexual activity: Yes    Comment: pt currently pregnant  Other Topics Concern  Not on file  Social History Narrative   Not on file   Social Determinants of Health   Financial Resource Strain: Low Risk  (04/24/2019)   Overall Financial Resource Strain (CARDIA)    Difficulty of Paying Living Expenses: Not hard at all  Food Insecurity: No Food Insecurity (04/24/2019)   Hunger Vital Sign    Worried About Running Out of Food in the Last Year: Never true    Ran Out of Food in the Last Year: Never true  Transportation Needs: Unknown (04/24/2019)   PRAPARE - Administrator, Civil Service (Medical): No    Lack of Transportation (Non-Medical): Not on file  Physical Activity: Not on file  Stress: No Stress Concern Present (04/24/2019)   [employer] of Occupational Health - Occupational Stress Questionnaire    Feeling of Stress : Not at all  Social Connections: Not on file      Family History  Problem Relation Age of Onset   Hypertension Mother    Diabetes Father    Hypertension Father    Diabetes  Paternal Uncle    Renal Disease Maternal Grandmother    Diabetes Paternal Grandfather       ROS:   Please see the history of present illness.     All other systems reviewed and are negative.   Labs/EKG Reviewed:    EKG:   EKG was reviewed   Recent Labs: 10/05/2023: ALT 25; BUN 6; Creatinine, Ser 0.91; Hemoglobin 9.1; Platelets 140; Potassium 3.7; Sodium 134   Recent Lipid Panel No results found for: "CHOL", "TRIG", "HDL", "CHOLHDL", "LDLCALC", "LDLDIRECT"  Physical Exam:    VS:  BP 110/78 (BP Location: Right Arm, Patient Position: Sitting, Cuff Size: Normal)   Pulse 76   Ht 5\' 3"  (1.6 m)   Wt 251 lb 6.4 oz (114 kg)   LMP 01/23/2023   SpO2 98%   BMI 44.53 kg/m     Wt Readings from Last 3 Encounters:  10/09/23 251 lb 6.4 oz (114 kg)  10/05/23 253 lb 4.8 oz (114.9 kg)  09/25/23 255 lb (115.7 kg)     GEN:  Well nourished, well developed in no acute distress HEENT: Normal NECK: No JVD; No carotid bruits LYMPHATICS: No lymphadenopathy CARDIAC: RRR, no murmurs, rubs, gallops RESPIRATORY:  Clear to auscultation without rales, wheezing or rhonchi  ABDOMEN: Soft, non-tender, non-distended MUSCULOSKELETAL:  No edema; No deformity  SKIN: Warm and dry NEUROLOGIC:  Alert and oriented x 3 PSYCHIATRIC:  Normal affect    Risk Assessment/Risk Calculators:                 ASSESSMENT & PLAN:    Premature Atrial Contractions (PACs) in Pregnancy Patient at [redacted] weeks gestation with PACs causing heart rate to fluctuate between 50s and 100s. No new arrhythmia identified. No symptoms of significant shortness of breath, lightheadedness, or blurry vision. -Continue to monitor symptoms.  Hypertension in Pregnancy Patient on Nifedipine with occasional blood pressure readings in the 140s/130s in the afternoon.  At risk for accelerated postpartum hypertension Advise patient to take blood pressure twice daily and send readings on 10/16/2023 for review. -Plan for follow-up in 4  weeks postpartum to monitor blood pressure closely. -If delivery occurs before scheduled follow-up, patient to send a message so an earlier appointment can be arranged.  General Health Maintenance / Followup Plans -Encourage breastfeeding postpartum as it provides options for blood pressure management. -If patient does not deliver by next week, continue to take blood pressure  readings and send update on 10/16/2023. -Postpartum follow-up in 4 weeks to monitor blood pressure and manage postpartum care. If delivery occurs before this, patient to send a message to arrange an earlier appointment.   Patient Instructions  Please take your blood pressure daily for 1 week and send in a MyChart message. Please include heart rates. (One message at the end of the week)   HOW TO TAKE YOUR BLOOD PRESSURE: Rest 5 minutes before taking your blood pressure. Don't smoke or drink caffeinated beverages for at least 30 minutes before. Take your blood pressure before (not after) you eat. Sit comfortably with your back supported and both feet on the floor (don't cross your legs). Elevate your arm to heart level on a table or a desk. Use the proper sized cuff. It should fit smoothly and snugly around your bare upper arm. There should be enough room to slip a fingertip under the cuff. The bottom edge of the cuff should be 1 inch above the crease of the elbow. Ideally, take 3 measurements at one sitting and record the average.   Follow-Up: At [facility], you and your health needs are our priority.  As part of our continuing mission to provide you with exceptional heart care, we have created designated Provider Care Teams.  These Care Teams include your primary Cardiologist (physician) and Advanced Practice Providers (APPs -  Physician Assistants and Nurse Practitioners) who all work together to provide you with the care you need, when you need it.    Your next appointment:   4 week(s)  Provider:    Thomasene Ripple, DO    Dispo:  No follow-ups on file.   Medication Adjustments/Labs and Tests Ordered: Current medicines are reviewed at length with the patient today.  Concerns regarding medicines are outlined above.  Tests Ordered: No orders of the defined types were placed in this encounter.  Medication Changes: No orders of the defined types were placed in this encounter.

## 2023-10-09 NOTE — Patient Instructions (Signed)
Please take your blood pressure daily for 1 week and send in a MyChart message. Please include heart rates. (One message at the end of the week)   HOW TO TAKE YOUR BLOOD PRESSURE: Rest 5 minutes before taking your blood pressure. Don't smoke or drink caffeinated beverages for at least 30 minutes before. Take your blood pressure before (not after) you eat. Sit comfortably with your back supported and both feet on the floor (don't cross your legs). Elevate your arm to heart level on a table or a desk. Use the proper sized cuff. It should fit smoothly and snugly around your bare upper arm. There should be enough room to slip a fingertip under the cuff. The bottom edge of the cuff should be 1 inch above the crease of the elbow. Ideally, take 3 measurements at one sitting and record the average.   Follow-Up: At [facility], you and your health needs are our priority.  As part of our continuing mission to provide you with exceptional heart care, we have created designated Provider Care Teams.  These Care Teams include your primary Cardiologist (physician) and Advanced Practice Providers (APPs -  Physician Assistants and Nurse Practitioners) who all work together to provide you with the care you need, when you need it.    Your next appointment:   4 week(s)  Provider:   Thomasene Ripple, DO

## 2023-10-10 ENCOUNTER — Encounter (HOSPITAL_COMMUNITY): Payer: Self-pay | Admitting: *Deleted

## 2023-10-10 ENCOUNTER — Telehealth (HOSPITAL_COMMUNITY): Payer: Self-pay | Admitting: *Deleted

## 2023-10-10 NOTE — Telephone Encounter (Signed)
Preadmission screen  

## 2023-10-13 ENCOUNTER — Ambulatory Visit: Payer: BC Managed Care – PPO | Attending: Obstetrics

## 2023-10-13 ENCOUNTER — Other Ambulatory Visit: Payer: Self-pay

## 2023-10-13 ENCOUNTER — Ambulatory Visit: Payer: BC Managed Care – PPO | Admitting: *Deleted

## 2023-10-13 VITALS — BP 126/80 | HR 93

## 2023-10-13 DIAGNOSIS — O9902 Anemia complicating childbirth: Secondary | ICD-10-CM | POA: Diagnosis not present

## 2023-10-13 DIAGNOSIS — O289 Unspecified abnormal findings on antenatal screening of mother: Secondary | ICD-10-CM | POA: Insufficient documentation

## 2023-10-13 DIAGNOSIS — Z87891 Personal history of nicotine dependence: Secondary | ICD-10-CM | POA: Diagnosis not present

## 2023-10-13 DIAGNOSIS — O09213 Supervision of pregnancy with history of pre-term labor, third trimester: Secondary | ICD-10-CM

## 2023-10-13 DIAGNOSIS — Z79899 Other long term (current) drug therapy: Secondary | ICD-10-CM | POA: Diagnosis not present

## 2023-10-13 DIAGNOSIS — I10 Essential (primary) hypertension: Secondary | ICD-10-CM | POA: Diagnosis not present

## 2023-10-13 DIAGNOSIS — O09899 Supervision of other high risk pregnancies, unspecified trimester: Secondary | ICD-10-CM | POA: Insufficient documentation

## 2023-10-13 DIAGNOSIS — O9A23 Injury, poisoning and certain other consequences of external causes complicating the puerperium: Secondary | ICD-10-CM | POA: Diagnosis not present

## 2023-10-13 DIAGNOSIS — I493 Ventricular premature depolarization: Secondary | ICD-10-CM | POA: Diagnosis not present

## 2023-10-13 DIAGNOSIS — O10019 Pre-existing essential hypertension complicating pregnancy, unspecified trimester: Secondary | ICD-10-CM | POA: Diagnosis not present

## 2023-10-13 DIAGNOSIS — R002 Palpitations: Secondary | ICD-10-CM | POA: Diagnosis not present

## 2023-10-13 DIAGNOSIS — R079 Chest pain, unspecified: Secondary | ICD-10-CM | POA: Diagnosis present

## 2023-10-13 DIAGNOSIS — R9431 Abnormal electrocardiogram [ECG] [EKG]: Secondary | ICD-10-CM | POA: Diagnosis not present

## 2023-10-13 DIAGNOSIS — O99213 Obesity complicating pregnancy, third trimester: Secondary | ICD-10-CM | POA: Diagnosis not present

## 2023-10-13 DIAGNOSIS — Z8249 Family history of ischemic heart disease and other diseases of the circulatory system: Secondary | ICD-10-CM | POA: Diagnosis not present

## 2023-10-13 DIAGNOSIS — O872 Hemorrhoids in the puerperium: Secondary | ICD-10-CM | POA: Diagnosis not present

## 2023-10-13 DIAGNOSIS — D509 Iron deficiency anemia, unspecified: Secondary | ICD-10-CM | POA: Diagnosis not present

## 2023-10-13 DIAGNOSIS — R0602 Shortness of breath: Secondary | ICD-10-CM | POA: Diagnosis present

## 2023-10-13 DIAGNOSIS — Z3A38 38 weeks gestation of pregnancy: Secondary | ICD-10-CM | POA: Diagnosis not present

## 2023-10-13 DIAGNOSIS — Z833 Family history of diabetes mellitus: Secondary | ICD-10-CM | POA: Diagnosis not present

## 2023-10-13 DIAGNOSIS — L539 Erythematous condition, unspecified: Secondary | ICD-10-CM | POA: Diagnosis not present

## 2023-10-13 DIAGNOSIS — Z302 Encounter for sterilization: Secondary | ICD-10-CM | POA: Diagnosis not present

## 2023-10-13 DIAGNOSIS — O99824 Streptococcus B carrier state complicating childbirth: Secondary | ICD-10-CM | POA: Diagnosis not present

## 2023-10-13 DIAGNOSIS — Z23 Encounter for immunization: Secondary | ICD-10-CM | POA: Diagnosis not present

## 2023-10-13 DIAGNOSIS — O09293 Supervision of pregnancy with other poor reproductive or obstetric history, third trimester: Secondary | ICD-10-CM | POA: Diagnosis not present

## 2023-10-13 DIAGNOSIS — E669 Obesity, unspecified: Secondary | ICD-10-CM | POA: Diagnosis not present

## 2023-10-13 DIAGNOSIS — O9942 Diseases of the circulatory system complicating childbirth: Secondary | ICD-10-CM | POA: Diagnosis not present

## 2023-10-13 DIAGNOSIS — Z4889 Encounter for other specified surgical aftercare: Secondary | ICD-10-CM | POA: Diagnosis not present

## 2023-10-13 DIAGNOSIS — T8059XA Anaphylactic reaction due to other serum, initial encounter: Secondary | ICD-10-CM | POA: Diagnosis not present

## 2023-10-13 DIAGNOSIS — Z051 Observation and evaluation of newborn for suspected infectious condition ruled out: Secondary | ICD-10-CM | POA: Diagnosis not present

## 2023-10-13 DIAGNOSIS — Z3A37 37 weeks gestation of pregnancy: Secondary | ICD-10-CM

## 2023-10-13 DIAGNOSIS — O114 Pre-existing hypertension with pre-eclampsia, complicating childbirth: Secondary | ICD-10-CM | POA: Diagnosis not present

## 2023-10-13 DIAGNOSIS — T782XXA Anaphylactic shock, unspecified, initial encounter: Secondary | ICD-10-CM | POA: Diagnosis not present

## 2023-10-13 DIAGNOSIS — O1092 Unspecified pre-existing hypertension complicating childbirth: Secondary | ICD-10-CM | POA: Diagnosis not present

## 2023-10-13 DIAGNOSIS — T454X5A Adverse effect of iron and its compounds, initial encounter: Secondary | ICD-10-CM | POA: Diagnosis not present

## 2023-10-13 DIAGNOSIS — O99214 Obesity complicating childbirth: Secondary | ICD-10-CM | POA: Diagnosis not present

## 2023-10-16 ENCOUNTER — Inpatient Hospital Stay (HOSPITAL_COMMUNITY)
Admission: RE | Admit: 2023-10-16 | Discharge: 2023-10-22 | DRG: 798 | Disposition: A | Discharge status: Home / Self Care | Payer: BC Managed Care – PPO | Attending: Obstetrics & Gynecology | Admitting: Obstetrics & Gynecology

## 2023-10-16 ENCOUNTER — Encounter (HOSPITAL_COMMUNITY): Payer: Self-pay | Admitting: Obstetrics & Gynecology

## 2023-10-16 ENCOUNTER — Other Ambulatory Visit: Payer: Self-pay

## 2023-10-16 ENCOUNTER — Inpatient Hospital Stay (HOSPITAL_COMMUNITY): Payer: BC Managed Care – PPO

## 2023-10-16 ENCOUNTER — Inpatient Hospital Stay (HOSPITAL_COMMUNITY): Payer: BC Managed Care – PPO | Admitting: Anesthesiology

## 2023-10-16 DIAGNOSIS — O872 Hemorrhoids in the puerperium: Secondary | ICD-10-CM | POA: Diagnosis not present

## 2023-10-16 DIAGNOSIS — Z87891 Personal history of nicotine dependence: Secondary | ICD-10-CM | POA: Diagnosis not present

## 2023-10-16 DIAGNOSIS — Z8249 Family history of ischemic heart disease and other diseases of the circulatory system: Secondary | ICD-10-CM | POA: Diagnosis not present

## 2023-10-16 DIAGNOSIS — O1092 Unspecified pre-existing hypertension complicating childbirth: Secondary | ICD-10-CM | POA: Diagnosis present

## 2023-10-16 DIAGNOSIS — Z79899 Other long term (current) drug therapy: Secondary | ICD-10-CM | POA: Diagnosis not present

## 2023-10-16 DIAGNOSIS — O114 Pre-existing hypertension with pre-eclampsia, complicating childbirth: Secondary | ICD-10-CM | POA: Diagnosis present

## 2023-10-16 DIAGNOSIS — I10 Essential (primary) hypertension: Secondary | ICD-10-CM | POA: Diagnosis not present

## 2023-10-16 DIAGNOSIS — O99214 Obesity complicating childbirth: Secondary | ICD-10-CM | POA: Diagnosis present

## 2023-10-16 DIAGNOSIS — L539 Erythematous condition, unspecified: Secondary | ICD-10-CM | POA: Diagnosis not present

## 2023-10-16 DIAGNOSIS — T8059XA Anaphylactic reaction due to other serum, initial encounter: Secondary | ICD-10-CM | POA: Diagnosis not present

## 2023-10-16 DIAGNOSIS — R0602 Shortness of breath: Secondary | ICD-10-CM | POA: Diagnosis present

## 2023-10-16 DIAGNOSIS — O9A23 Injury, poisoning and certain other consequences of external causes complicating the puerperium: Secondary | ICD-10-CM | POA: Diagnosis not present

## 2023-10-16 DIAGNOSIS — O9942 Diseases of the circulatory system complicating childbirth: Secondary | ICD-10-CM | POA: Diagnosis present

## 2023-10-16 DIAGNOSIS — I493 Ventricular premature depolarization: Secondary | ICD-10-CM | POA: Diagnosis present

## 2023-10-16 DIAGNOSIS — Z833 Family history of diabetes mellitus: Secondary | ICD-10-CM | POA: Diagnosis not present

## 2023-10-16 DIAGNOSIS — Z3A38 38 weeks gestation of pregnancy: Secondary | ICD-10-CM

## 2023-10-16 DIAGNOSIS — R002 Palpitations: Secondary | ICD-10-CM | POA: Diagnosis not present

## 2023-10-16 DIAGNOSIS — R079 Chest pain, unspecified: Secondary | ICD-10-CM | POA: Diagnosis present

## 2023-10-16 DIAGNOSIS — I499 Cardiac arrhythmia, unspecified: Secondary | ICD-10-CM | POA: Diagnosis not present

## 2023-10-16 DIAGNOSIS — O9902 Anemia complicating childbirth: Secondary | ICD-10-CM | POA: Diagnosis present

## 2023-10-16 DIAGNOSIS — D509 Iron deficiency anemia, unspecified: Secondary | ICD-10-CM | POA: Diagnosis present

## 2023-10-16 DIAGNOSIS — O169 Unspecified maternal hypertension, unspecified trimester: Secondary | ICD-10-CM | POA: Diagnosis present

## 2023-10-16 DIAGNOSIS — T454X5A Adverse effect of iron and its compounds, initial encounter: Secondary | ICD-10-CM | POA: Diagnosis not present

## 2023-10-16 DIAGNOSIS — Z302 Encounter for sterilization: Secondary | ICD-10-CM | POA: Diagnosis not present

## 2023-10-16 DIAGNOSIS — O99824 Streptococcus B carrier state complicating childbirth: Secondary | ICD-10-CM | POA: Diagnosis present

## 2023-10-16 DIAGNOSIS — Z3490 Encounter for supervision of normal pregnancy, unspecified, unspecified trimester: Secondary | ICD-10-CM

## 2023-10-16 DIAGNOSIS — O119 Pre-existing hypertension with pre-eclampsia, unspecified trimester: Secondary | ICD-10-CM | POA: Diagnosis present

## 2023-10-16 DIAGNOSIS — R9431 Abnormal electrocardiogram [ECG] [EKG]: Secondary | ICD-10-CM | POA: Diagnosis not present

## 2023-10-16 LAB — TYPE AND SCREEN
ABO/RH(D): O POS
Antibody Screen: NEGATIVE

## 2023-10-16 LAB — COMPREHENSIVE METABOLIC PANEL
ALT: 26 U/L (ref 0–44)
AST: 22 U/L (ref 15–41)
Albumin: 2.9 g/dL — ABNORMAL LOW (ref 3.5–5.0)
Alkaline Phosphatase: 91 U/L (ref 38–126)
Anion gap: 11 (ref 5–15)
BUN: 10 mg/dL (ref 6–20)
CO2: 18 mmol/L — ABNORMAL LOW (ref 22–32)
Calcium: 9.7 mg/dL (ref 8.9–10.3)
Chloride: 105 mmol/L (ref 98–111)
Creatinine, Ser: 0.85 mg/dL (ref 0.44–1.00)
GFR, Estimated: >60 mL/min (ref >60)
Glucose, Bld: 82 mg/dL (ref 70–99)
Potassium: 3.7 mmol/L (ref 3.5–5.1)
Sodium: 134 mmol/L — ABNORMAL LOW (ref 135–145)
Total Bilirubin: 0.5 mg/dL (ref <1.2)
Total Protein: 6.8 g/dL (ref 6.5–8.1)

## 2023-10-16 LAB — RPR: RPR Ser Ql: NONREACTIVE

## 2023-10-16 LAB — CBC
HCT: 30.9 % — ABNORMAL LOW (ref 36.0–46.0)
Hemoglobin: 9.6 g/dL — ABNORMAL LOW (ref 12.0–15.0)
MCH: 25.9 pg — ABNORMAL LOW (ref 26.0–34.0)
MCHC: 31.1 g/dL (ref 30.0–36.0)
MCV: 83.3 fL (ref 80.0–100.0)
Platelets: 167 10*3/uL (ref 150–400)
RBC: 3.71 MIL/uL — ABNORMAL LOW (ref 3.87–5.11)
RDW: 14.6 % (ref 11.5–15.5)
WBC: 7.4 10*3/uL (ref 4.0–10.5)
nRBC: 0 % (ref 0.0–0.2)

## 2023-10-16 MED ORDER — PHENYLEPHRINE 80 MCG/ML (10ML) SYRINGE FOR IV PUSH (FOR BLOOD PRESSURE SUPPORT): 80.0000 ug | PREFILLED_SYRINGE | INTRAVENOUS | Status: DC | PRN

## 2023-10-16 MED ORDER — FLEET ENEMA RE ENEM
1.0000 | ENEMA | RECTAL | Status: DC | PRN
Start: 2023-10-16 — End: 2023-10-16

## 2023-10-16 MED ORDER — DIBUCAINE (PERIANAL) 1 % EX OINT
1.0000 | TOPICAL_OINTMENT | CUTANEOUS | Status: DC | PRN
  Administered 2023-10-18: 1 via RECTAL
  Filled 2023-10-16: qty 28

## 2023-10-16 MED ORDER — IBUPROFEN 600 MG PO TABS
600.0000 mg | ORAL_TABLET | Freq: Four times a day (QID) | ORAL | Status: DC
  Administered 2023-10-16 – 2023-10-22 (×20): 600 mg via ORAL
  Filled 2023-10-16 (×21): qty 1

## 2023-10-16 MED ORDER — ONDANSETRON HCL 4 MG/2ML IJ SOLN: 4.0000 mg | INTRAMUSCULAR | Status: DC | PRN

## 2023-10-16 MED ORDER — PENICILLIN G POT IN DEXTROSE 60000 UNIT/ML IV SOLN
3.0000 10*6.[IU] | INTRAVENOUS | Status: DC
  Administered 2023-10-16 (×2): 3 10*6.[IU] via INTRAVENOUS
  Filled 2023-10-16 (×2): qty 50

## 2023-10-16 MED ORDER — DIPHENHYDRAMINE HCL 50 MG/ML IJ SOLN: 12.5000 mg | INTRAMUSCULAR | Status: DC | PRN

## 2023-10-16 MED ORDER — LIDOCAINE HCL (PF) 1 % IJ SOLN
INTRAMUSCULAR | Status: DC | PRN
  Administered 2023-10-16: 5 mL via EPIDURAL
  Administered 2023-10-16: 3 mL via EPIDURAL
  Administered 2023-10-16: 2 mL via EPIDURAL

## 2023-10-16 MED ORDER — MISOPROSTOL 200 MCG PO TABS
800.0000 ug | ORAL_TABLET | Freq: Once | ORAL | Status: AC
  Administered 2023-10-16: 800 ug via RECTAL

## 2023-10-16 MED ORDER — FENTANYL-BUPIVACAINE-NACL 0.5-0.125-0.9 MG/250ML-% EP SOLN
12.0000 mL/h | EPIDURAL | Status: DC | PRN
  Administered 2023-10-16: 12 mL/h via EPIDURAL
  Filled 2023-10-16: qty 250

## 2023-10-16 MED ORDER — LACTATED RINGERS IV SOLN: INTRAVENOUS | Status: DC

## 2023-10-16 MED ORDER — ZOLPIDEM TARTRATE 5 MG PO TABS: 5.0000 mg | ORAL_TABLET | Freq: Every evening | ORAL | Status: DC | PRN

## 2023-10-16 MED ORDER — METOCLOPRAMIDE HCL 10 MG PO TABS: 10.0000 mg | ORAL_TABLET | Freq: Once | ORAL | Status: DC

## 2023-10-16 MED ORDER — LACTATED RINGERS IV SOLN: 500.0000 mL | Freq: Once | INTRAVENOUS | Status: DC

## 2023-10-16 MED ORDER — EPHEDRINE 5 MG/ML INJ: 10.0000 mg | INTRAVENOUS | Status: DC | PRN

## 2023-10-16 MED ORDER — ACETAMINOPHEN 325 MG PO TABS
650.0000 mg | ORAL_TABLET | ORAL | Status: DC | PRN
Start: 2023-10-16 — End: 2023-10-16

## 2023-10-16 MED ORDER — MISOPROSTOL 200 MCG PO TABS
ORAL_TABLET | ORAL | Status: AC
  Filled 2023-10-16: qty 4

## 2023-10-16 MED ORDER — LIDOCAINE HCL (PF) 1 % IJ SOLN: 30.0000 mL | INTRAMUSCULAR | Status: DC | PRN

## 2023-10-16 MED ORDER — SIMETHICONE 80 MG PO CHEW: 80.0000 mg | CHEWABLE_TABLET | ORAL | Status: DC | PRN

## 2023-10-16 MED ORDER — COCONUT OIL OIL: 1.0000 | TOPICAL_OIL | Status: DC | PRN

## 2023-10-16 MED ORDER — PRENATAL MULTIVITAMIN CH
1.0000 | ORAL_TABLET | Freq: Every day | ORAL | Status: DC
  Administered 2023-10-20 – 2023-10-22 (×3): 1 via ORAL
  Filled 2023-10-16 (×5): qty 1

## 2023-10-16 MED ORDER — ONDANSETRON HCL 4 MG/2ML IJ SOLN: 4.0000 mg | Freq: Four times a day (QID) | INTRAMUSCULAR | Status: DC | PRN

## 2023-10-16 MED ORDER — TRANEXAMIC ACID-NACL 1000-0.7 MG/100ML-% IV SOLN: 1000.0000 mg | INTRAVENOUS | Status: DC

## 2023-10-16 MED ORDER — TERBUTALINE SULFATE 1 MG/ML IJ SOLN: 0.2500 mg | Freq: Once | INTRAMUSCULAR | Status: DC | PRN

## 2023-10-16 MED ORDER — TRANEXAMIC ACID-NACL 1000-0.7 MG/100ML-% IV SOLN
INTRAVENOUS | Status: AC
  Administered 2023-10-16: 1000 mg
  Filled 2023-10-16: qty 100

## 2023-10-16 MED ORDER — ACETAMINOPHEN 325 MG PO TABS
650.0000 mg | ORAL_TABLET | ORAL | Status: DC | PRN
  Administered 2023-10-17 (×2): 650 mg via ORAL
  Filled 2023-10-16 (×2): qty 2

## 2023-10-16 MED ORDER — WITCH HAZEL-GLYCERIN EX PADS
1.0000 | MEDICATED_PAD | CUTANEOUS | Status: DC | PRN
  Administered 2023-10-18: 1 via TOPICAL

## 2023-10-16 MED ORDER — ONDANSETRON HCL 4 MG PO TABS: 4.0000 mg | ORAL_TABLET | ORAL | Status: DC | PRN

## 2023-10-16 MED ORDER — OXYTOCIN BOLUS FROM INFUSION
333.0000 mL | Freq: Once | INTRAVENOUS | Status: AC
  Administered 2023-10-16: 333 mL via INTRAVENOUS

## 2023-10-16 MED ORDER — OXYTOCIN-SODIUM CHLORIDE 30-0.9 UT/500ML-% IV SOLN
2.5000 [IU]/h | INTRAVENOUS | Status: DC
  Filled 2023-10-16: qty 500

## 2023-10-16 MED ORDER — TETANUS-DIPHTH-ACELL PERTUSSIS 5-2.5-18.5 LF-MCG/0.5 IM SUSY: 0.5000 mL | PREFILLED_SYRINGE | Freq: Once | INTRAMUSCULAR | Status: DC

## 2023-10-16 MED ORDER — BENZOCAINE-MENTHOL 20-0.5 % EX AERO
1.0000 | INHALATION_SPRAY | CUTANEOUS | Status: DC | PRN
  Administered 2023-10-18: 1 via TOPICAL
  Filled 2023-10-16: qty 56

## 2023-10-16 MED ORDER — LACTATED RINGERS IV SOLN: 500.0000 mL | INTRAVENOUS | Status: DC | PRN

## 2023-10-16 MED ORDER — DIPHENHYDRAMINE HCL 25 MG PO CAPS
25.0000 mg | ORAL_CAPSULE | Freq: Four times a day (QID) | ORAL | Status: DC | PRN
  Administered 2023-10-17: 25 mg via ORAL
  Filled 2023-10-16 (×2): qty 1

## 2023-10-16 MED ORDER — FAMOTIDINE 20 MG PO TABS: 40.0000 mg | ORAL_TABLET | Freq: Once | ORAL | Status: DC

## 2023-10-16 MED ORDER — SODIUM CHLORIDE 0.9 % IV SOLN
5.0000 10*6.[IU] | Freq: Once | INTRAVENOUS | Status: AC
  Administered 2023-10-16: 5 10*6.[IU] via INTRAVENOUS
  Filled 2023-10-16: qty 5

## 2023-10-16 MED ORDER — METOCLOPRAMIDE HCL 10 MG PO TABS
10.0000 mg | ORAL_TABLET | Freq: Once | ORAL | Status: AC
  Administered 2023-10-17: 10 mg via ORAL
  Filled 2023-10-16: qty 1

## 2023-10-16 MED ORDER — SOD CITRATE-CITRIC ACID 500-334 MG/5ML PO SOLN: 30.0000 mL | ORAL | Status: DC | PRN

## 2023-10-16 MED ORDER — FENTANYL CITRATE (PF) 100 MCG/2ML IJ SOLN
50.0000 ug | INTRAMUSCULAR | Status: DC | PRN
Start: 2023-10-16 — End: 2023-10-16

## 2023-10-16 MED ORDER — SENNOSIDES-DOCUSATE SODIUM 8.6-50 MG PO TABS
2.0000 | ORAL_TABLET | Freq: Every day | ORAL | Status: DC
  Administered 2023-10-18 – 2023-10-21 (×4): 2 via ORAL
  Filled 2023-10-16 (×4): qty 2

## 2023-10-16 MED ORDER — OXYTOCIN-SODIUM CHLORIDE 30-0.9 UT/500ML-% IV SOLN
1.0000 m[IU]/min | INTRAVENOUS | Status: DC
Start: 2023-10-16 — End: 2023-10-16
  Administered 2023-10-16: 2 m[IU]/min via INTRAVENOUS

## 2023-10-16 MED ORDER — FAMOTIDINE 20 MG PO TABS
40.0000 mg | ORAL_TABLET | Freq: Once | ORAL | Status: DC
  Filled 2023-10-16: qty 2

## 2023-10-16 NOTE — Progress Notes (Signed)
Theresa Horne is a 32 y.o. [id number]   Subjective: Comfortable s/p epidural  Objective: BP 132/67   Pulse 85   Temp 98 F (36.7 C) (Oral)   Resp 16   Ht 5\' 3"  (1.6 m)   Wt 111.5 kg   LMP 01/23/2023   SpO2 100%   BMI 43.56 kg/m  No intake/output data recorded. No intake/output data recorded.  FHT:  FHR: 135 bpm, variability: moderate,  accelerations:  Present,  decelerations:  Absent UC:   regular, every 2-3 minutes SVE:   Dilation: 6 Effacement (%): 50 Station: -2 Exam by:: Lance Morin, MD AROM clear fluid  Labs: Lab Results  Component Value Date   WBC 7.4 10/16/2023   HGB 9.6 (L) 10/16/2023   HCT 30.9 (L) 10/16/2023   MCV 83.3 10/16/2023   PLT 167 10/16/2023    Assessment / Plan: Induction of labor due to [facility],  progressing well on pitocin  Labor:  Cont to titrate pitocin as indicated Preeclampsia:  no signs or symptoms of toxicity Fetal Wellbeing:  Category I Pain Control:  Epidural I/D:   GBS + on PCN Anticipated MOD:  NSVD  Purcell Nails, MD 10/16/2023, 2:25 PM

## 2023-10-16 NOTE — Anesthesia Preprocedure Evaluation (Signed)
Anesthesia Evaluation  Patient identified by MRN, date of birth, ID band Patient awake    Reviewed: Allergy & Precautions, NPO status , Patient's Chart, lab work & pertinent test results  Airway Mallampati: III  TM Distance: >3 FB Neck ROM: Full    Dental  (+) Teeth Intact, Dental Advisory Given   Pulmonary former smoker   Pulmonary exam normal breath sounds clear to auscultation       Cardiovascular hypertension, Pt. on medications Normal cardiovascular exam Rhythm:Regular Rate:Normal     Neuro/Psych negative neurological ROS     GI/Hepatic negative GI ROS, Neg liver ROS,,,  Endo/Other    Class 4 obesity  Renal/GU negative Renal ROS     Musculoskeletal negative musculoskeletal ROS (+)    Abdominal   Peds  Hematology  (+) Blood dyscrasia, anemia Plt 167k   Anesthesia Other Findings Day of surgery medications reviewed with the patient.  Reproductive/Obstetrics (+) Pregnancy                              Anesthesia Physical Anesthesia Plan  ASA: 3  Anesthesia Plan: Epidural   Post-op Pain Management:    Induction:   PONV Risk Score and Plan: 2 and Treatment may vary due to age or medical condition  Airway Management Planned: Natural Airway  Additional Equipment:   Intra-op Plan:   Post-operative Plan:   Informed Consent: I have reviewed the patients History and Physical, chart, labs and discussed the procedure including the risks, benefits and alternatives for the proposed anesthesia with the patient or authorized representative who has indicated his/her understanding and acceptance.     Dental advisory given  Plan Discussed with:   Anesthesia Plan Comments: (Patient identified. Risks/Benefits/Options discussed with patient including but not limited to bleeding, infection, nerve damage, paralysis, failed block, incomplete pain control, headache, blood pressure changes,  nausea, vomiting, reactions to medication both or allergic, itching and postpartum back pain. Confirmed with bedside nurse the patient's most recent platelet count. Confirmed with patient that they are not currently taking any anticoagulation, have any bleeding history or any family history of bleeding disorders. Patient expressed understanding and wished to proceed. All questions were answered. )         Anesthesia Quick Evaluation

## 2023-10-16 NOTE — Lactation Note (Signed)
This note was copied from a baby's chart. Lactation Consultation Note  Patient Name: Theresa Horne Today's Date: 10/16/2023 Age:32 hours [name] declined LC services on MBU.    Maternal Data    Feeding    LATCH Score Latch: Grasps breast easily, tongue down, lips flanged, rhythmical sucking.  Audible Swallowing: Spontaneous and intermittent  Type of Nipple: Everted at rest and after stimulation  Comfort (Breast/Nipple): Soft / non-tender  Hold (Positioning): No assistance needed to correctly position infant at breast.  LATCH Score: 10   Lactation Tools Discussed/Used    Interventions    Discharge    Consult Status      Frederico Hamman 10/16/2023, 8:25 PM

## 2023-10-16 NOTE — H&P (Signed)
Theresa Horne is a 32 y.o. female presenting for IOL d/t CHTN and elevated BMI. OB History     Gravida  6   Para  3   Term  2   Preterm  1   AB  2   Living  3      SAB  1   IAB  1   Ectopic      Multiple  0   Live Births  3          Past Medical History:  Diagnosis Date   Hypertension    Preterm labor    STD (sexually transmitted disease)    Chlamydia   Past Surgical History:  Procedure Laterality Date   INDUCED ABORTION     Family History: family history includes Diabetes in her father, paternal grandfather, and paternal uncle; Hypertension in her father and mother; Renal Disease in her maternal grandmother. Social History:  reports that she quit smoking about 8 years ago. Her smoking use included cigarettes. She has never used smokeless tobacco. She reports that she does not currently use alcohol. She reports that she does not currently use drugs after having used the following drugs: Marijuana.     Maternal Diabetes: No Genetic Screening: Normal Maternal Ultrasounds/Referrals: Normal Fetal Ultrasounds or other Referrals:  None Maternal Substance Abuse:  No Significant Maternal Medications:  Meds include: Other: procardia xl 30mg  which was d/c'd d/t SE of HAs and changed to labetalol 09-27-23 Significant Maternal Lab Results:  Group B Strep positive Number of Prenatal Visits:greater than 3 verified prenatal visits Maternal Vaccinations:TDap, Flu, and Covid 08/18/23, 09/13/23 and 09/28/20 respectively Other Comments:  None  Review of Systems No F/C/N/V/D  History   Blood pressure 132/71, pulse 78, temperature 98 F (36.7 C), temperature source Oral, resp. rate 16, height 5\' 3"  (1.6 m), weight 111.5 kg, last menstrual period 01/23/2023. Exam Physical Exam  Lungs unlabored CV RRR Abdomen gravid, NT Extremities no calf tenderness  FHT 150, +accel, no decel, mod variability Toco irregular ctxs VE per RN Selena Batten F. Ext os 5cms and internal os  closed  Prenatal labs: ABO, Rh: --/--/O POS (11/18 8119) Antibody: NEG (11/18 0620) Rubella: Immune (05/02 0000) RPR: Nonreactive (05/02 0000)  HBsAg: Negative (05/02 0000)  HIV: Non-reactive (05/02 0000)  GBS:  Positive  09/22/23 5lbs 3oz 33%  Assessment/Plan: 31yo [id number] at 38wks admitted for IOL d/t CHTN on labetalol 100mg  BID changed from procardia xl 30mg  on 09/27/23 d/t HAs from procardia.  BP medicine started late in the pregnancy.  Pt had been controlled without medication.  Pain medication upon request.  PCN for GBS prophylaxis.  Fetal status is reassuring with cat 1 tracing.  Purcell Nails 10/16/2023, 7:45 AM

## 2023-10-16 NOTE — Anesthesia Procedure Notes (Signed)
Epidural Patient location during procedure: OB Start time: 10/16/2023 9:35 AM End time: 10/16/2023 9:42 AM  Staffing Anesthesiologist: Collene Schlichter, MD Performed: anesthesiologist   Preanesthetic Checklist Completed: patient identified, IV checked, risks and benefits discussed, monitors and equipment checked, pre-op evaluation and timeout performed  Epidural Patient position: sitting Prep: DuraPrep Patient monitoring: blood pressure and continuous pulse ox Approach: midline Location: L3-L4 Injection technique: LOR air  Needle:  Needle type: Tuohy  Needle gauge: 17 G Needle length: 9 cm Needle insertion depth: 7 cm Catheter size: 19 Gauge Catheter at skin depth: 12 cm Test dose: negative and Other (1% Lidocaine)  Additional Notes Patient identified.  Risk benefits discussed including failed block, incomplete pain control, headache, nerve damage, paralysis, blood pressure changes, nausea, vomiting, reactions to medication both toxic or allergic, and postpartum back pain.  Patient expressed understanding and wished to proceed.  All questions were answered.  Sterile technique used throughout procedure and epidural site dressed with sterile barrier dressing. No paresthesia or other complications noted. The patient did not experience any signs of intravascular injection such as tinnitus or metallic taste in mouth nor signs of intrathecal spread such as rapid motor block. Please see nursing notes for vital signs. Reason for block:procedure for pain

## 2023-10-17 ENCOUNTER — Inpatient Hospital Stay (HOSPITAL_COMMUNITY): Payer: BC Managed Care – PPO | Admitting: Anesthesiology

## 2023-10-17 ENCOUNTER — Encounter (HOSPITAL_COMMUNITY)
Admission: RE | Disposition: A | Discharge status: Home / Self Care | Payer: Self-pay | Source: Home / Self Care | Attending: Obstetrics & Gynecology

## 2023-10-17 ENCOUNTER — Other Ambulatory Visit: Payer: Self-pay

## 2023-10-17 ENCOUNTER — Encounter (HOSPITAL_COMMUNITY): Payer: Self-pay | Admitting: Obstetrics & Gynecology

## 2023-10-17 HISTORY — PX: TUBAL LIGATION: SHX77

## 2023-10-17 LAB — COMPREHENSIVE METABOLIC PANEL
ALT: 20 U/L (ref 0–44)
AST: 21 U/L (ref 15–41)
Albumin: 2.2 g/dL — ABNORMAL LOW (ref 3.5–5.0)
Alkaline Phosphatase: 69 U/L (ref 38–126)
Anion gap: 9 (ref 5–15)
BUN: 8 mg/dL (ref 6–20)
CO2: 15 mmol/L — ABNORMAL LOW (ref 22–32)
Calcium: 8.1 mg/dL — ABNORMAL LOW (ref 8.9–10.3)
Chloride: 109 mmol/L (ref 98–111)
Creatinine, Ser: 0.97 mg/dL (ref 0.44–1.00)
GFR, Estimated: >60 mL/min (ref >60)
Glucose, Bld: 141 mg/dL — ABNORMAL HIGH (ref 70–99)
Potassium: 4.3 mmol/L (ref 3.5–5.1)
Sodium: 133 mmol/L — ABNORMAL LOW (ref 135–145)
Total Bilirubin: 0.5 mg/dL (ref <1.2)
Total Protein: 5.6 g/dL — ABNORMAL LOW (ref 6.5–8.1)

## 2023-10-17 LAB — CBC
HCT: 25.5 % — ABNORMAL LOW (ref 36.0–46.0)
HCT: 41.1 % (ref 36.0–46.0)
Hemoglobin: 8 g/dL — ABNORMAL LOW (ref 12.0–15.0)
Hemoglobin: 13.1 g/dL (ref 12.0–15.0)
MCH: 25.6 pg — ABNORMAL LOW (ref 26.0–34.0)
MCH: 25.8 pg — ABNORMAL LOW (ref 26.0–34.0)
MCHC: 31.4 g/dL (ref 30.0–36.0)
MCHC: 31.9 g/dL (ref 30.0–36.0)
MCV: 81.5 fL (ref 80.0–100.0)
MCV: 81.1 fL (ref 80.0–100.0)
Platelets: 137 10*3/uL — ABNORMAL LOW (ref 150–400)
Platelets: 217 10*3/uL (ref 150–400)
RBC: 3.13 MIL/uL — ABNORMAL LOW (ref 3.87–5.11)
RBC: 5.07 MIL/uL (ref 3.87–5.11)
RDW: 14.6 % (ref 11.5–15.5)
RDW: 14.7 % (ref 11.5–15.5)
WBC: 8.5 10*3/uL (ref 4.0–10.5)
WBC: 16.5 10*3/uL — ABNORMAL HIGH (ref 4.0–10.5)
nRBC: 0 % (ref 0.0–0.2)
nRBC: 0 % (ref 0.0–0.2)

## 2023-10-17 LAB — BIRTH TISSUE RECOVERY COLLECTION (PLACENTA DONATION)

## 2023-10-17 SURGERY — LIGATION, FALLOPIAN TUBE, POSTPARTUM
Anesthesia: Epidural

## 2023-10-17 MED ORDER — OXYCODONE HCL 5 MG/5ML PO SOLN: 5.0000 mg | Freq: Once | ORAL | Status: DC | PRN

## 2023-10-17 MED ORDER — LIDOCAINE HCL (PF) 1 % IJ SOLN
INTRAMUSCULAR | Status: AC
  Filled 2023-10-17: qty 30

## 2023-10-17 MED ORDER — EPHEDRINE 5 MG/ML INJ
INTRAVENOUS | Status: AC
  Filled 2023-10-17: qty 5

## 2023-10-17 MED ORDER — LIDOCAINE-EPINEPHRINE (PF) 2 %-1:200000 IJ SOLN
INTRAMUSCULAR | Status: AC
  Filled 2023-10-17: qty 20

## 2023-10-17 MED ORDER — OXYCODONE HCL 5 MG PO TABS: 5.0000 mg | ORAL_TABLET | Freq: Once | ORAL | Status: DC | PRN

## 2023-10-17 MED ORDER — FENTANYL CITRATE (PF) 100 MCG/2ML IJ SOLN
INTRAMUSCULAR | Status: AC
  Filled 2023-10-17: qty 2

## 2023-10-17 MED ORDER — FENTANYL CITRATE (PF) 100 MCG/2ML IJ SOLN
INTRAMUSCULAR | Status: DC | PRN
  Administered 2023-10-17: 100 ug via EPIDURAL

## 2023-10-17 MED ORDER — LIDOCAINE HCL 1 % IJ SOLN
INTRAMUSCULAR | Status: AC
  Filled 2023-10-17: qty 20

## 2023-10-17 MED ORDER — ONDANSETRON HCL 4 MG/2ML IJ SOLN
INTRAMUSCULAR | Status: AC
  Filled 2023-10-17: qty 2

## 2023-10-17 MED ORDER — HYDROCHLOROTHIAZIDE 25 MG PO TABS: 25.0000 mg | ORAL_TABLET | Freq: Every day | ORAL | Status: DC

## 2023-10-17 MED ORDER — DEXAMETHASONE SODIUM PHOSPHATE 10 MG/ML IJ SOLN
INTRAMUSCULAR | Status: AC
  Filled 2023-10-17: qty 1

## 2023-10-17 MED ORDER — ACETAMINOPHEN 10 MG/ML IV SOLN: 1000.0000 mg | Freq: Once | INTRAVENOUS | Status: DC | PRN

## 2023-10-17 MED ORDER — DEXAMETHASONE SODIUM PHOSPHATE 10 MG/ML IJ SOLN
INTRAMUSCULAR | Status: DC | PRN
  Administered 2023-10-17: 10 mg via INTRAVENOUS

## 2023-10-17 MED ORDER — STERILE WATER FOR IRRIGATION IR SOLN
Status: DC | PRN
  Administered 2023-10-17: 1000 mL

## 2023-10-17 MED ORDER — OXYCODONE HCL 5 MG PO TABS
5.0000 mg | ORAL_TABLET | ORAL | Status: DC | PRN
Start: 2023-10-17 — End: 2023-10-22
  Administered 2023-10-19 – 2023-10-20 (×6): 10 mg via ORAL
  Administered 2023-10-21 (×2): 5 mg via ORAL
  Filled 2023-10-17 (×4): qty 2
  Filled 2023-10-17: qty 1
  Filled 2023-10-17: qty 2
  Filled 2023-10-17: qty 1
  Filled 2023-10-17: qty 2

## 2023-10-17 MED ORDER — LIDOCAINE-EPINEPHRINE (PF) 2 %-1:200000 IJ SOLN: INTRAMUSCULAR | Status: DC | PRN

## 2023-10-17 MED ORDER — PHENYLEPHRINE 80 MCG/ML (10ML) SYRINGE FOR IV PUSH (FOR BLOOD PRESSURE SUPPORT)
PREFILLED_SYRINGE | INTRAVENOUS | Status: AC
  Filled 2023-10-17: qty 20

## 2023-10-17 MED ORDER — SODIUM CHLORIDE 0.9 % IV SOLN: INTRAVENOUS | Status: AC

## 2023-10-17 MED ORDER — EPINEPHRINE 1 MG/10ML IJ SOSY
PREFILLED_SYRINGE | INTRAMUSCULAR | Status: AC
  Administered 2023-10-17: 0.2 mg
  Filled 2023-10-17: qty 10

## 2023-10-17 MED ORDER — PHENYLEPHRINE HCL (PRESSORS) 10 MG/ML IV SOLN
INTRAVENOUS | Status: DC | PRN
  Administered 2023-10-17 (×4): 80 ug via INTRAVENOUS

## 2023-10-17 MED ORDER — EPINEPHRINE PF 1 MG/ML IJ SOLN: 0.3000 mg | INTRAMUSCULAR | Status: DC | PRN

## 2023-10-17 MED ORDER — HYDROMORPHONE HCL 2 MG PO TABS
2.0000 mg | ORAL_TABLET | ORAL | Status: DC | PRN
  Administered 2023-10-17: 2 mg via ORAL
  Filled 2023-10-17: qty 1

## 2023-10-17 MED ORDER — EPHEDRINE SULFATE-NACL 50-0.9 MG/10ML-% IV SOSY
PREFILLED_SYRINGE | INTRAVENOUS | Status: DC | PRN
  Administered 2023-10-17: 5 mg via INTRAVENOUS

## 2023-10-17 MED ORDER — FAMOTIDINE 20 MG PO TABS
40.0000 mg | ORAL_TABLET | Freq: Once | ORAL | Status: AC
  Administered 2023-10-17: 40 mg via ORAL
  Filled 2023-10-17: qty 2

## 2023-10-17 MED ORDER — KETOROLAC TROMETHAMINE 30 MG/ML IJ SOLN
INTRAMUSCULAR | Status: DC | PRN
  Administered 2023-10-17: 30 mg via INTRAVENOUS

## 2023-10-17 MED ORDER — ONDANSETRON HCL 4 MG/2ML IJ SOLN
INTRAMUSCULAR | Status: DC | PRN
  Administered 2023-10-17: 4 mg via INTRAVENOUS

## 2023-10-17 MED ORDER — LIDOCAINE-EPINEPHRINE (PF) 2 %-1:200000 IJ SOLN
INTRAMUSCULAR | Status: DC | PRN
  Administered 2023-10-17 (×4): 5 mL via EPIDURAL

## 2023-10-17 MED ORDER — PHENYLEPHRINE HCL-NACL 20-0.9 MG/250ML-% IV SOLN
INTRAVENOUS | Status: AC
  Filled 2023-10-17: qty 250

## 2023-10-17 MED ORDER — ACETAMINOPHEN 325 MG PO TABS: 325.0000 mg | ORAL_TABLET | ORAL | Status: DC | PRN

## 2023-10-17 MED ORDER — DROPERIDOL 2.5 MG/ML IJ SOLN: 0.6250 mg | Freq: Once | INTRAMUSCULAR | Status: DC | PRN

## 2023-10-17 MED ORDER — FENTANYL CITRATE (PF) 100 MCG/2ML IJ SOLN
INTRAMUSCULAR | Status: DC | PRN
  Administered 2023-10-17: 50 ug via INTRAVENOUS
  Administered 2023-10-17: 25 ug via INTRAVENOUS

## 2023-10-17 MED ORDER — DIPHENHYDRAMINE HCL 25 MG PO CAPS
25.0000 mg | ORAL_CAPSULE | Freq: Once | ORAL | Status: AC
  Administered 2023-10-17: 25 mg via ORAL

## 2023-10-17 MED ORDER — PREDNISONE 20 MG PO TABS
40.0000 mg | ORAL_TABLET | Freq: Once | ORAL | Status: DC
  Filled 2023-10-17: qty 2

## 2023-10-17 MED ORDER — FENTANYL CITRATE (PF) 100 MCG/2ML IJ SOLN
25.0000 ug | INTRAMUSCULAR | Status: DC | PRN
Start: 2023-10-17 — End: 2023-10-17

## 2023-10-17 MED ORDER — ACETAMINOPHEN 10 MG/ML IV SOLN
INTRAVENOUS | Status: DC | PRN
  Administered 2023-10-17: 1000 mg via INTRAVENOUS

## 2023-10-17 MED ORDER — SODIUM CHLORIDE 0.9 % IV SOLN
500.0000 mg | Freq: Once | INTRAVENOUS | Status: AC
  Administered 2023-10-17: 500 mg via INTRAVENOUS
  Filled 2023-10-17: qty 25

## 2023-10-17 MED ORDER — ACETAMINOPHEN 500 MG PO TABS
1000.0000 mg | ORAL_TABLET | Freq: Four times a day (QID) | ORAL | Status: DC | PRN
  Administered 2023-10-18 – 2023-10-19 (×3): 1000 mg via ORAL
  Filled 2023-10-17 (×3): qty 2

## 2023-10-17 MED ORDER — LISINOPRIL 10 MG PO TABS
10.0000 mg | ORAL_TABLET | Freq: Every day | ORAL | Status: DC
  Administered 2023-10-17: 10 mg via ORAL
  Filled 2023-10-17: qty 1

## 2023-10-17 MED ORDER — LIDOCAINE-EPINEPHRINE 1 %-1:100000 IJ SOLN
INTRAMUSCULAR | Status: DC | PRN
  Administered 2023-10-17: 20 mL via INTRADERMAL

## 2023-10-17 MED ORDER — ACETAMINOPHEN 160 MG/5ML PO SOLN: 325.0000 mg | ORAL | Status: DC | PRN

## 2023-10-17 MED ORDER — OXYCODONE HCL 5 MG PO TABS
5.0000 mg | ORAL_TABLET | ORAL | Status: DC | PRN
  Administered 2023-10-17: 10 mg via ORAL
  Filled 2023-10-17: qty 2

## 2023-10-17 SURGICAL SUPPLY — 27 items
CLOTH BEACON ORANGE TIMEOUT ST (SAFETY) ×1 IMPLANT
DERMABOND ADVANCED .7 DNX12 (GAUZE/BANDAGES/DRESSINGS) ×1 IMPLANT
DISSECTOR SURG LIGASURE 21 (MISCELLANEOUS) IMPLANT
DRSG OPSITE POSTOP 3X4 (GAUZE/BANDAGES/DRESSINGS) ×1 IMPLANT
DURAPREP 26ML APPLICATOR (WOUND CARE) ×1 IMPLANT
ELECT REM PT RETURN 9FT ADLT (ELECTROSURGICAL)
ELECTRODE REM PT RTRN 9FT ADLT (ELECTROSURGICAL) IMPLANT
GLOVE BIOGEL PI IND STRL 7.0 (GLOVE) ×2 IMPLANT
GLOVE ECLIPSE 6.5 STRL STRAW (GLOVE) ×1 IMPLANT
GOWN STRL REUS W/TWL LRG LVL3 (GOWN DISPOSABLE) ×2 IMPLANT
MAT PREVALON FULL STRYKER (MISCELLANEOUS) IMPLANT
NEEDLE HYPO 22GX1.5 SAFETY (NEEDLE) ×1 IMPLANT
NS IRRIG 1000ML POUR BTL (IV SOLUTION) ×1 IMPLANT
PACK ABDOMINAL MINOR (CUSTOM PROCEDURE TRAY) ×1 IMPLANT
PENCIL BUTTON HOLSTER BLD 10FT (ELECTRODE) ×1 IMPLANT
PROTECTOR NERVE ULNAR (MISCELLANEOUS) ×1 IMPLANT
RETRACTOR WOUND ALXS 19CM XSML (INSTRUMENTS) IMPLANT
RTRCTR WOUND ALEXIS 19CM XSML (INSTRUMENTS) ×1
SPONGE LAP 4X18 RFD (DISPOSABLE) IMPLANT
SUT MON AB 4-0 PS1 27 (SUTURE) ×1 IMPLANT
SUT PLAIN 1 NONE 54 (SUTURE) ×1 IMPLANT
SUT VIC AB 0 CT1 27 (SUTURE) ×1
SUT VIC AB 0 CT1 27XBRD ANBCTR (SUTURE) ×1 IMPLANT
SUT VIC AB CT1 27XBRD ANBCTRL (SUTURE) ×1
SYR CONTROL 10ML LL (SYRINGE) ×1 IMPLANT
TOWEL OR 17X24 6PK STRL BLUE (TOWEL DISPOSABLE) ×2 IMPLANT
TRAY FOLEY CATH SILVER 14FR (SET/KITS/TRAYS/PACK) ×1 IMPLANT

## 2023-10-17 NOTE — Progress Notes (Signed)
Chaplain responded to Medical Alert Rapid Response for pt who had a reaction to an IV iron infusion.  Nurse reported pt recovering at this time. Medical staff still at bedside.  Baby's father kneeling by mother on other side comforting her. Mother's eyes closed.  Chaplain visited with pt's mother who was holding the newborn baby, offering ministry of presence as she described  how scared she had been when her daughter "crashed".   Chaplain departed when baby became fretful and grandmother tried to soothe her. Chaplain did not speak with pt whose eyes remained closed the entire time.  Vernell Morgans Chaplain

## 2023-10-17 NOTE — Progress Notes (Addendum)
Post Partum Day 1 Subjective: no complaints, up ad lib, voiding, and tolerating PO.  Objective: Blood pressure (!) 147/95, pulse 87, temperature 98.6 F (37 C), temperature source Oral, resp. rate 18, height 5\' 3"  (1.6 m), weight 111.5 kg, last menstrual period 01/23/2023, SpO2 99%, unknown if currently breastfeeding.    10/17/2023    8:37 AM 10/17/2023    5:00 AM 10/17/2023    1:00 AM  Vitals with BMI  Systolic 147 134 425  Diastolic 95 89 83  Pulse 87 84 89   Physical Exam:  General: alert, cooperative, and no distress Lochia: appropriate Uterine Fundus: firm Incision: N/A DVT Evaluation: No evidence of DVT seen on physical exam. Calf/Ankle edema is present.  Recent Labs    10/16/23 0621 10/17/23 0447  HGB 9.6* 8.0*  HCT 30.9* 25.5*    Assessment/Plan:  32 y/o [id number] PPD # 1, with chronic HTN, Will start antihypertensives. Will give IV iron for anemia as patient with Hgb of 8.0 and has had a difficult time tolerating oral iron tabs before. Discussed risks, benefits and alternatives of IV iron including risks of allergic reactions.  Patient desires to proceed with post partum bilateral tubal ligation via complete removal of both fallopian tubes.   We discussed risks, benefits and alternatives of postpartum tubal ligation including but not limited to risks of bleeding, infection, damage to organs.  She also understood the risk of tubal regret but she states she is 100% sure she did not want any more children.  We discussed that partial removal of both tubes carried a small risk of failure of less than 1% with an accompanying higher risk of ectopic pregnancy in case of failure.  She desires complete removal of both fallopian tubes.  She understood there were other kinds of birth control such as pills, patches, IUDs, vaginal rings and depo provera which were temporary but she did not desire them.  She understood there was also an option of female sterilization but she did not  desire that option either.  She did not desire tubal fulguration or placement of tubal clips.  All her questions were answered and she was consented for the procedure.   Plan for discharge tomorrow, Breastfeeding, and Contraception for post partum tubal ligation.   LOS: 1 day   Prescilla Sours, MD 10/17/2023, 8:46 AM

## 2023-10-17 NOTE — Anesthesia Postprocedure Evaluation (Signed)
Anesthesia Post Note  Patient: Theresa Horne  Procedure(s) Performed: POST PARTUM TUBAL LIGATION     Patient location during evaluation: Mother Baby Anesthesia Type: Epidural Level of consciousness: awake and alert Pain management: pain level controlled Vital Signs Assessment: post-procedure vital signs reviewed and stable Respiratory status: spontaneous breathing, nonlabored ventilation and respiratory function stable Cardiovascular status: stable Postop Assessment: no headache, no backache and epidural receding Anesthetic complications: no  No notable events documented.  Last Vitals:  Vitals:   10/17/23 1219 10/17/23 1428  BP: (!) 131/93 (!) 147/90  Pulse: 87 (!) 102  Resp: 18 18  Temp: 36.5 C 36.8 C  SpO2: 97% 100%    Last Pain:  Vitals:   10/17/23 1428  TempSrc: Oral  PainSc:    Pain Goal: Patients Stated Pain Goal: 5 (10/17/23 1200)                 Shelton Silvas

## 2023-10-17 NOTE — Transfer of Care (Signed)
Immediate Anesthesia Transfer of Care Note  Patient: Theresa Horne  Procedure(s) Performed: POST PARTUM TUBAL LIGATION  Patient Location: PACU  Anesthesia Type:Epidural  Level of Consciousness: awake, alert , and oriented  Airway & Oxygen Therapy: Patient Spontanous Breathing  Post-op Assessment: Report given to RN and Post -op Vital signs reviewed and stable  Post vital signs: Reviewed and stable  Last Vitals:  Vitals Value Taken Time  BP 117/77 10/17/23 1110  Temp    Pulse 85 10/17/23 1114  Resp 20 10/17/23 1114  SpO2 97 % 10/17/23 1114  Vitals shown include unfiled device data.  Last Pain:  Vitals:   10/17/23 0837  TempSrc:   PainSc: 5          Complications: No notable events documented.

## 2023-10-17 NOTE — Anesthesia Preprocedure Evaluation (Addendum)
Anesthesia Evaluation    Reviewed: Allergy & Precautions, NPO status , Patient's Chart, lab work & pertinent test results  Airway Mallampati: II  TM Distance: >3 FB Neck ROM: Full    Dental  (+) Teeth Intact, Dental Advisory Given   Pulmonary former smoker   breath sounds clear to auscultation       Cardiovascular hypertension,  Rhythm:Regular Rate:Normal     Neuro/Psych    GI/Hepatic   Endo/Other    Class 3 obesity  Renal/GU      Musculoskeletal   Abdominal   Peds  Hematology   Anesthesia Other Findings   Reproductive/Obstetrics                             Anesthesia Physical Anesthesia Plan  ASA: 3  Anesthesia Plan: Epidural   Post-op Pain Management: Ofirmev IV (intra-op)*   Induction: Intravenous  PONV Risk Score and Plan: 3 and Ondansetron and Treatment may vary due to age or medical condition  Airway Management Planned: Natural Airway  Additional Equipment: None  Intra-op Plan:   Post-operative Plan:   Informed Consent:   Plan Discussed with: CRNA  Anesthesia Plan Comments: (Lab Results      Component                Value               Date                      WBC                      8.5                 10/17/2023                HGB                      8.0 (L)             10/17/2023                HCT                      25.5 (L)            10/17/2023                MCV                      81.5                10/17/2023                PLT                      137 (L)             10/17/2023           )       Anesthesia Quick Evaluation

## 2023-10-17 NOTE — Progress Notes (Signed)
Post Partum Day 1 Was called to evaluate patient for severe allergic reaction. As per RN patient complained of chest pain and shortness of breath and became unresponsive for a few seconds. She called me at that point and I instructed for patient to be given epinephrine IM.  Upon arrival to the unit I found patient awake and responsive. She reported that her shortness of breath and chest pain had resolved.   Objective: Blood pressure 133/85, pulse 87, temperature 98.4 F (36.9 C), temperature source Oral, resp. rate (!) 22, height 5\' 3"  (1.6 m), weight 111.5 kg, last menstrual period 01/23/2023, SpO2 100%, unknown if currently breastfeeding.     10/17/2023    8:31 PM 10/17/2023    8:29 PM 10/17/2023    8:26 PM  Vitals with BMI  Systolic 133 131 528  Diastolic 85 90 98  Pulse 87 87 89     Physical Exam:  General: alert, cooperative, fatigued, and no distress CVS: s1, S2, Regular rate and rhythm Pulmonary: Clear to auscultation bilaterally Lochia: appropriate.  Uterine Fundus: firm Incision: With slight serosangenous drainage DVT Evaluation: No evidence of DVT seen on physical exam. Calf/Ankle edema is present.  Erythematous rash and hives on bilateral legs and arms.   Recent Labs    10/16/23 0621 10/17/23 0447  HGB 9.6* 8.0*  HCT 30.9* 25.5*    Assessment/Plan:  32 y/o [id number] PPD # 1 and POD # 0 after postpartum tubal ligation, with severe allergic reaction, likely to IV iron, S/p oral benadryl, oral pepcid, IM epinephrine,stable  - Will transfer patient to [facility] specialty unit for closer monitoring.  - Continue IV fluids. - Discussed with family and patient recent events and plan of care and they expressed understanding.     LOS: 1 day   Prescilla Sours, MD 10/17/2023, 8:41 PM

## 2023-10-17 NOTE — Anesthesia Postprocedure Evaluation (Signed)
Anesthesia Post Note  Patient: Theresa Horne  Procedure(s) Performed: AN AD HOC LABOR EPIDURAL     Patient location during evaluation: Mother Baby Anesthesia Type: Epidural Level of consciousness: awake, oriented and awake and alert Pain management: pain level controlled Vital Signs Assessment: post-procedure vital signs reviewed and stable Respiratory status: spontaneous breathing, respiratory function stable and nonlabored ventilation Cardiovascular status: stable Postop Assessment: no headache, adequate PO intake, able to ambulate, patient able to bend at knees and no apparent nausea or vomiting Anesthetic complications: no   No notable events documented.  Last Vitals:  Vitals:   10/17/23 1200 10/17/23 1219  BP: (!) 135/95 (!) 131/93  Pulse: 87 87  Resp: 20 18  Temp:  36.5 C  SpO2: 97% 97%    Last Pain:  Vitals:   10/17/23 1219  TempSrc: Axillary  PainSc:    Pain Goal: Patients Stated Pain Goal: 5 (10/17/23 1200)  LLE Motor Response: Non-purposeful movement (10/17/23 1200) LLE Sensation: Tingling (10/17/23 1200) RLE Motor Response: Non-purposeful movement (10/17/23 1200) RLE Sensation: Tingling (10/17/23 1200) L Sensory Level: L5-Outer lower leg, top of foot, great toe (10/17/23 1200) R Sensory Level: L5-Outer lower leg, top of foot, great toe (10/17/23 1200)    Milen Lengacher

## 2023-10-17 NOTE — Progress Notes (Signed)
Patient returned to bed and noticed increase swelling in her legs and feet. Stated it hurt to walk on because they were tight, also noticed her legs had broken out in hives. MD notified. Benadryl given. About 10 minutes later this nurse was called back to room and patient stated her hands were now swollen as well and her IV had infiltrated. She stated it hurt to bend her hands, wrists, knees and feet. Said it felt stiff and painful. Iron infusion stopped and IV removed. MD Notified

## 2023-10-17 NOTE — Progress Notes (Addendum)
Post Partum Day 1 and POD # 0 Subjective: Called to evaluate patient for rash and hives that began as IV iron infusion was finishing. She previously received lisinopril at 1 pm. Patient reports she feels "swollen", joint pain (knees, wrists, ankles) and stiffness in hands. She feels her swelling in legs and hands have increased lately. She denies chest pain, shortness of breath or nausea or vomiting.   Objective: Blood pressure (!) 149/92, pulse 83, temperature 98.4 F (36.9 C), temperature source Oral, resp. rate 18, height 5\' 3"  (1.6 m), weight 111.5 kg, last menstrual period 01/23/2023, SpO2 99%, unknown if currently breastfeeding.    10/17/2023    6:06 PM 10/17/2023    2:28 PM 10/17/2023   12:19 PM  Vitals with BMI  Systolic 149 147 027  Diastolic 92 90 93  Pulse 83 102 87    Physical Exam:  General: alert, cooperative, fatigued, and no distress CVS: s1, S2, Regular rate and rhythm Pulmonary: Clear to auscultation bilaterally Lochia: appropriate.  Uterine Fundus: firm Incision: With slight serosangenous drainage DVT Evaluation: No evidence of DVT seen on physical exam. Calf/Ankle edema is present.  Erythematous rash and hives on bilateral legs and arms.   Recent Labs    10/16/23 0621 10/17/23 0447  HGB 9.6* 8.0*  HCT 30.9* 25.5*    Assessment/Plan: 31 y/o [id number] PPD # 1 and POD # 0 after postpartum tubal ligation, with allergic reaction, likely to IV iron versus lisinopril, - Will give benadryl 50 mg PO, Pepcid 40 mg PO and prednisone 40 mg PO. - Lisinopril stopped.  Patient unable to to tolerate procardia or labetalol and declines hydrochlorothiazide.  Will consider amlodipine for blood pressure control. - Oral Dilaudid for pain medication as needed.  - Patient  is concerned about swelling in her extremities, consider lasix for later.    LOS: 1 day   Prescilla Sours, MD 10/17/2023, 7:15 PM

## 2023-10-17 NOTE — Plan of Care (Signed)
  Problem: Education: Goal: Knowledge of General Education information will improve Description: Including pain rating scale, medication(s)/side effects and non-pharmacologic comfort measures Outcome: Progressing   Problem: Health Behavior/Discharge Planning: Goal: Ability to manage health-related needs will improve Outcome: Progressing   Problem: Clinical Measurements: Goal: Ability to maintain clinical measurements within normal limits will improve Outcome: Progressing Goal: Will remain free from infection Outcome: Progressing Goal: Diagnostic test results will improve Outcome: Progressing Goal: Respiratory complications will improve Outcome: Progressing Goal: Cardiovascular complication will be avoided Outcome: Progressing   Problem: Activity: Goal: Risk for activity intolerance will decrease Outcome: Progressing   Problem: Nutrition: Goal: Adequate nutrition will be maintained Outcome: Progressing   Problem: Coping: Goal: Level of anxiety will decrease Outcome: Progressing   Problem: Elimination: Goal: Will not experience complications related to bowel motility Outcome: Progressing Goal: Will not experience complications related to urinary retention Outcome: Progressing   Problem: Pain Management: Goal: General experience of comfort will improve Outcome: Progressing   Problem: Safety: Goal: Ability to remain free from injury will improve Outcome: Progressing   Problem: Skin Integrity: Goal: Risk for impaired skin integrity will decrease Outcome: Progressing   Problem: Education: Goal: Knowledge of Childbirth will improve Outcome: Progressing Goal: Ability to make informed decisions regarding treatment and plan of care will improve Outcome: Progressing Goal: Ability to state and carry out methods to decrease the pain will improve Outcome: Progressing Goal: Individualized Educational Video(s) Outcome: Progressing   Problem: Coping: Goal: Ability to  verbalize concerns and feelings about labor and delivery will improve Outcome: Progressing   Problem: Life Cycle: Goal: Ability to make normal progression through stages of labor will improve Outcome: Progressing Goal: Ability to effectively push during vaginal delivery will improve Outcome: Progressing   Problem: Role Relationship: Goal: Will demonstrate positive interactions with the child Outcome: Progressing   Problem: Safety: Goal: Risk of complications during labor and delivery will decrease Outcome: Progressing   Problem: Pain Management: Goal: Relief or control of pain from uterine contractions will improve Outcome: Progressing   Problem: Education: Goal: Knowledge of condition will improve Outcome: Progressing Goal: Individualized Educational Video(s) Outcome: Progressing Goal: Individualized Newborn Educational Video(s) Outcome: Progressing   Problem: Activity: Goal: Will verbalize the importance of balancing activity with adequate rest periods Outcome: Progressing Goal: Ability to tolerate increased activity will improve Outcome: Progressing   Problem: Coping: Goal: Ability to identify and utilize available resources and services will improve Outcome: Progressing   Problem: Life Cycle: Goal: Chance of risk for complications during the postpartum period will decrease Outcome: Progressing   Problem: Role Relationship: Goal: Ability to demonstrate positive interaction with newborn will improve Outcome: Progressing   Problem: Skin Integrity: Goal: Demonstration of wound healing without infection will improve Outcome: Progressing

## 2023-10-17 NOTE — Progress Notes (Signed)
Patient up to bathroom with stedy and Nurse Tech x2.  Cardiac Monitoring alterted HR 140-150 during ambulation. On toilet, patient complained of "feel like I'm going to pass out". Duress called and nursing staff to bedside. Patient states "I need to get to bed" and stood up towards bed from toilet. Patient fell to bed, laying flat. Blood Pressure 81/51 HR 81 O2 99 room air while laying flat on bed. Patient moved with [facility] elevation by nursing staff x 4. Semi fowlers BP 100/72 HR 105 O2 99 room air. Blood pressure q15 min initiated. RN x2, NT x2 at bedside. Dr. Sallye Ober called to discuss plan of care with recent episode. See provider notification for new orders. Raelyn Ensign RN

## 2023-10-17 NOTE — Progress Notes (Signed)
During shift change report, it was reported by the day shift nurse that this patient had a reaction to IV venofer, which began as hives. According to the [facility] the venofer was stopped at 1854. Benadryl was administered at 1855. MD notified by the day shift RN about this reaction. Prednisone was also ordered. When the night shift RN entered the patient's room to give prednisone the patient yelled out "I can't breathe, and I feel like I'm dying." Prednisone held at that time. The neonatal code button was pulled rather than the adult code blue button. Patient was not responding to commands after yelling out. Vitals were taken, the initial BP was 84/54 and 5 minutes later 104/74. heart rate 96. O2 remained at 100%. Patient was breathing the whole time. Still, a non-rebreather mask was placed and O2 turned up to 15 Liters per CRNA verbal order. Charge nurse was called at 103. Emergency number/PBIX operator was instructed to be called at that time. MD (Dr. Sallye Ober) notified at 2000. Rapid response nurse was also present at the bedside after being called around 2000. A new IV was placed at this time due to the prior one being infiltrated. Vital signs continued to remain stable throughout. Epinephrine was ordered verbally over the phone by Dr. Sallye Ober 0.2 mg IM. Pharmacy also verbally ordered epinephrine to be given 0.3-0.5 mg every 15 minutes as needed. A total of 1 ml was given IM to patient at 2012 on mother/baby. Patient stable and was alert and oriented x4 before transfer. At 2100 she was transferred to [facility] for closer monitoring and to be placed on telemetry.

## 2023-10-17 NOTE — Op Note (Signed)
Patient: Theresa Horne DOB: 1990-12-04 MRN: [id number]  PREOP DIAGNOSIS:  1.Multiparous (Para 4)  patient desiring permanent sterilization. 2. Maternal BMI of 43.   POSTOP DIAGNOSIS: Same as above.  PROCEDURE: Post partum bilateral tubal salpingectomy.  SURGEON: Dr. Hoover Browns.  ANESTHESIA: Spinal  COMPLICATIONS: None.  EBL: 3 mL.  IV FLUID: 400 mL LR.  URINE OUTPUT: 500 mL in foley placed after surgery.   FINDINGS: Normal uterus. Normal left and right fallopian tubes bilaterally.  Normal left and right ovary.    PROCEDURE:   Informed consent was obtained from the patient to undergo the procedure after discussing the risks benefits and alternatives of the procedure.  She was taken to the operating room where anesthesia was administered without difficulty.  She was prepped abdominally with chloraprep in the usual sterile fashion.    1% lidocaine was instilled in the infraumbilical area. The skin was incised with a scalpel about 2 cm, and and entry into the abdomen was made through the subcutaneous layer, fascia and peritoneal layers.  The patient was tilted to her left side and attempt was made to locate the fallopian tube with difficulty.  Bowel was packed away and small alexis retractor was placed in for better exposure.  The right fallopian tube was identified and followed up to the fimbria end through the incision. The right fallopian tube mesosalpinx was then ligated and cut with the small Ligasure impact leaving a 1 cm cornual end of tube. The patient was then tilted to her right side and the left fallopian tube identified, followed out to the fimbria end and similarly ligated and transected with the ligasure impact.  Excellent hemostasis was noted on the remaining pedicles.  The fascia was then closed using 0 Vicryl in a running stitch.  The skin was closed using 4-0 Monocryl subcuticular stitch. More 1% lidocaine was instilled over the incision, total used 20cc.  Honey comb  dressing was then placed on. The instrument and needle counts were correct. A foley catheter was placed in her bladder at the enod of the procedure.  She was then taken to the recovery room in a stable condition.  SPECIMEN:  Left and right fallopian tubes to pathology.    Dr. Hoover Browns.  10/17/2023.

## 2023-10-18 DIAGNOSIS — I10 Essential (primary) hypertension: Secondary | ICD-10-CM | POA: Diagnosis not present

## 2023-10-18 DIAGNOSIS — I493 Ventricular premature depolarization: Secondary | ICD-10-CM

## 2023-10-18 LAB — CBC
HCT: 40.6 % (ref 36.0–46.0)
Hemoglobin: 13 g/dL (ref 12.0–15.0)
MCH: 26.2 pg (ref 26.0–34.0)
MCHC: 32 g/dL (ref 30.0–36.0)
MCV: 81.9 fL (ref 80.0–100.0)
Platelets: 213 10*3/uL (ref 150–400)
RBC: 4.96 MIL/uL (ref 3.87–5.11)
RDW: 14.7 % (ref 11.5–15.5)
WBC: 16.7 10*3/uL — ABNORMAL HIGH (ref 4.0–10.5)
nRBC: 0 % (ref 0.0–0.2)

## 2023-10-18 NOTE — Consult Note (Signed)
Cardiology Consultation   Patient ID: MARAN SATO MRN: [id number]; DOB: January 31, 1991  Admit date: 10/16/2023 Date of Consult: 10/18/2023  PCP:  Patient, No Pcp Per   [facility] Providers Cardiologist:  Thomasene Ripple, DO        Patient Profile:   Theresa Horne is a 32 y.o. female with a hx of preterm labor and PACs who is being seen 10/18/2023 for the evaluation of allergic reaction to Venofer at the request of Dr. Sallye Ober.  History of Present Illness:   Theresa Horne is a 32 year old female who is postpartum day 2 on whom we are consulted for PVCs.  She had an allergic reaction to IV Venofer yesterday, with some chest pain and shortness of breath.  She does have some peripheral edema possibly expected volume shifts after delivery.  She recovered quickly after reaction to IV Venofer, and feels well today.  She is calm and asymptomatic in the room with her baby who is also doing well.  Patient has seen me previously for PACs at which time we had considered using metoprolol.  Echocardiogram normal at that time.  Notes document and intolerance to labetalol and Procardia.  She did have some predelivery anemia, strangely on repeat this has normalized x 2 immediately after receiving Venofer.  Consider confirmatory CBC in the morning as well.   Past Medical History:  Diagnosis Date   Hypertension    Preterm labor    STD (sexually transmitted disease)    Chlamydia    Past Surgical History:  Procedure Laterality Date   INDUCED ABORTION     TUBAL LIGATION N/A 10/17/2023   Procedure: POST PARTUM TUBAL LIGATION;  Surgeon: Hoover Browns, MD;  Location: [facility] LD ORS;  Service: Gynecology;  Laterality: N/A;     Home Medications:  Prior to Admission medications   Medication Sig Start Date End Date Taking? Authorizing Provider  calcium carbonate (TUMS EX) 750 MG chewable tablet Chew 1 tablet by mouth daily.   Yes [provider]  Hydrocortisone, Perianal,  (PREPARATION H SOOTHING RELIEF) 1 % CREA Use three times a day as needed for hemorrhoid discomfort relief 10/04/23  Yes Sundra Aland, MD  NIFEdipine (ADALAT CC) 30 MG 24 hr tablet Take 1 tablet (30 mg total) by mouth daily. 09/26/23  Yes Hessie Dibble, MD  witch hazel-glycerin (TUCKS) pad Apply topically as needed for itching. 10/04/23  Yes Sundra Aland, MD  metoprolol succinate (TOPROL-XL) 25 MG 24 hr tablet Take 1 tablet (25 mg total) by mouth daily. 01/04/21 02/06/21  Parke Poisson, MD    Inpatient Medications: Scheduled Meds:  ibuprofen  600 mg Oral Q6H   predniSONE  40 mg Oral Once   prenatal multivitamin  1 tablet Oral Q1200   senna-docusate  2 tablet Oral Daily   Tdap  0.5 mL Intramuscular Once   Continuous Infusions:  sodium chloride 125 mL/hr at 10/18/23 1120   PRN Meds: acetaminophen, benzocaine-Menthol, coconut oil, witch hazel-glycerin **AND** dibucaine, diphenhydrAMINE, EPINEPHrine, HYDROmorphone, ondansetron **OR** ondansetron (ZOFRAN) IV, oxyCODONE, simethicone, zolpidem  Allergies:    Allergies  Allergen Reactions   Venofer [Iron Sucrose] Anaphylaxis, Hives, Swelling and Rash    Social History:   Social History   Socioeconomic History   Marital status: Single    Spouse name: Not on file   Number of children: Not on file   Years of education: Not on file   Highest education level: Not on file  Occupational History   Not on file  Tobacco Use   Smoking status: Former    Current packs/day: 0.00    Types: Cigarettes    Quit date: 01/26/2015    Years since quitting: 8.7   Smokeless tobacco: Never  Vaping Use   Vaping status: Never Used  Substance and Sexual Activity   Alcohol use: Not Currently   Drug use: Not Currently    Types: Marijuana   Sexual activity: Yes    Comment: pt currently pregnant  Other Topics Concern   Not on file  Social History Narrative   Not on file   Social Determinants of Health   Financial Resource Strain: Low Risk   (04/24/2019)   Overall Financial Resource Strain (CARDIA)    Difficulty of Paying Living Expenses: Not hard at all  Food Insecurity: No Food Insecurity (10/16/2023)   Hunger Vital Sign    Worried About Running Out of Food in the Last Year: Never true    Ran Out of Food in the Last Year: Never true  Transportation Needs: No Transportation Needs (10/16/2023)   PRAPARE - Administrator, Civil Service (Medical): No    Lack of Transportation (Non-Medical): No  Physical Activity: Not on file  Stress: No Stress Concern Present (04/24/2019)   [employer] of Occupational Health - Occupational Stress Questionnaire    Feeling of Stress : Not at all  Social Connections: Not on file  Intimate Partner Violence: Not At Risk (10/16/2023)   Humiliation, Afraid, Rape, and Kick questionnaire    Fear of Current or Ex-Partner: No    Emotionally Abused: No    Physically Abused: No    Sexually Abused: No    Family History:    Family History  Problem Relation Age of Onset   Hypertension Mother    Diabetes Father    Hypertension Father    Diabetes Paternal Uncle    Renal Disease Maternal Grandmother    Diabetes Paternal Grandfather      ROS:  Please see the history of present illness.   All other ROS reviewed and negative.     Physical Exam/Data:   Vitals:   10/18/23 1501 10/18/23 1531 10/18/23 1601 10/18/23 1631  BP: 127/75 125/75 124/74 124/64  Pulse: 92 88 91 93  Resp:      Temp:      TempSrc:      SpO2:      Weight:      Height:        Intake/Output Summary (Last 24 hours) at 10/18/2023 1655 Last data filed at 10/18/2023 1043 Gross per 24 hour  Intake --  Output 150 ml  Net -150 ml      10/15/2023    7:00 AM 10/09/2023   10:31 AM 10/05/2023    4:23 PM  Last 3 Weights  Weight (lbs) 245 lb 14.4 oz 251 lb 6.4 oz 253 lb 4.8 oz  Weight (kg) 111.54 kg 114.034 kg 114.896 kg     Body mass index is 43.56 kg/m.  General:  Well nourished, well developed, in no  acute distress HEENT: normal Neck: no JVD Vascular: No carotid bruits; Distal pulses 2+ bilaterally Cardiac:  normal S1, S2; RRR; 2/6 murmur  Lungs:  clear to auscultation bilaterally, no wheezing, rhonchi or rales  Abd: soft, nontender, no hepatomegaly  Ext: 1+ edema Musculoskeletal:  No deformities, BUE and BLE strength normal and equal Skin: warm and dry  Neuro:  CNs 2-12 intact, no focal abnormalities noted Psych:  Normal affect   EKG:  The EKG was personally reviewed and demonstrates:  n/a Telemetry:  Telemetry was personally reviewed and demonstrates:  SR PVCs  Relevant CV Studies: Echo pending  Laboratory Data:  High Sensitivity Troponin:  No results for input(s): "TROPONINIHS" in the last 720 hours.   Chemistry Recent Labs  Lab 10/16/23 0621 10/17/23 2244  NA 134* 133*  K 3.7 4.3  CL 105 109  CO2 18* 15*  GLUCOSE 82 141*  BUN 10 8  CREATININE 0.85 0.97  CALCIUM 9.7 8.1*  GFRNONAA >60 >60  ANIONGAP 11 9    Recent Labs  Lab 10/16/23 0621 10/17/23 2244  PROT 6.8 5.6*  ALBUMIN 2.9* 2.2*  AST 22 21  ALT 26 20  ALKPHOS 91 69  BILITOT 0.5 0.5   Lipids No results for input(s): "CHOL", "TRIG", "HDL", "LABVLDL", "LDLCALC", "CHOLHDL" in the last 168 hours.  Hematology Recent Labs  Lab 10/17/23 0447 10/17/23 2244 10/17/23 2338  WBC 8.5 16.5* 16.7*  RBC 3.13* 5.07 4.96  HGB 8.0* 13.1 13.0  HCT 25.5* 41.1 40.6  MCV 81.5 81.1 81.9  MCH 25.6* 25.8* 26.2  MCHC 31.4 31.9 32.0  RDW 14.6 14.7 14.7  PLT 137* 217 213   Thyroid No results for input(s): "TSH", "FREET4" in the last 168 hours.  BNPNo results for input(s): "BNP", "PROBNP" in the last 168 hours.  DDimer No results for input(s): "DDIMER" in the last 168 hours.   Radiology/Studies:  No results found.   Assessment and Plan:   Principal Problem:   Hypertensive disorder Active Problems:   Pregnancy  PVCs: -Will obtain an echo given frequency of PVCs to ensure LV function is normal with an  episode of chest pain and shortness of breath during allergic reaction to Venofer yesterday.  Prior echo normal and personally interpreted. -Can consider as needed metoprolol for palpitations home-going  Chest pain Shortness of breath -Will double check cardiovascular function with echocardiogram prior to dismissal.  Patient in agreement with this plan and given asymptomatic status is comfortable with conservative approach.   Risk Assessment/Risk Scores:        For questions or updates, please contact [facility] Please consult [email or web address] for contact info under    Signed, Parke Poisson, MD  10/18/2023 4:55 PM

## 2023-10-18 NOTE — Progress Notes (Signed)
Post Partum Day 2  Subjective: voiding, tolerating PO, + flatus, and reports frequent, as in hourly, palpitations which she had prenatally and saw Dr. Burman Freestone.  Pt said she did not have a w/u at that time and the appt was just prior to admission for delivery.  She denies any other symptoms and states she feels better since anaphylactic reaction last night, possibly to IV venofer but pt said after the benadryl she felt even worse and felt like she might "die. "  Denies SOB light headedness and dizziness.  Objective: Blood pressure 116/73, pulse 96, temperature 97.8 F (36.6 C), temperature source Oral, resp. rate 17, height 5\' 3"  (1.6 m), weight 111.5 kg, last menstrual period 01/23/2023, SpO2 100%, unknown if currently breastfeeding.  Physical Exam:  General: alert, cooperative, and no distress Lochia: appropriate Uterine Fundus: FF, NT Incision: n/a DVT Evaluation: no calf tenderness, SCDs are on  Recent Labs    10/17/23 2244 10/17/23 2338  HGB 13.1 13.0  HCT 41.1 40.6    Assessment/Plan: Doing ok s/p suspected anaphylactic reaction to possibly IV venofer last night with PACs, PVCs and report of bigeminy on the telemetry monitoring.  Pt is c/o frequent palpitations and cardiology was consulted during the night via epic and I spoke with Dr. Clifton James who said someone will come by to see her.  Pt has an intolerance to labetalol and procardia d/t SE of HAs. H/o CHTN - S/p lisinopril yesterday and BPs are ok today.  Will cont to observe.  Pt declining BP medication anyway d/t SEs. Breastfeeding Encouraged OOB ad lib Repeat CBC and CMP in the morning If cleared by cardiology, anticipate discharge tomorrow Pt taking ibuprofen and tylenol for pain and wants to avoid narcotics   LOS: 2 days   Purcell Nails, MD 10/18/2023, 2:40 PM

## 2023-10-19 ENCOUNTER — Encounter (HOSPITAL_COMMUNITY): Payer: Self-pay

## 2023-10-19 ENCOUNTER — Inpatient Hospital Stay (HOSPITAL_COMMUNITY): Admit: 2023-10-19 | Payer: BC Managed Care – PPO | Admitting: Obstetrics & Gynecology

## 2023-10-19 ENCOUNTER — Encounter (HOSPITAL_COMMUNITY): Payer: BC Managed Care – PPO

## 2023-10-19 ENCOUNTER — Inpatient Hospital Stay (HOSPITAL_COMMUNITY): Payer: BC Managed Care – PPO

## 2023-10-19 DIAGNOSIS — R9431 Abnormal electrocardiogram [ECG] [EKG]: Secondary | ICD-10-CM | POA: Diagnosis not present

## 2023-10-19 DIAGNOSIS — I10 Essential (primary) hypertension: Secondary | ICD-10-CM | POA: Diagnosis not present

## 2023-10-19 LAB — CBC
HCT: 24.4 % — ABNORMAL LOW (ref 36.0–46.0)
Hemoglobin: 7.7 g/dL — ABNORMAL LOW (ref 12.0–15.0)
MCH: 26 pg (ref 26.0–34.0)
MCHC: 31.6 g/dL (ref 30.0–36.0)
MCV: 82.4 fL (ref 80.0–100.0)
Platelets: 151 10*3/uL (ref 150–400)
RBC: 2.96 MIL/uL — ABNORMAL LOW (ref 3.87–5.11)
RDW: 15 % (ref 11.5–15.5)
WBC: 7.5 10*3/uL (ref 4.0–10.5)
nRBC: 0 % (ref 0.0–0.2)

## 2023-10-19 LAB — COMPREHENSIVE METABOLIC PANEL
ALT: 22 U/L (ref 0–44)
AST: 25 U/L (ref 15–41)
Albumin: 2.4 g/dL — ABNORMAL LOW (ref 3.5–5.0)
Alkaline Phosphatase: 53 U/L (ref 38–126)
Anion gap: 9 (ref 5–15)
BUN: 9 mg/dL (ref 6–20)
CO2: 19 mmol/L — ABNORMAL LOW (ref 22–32)
Calcium: 8.4 mg/dL — ABNORMAL LOW (ref 8.9–10.3)
Chloride: 109 mmol/L (ref 98–111)
Creatinine, Ser: 1.18 mg/dL — ABNORMAL HIGH (ref 0.44–1.00)
GFR, Estimated: >60 mL/min (ref >60)
Glucose, Bld: 92 mg/dL (ref 70–99)
Potassium: 3.6 mmol/L (ref 3.5–5.1)
Sodium: 137 mmol/L (ref 135–145)
Total Bilirubin: 0.4 mg/dL (ref <1.2)
Total Protein: 5.3 g/dL — ABNORMAL LOW (ref 6.5–8.1)

## 2023-10-19 LAB — ECHOCARDIOGRAM COMPLETE
Area-P 1/2: 5.23 cm2
Calc EF: 60.7 %
Est EF: 55
S' Lateral: 3.5 cm
Single Plane A2C EF: 62.7 %
Single Plane A4C EF: 55.8 %

## 2023-10-19 LAB — SURGICAL PATHOLOGY

## 2023-10-19 SURGERY — Surgical Case
Anesthesia: Regional

## 2023-10-19 MED ORDER — HYDROCORTISONE (PERIANAL) 2.5 % EX CREA
TOPICAL_CREAM | Freq: Two times a day (BID) | CUTANEOUS | Status: DC | PRN
  Filled 2023-10-19: qty 28.35

## 2023-10-19 MED ORDER — LACTATED RINGERS IV SOLN: INTRAVENOUS | Status: AC

## 2023-10-19 MED ORDER — MAGNESIUM SULFATE 40 GM/1000ML IV SOLN
1.0000 g/h | INTRAVENOUS | Status: AC
  Administered 2023-10-19: 1 g/h via INTRAVENOUS
  Filled 2023-10-19: qty 1000

## 2023-10-19 MED ORDER — MAGNESIUM SULFATE BOLUS VIA INFUSION
4.0000 g | Freq: Once | INTRAVENOUS | Status: AC
  Administered 2023-10-19: 4 g via INTRAVENOUS
  Filled 2023-10-19: qty 1000

## 2023-10-19 NOTE — Lactation Note (Signed)
This note was copied from a baby's chart. Lactation Consultation Note  Patient Name: Theresa Horne Today's Date: 10/19/2023 Age:32 hours   P4- RN requested for LC to bring size 27 mm and size 30 mm flanges to [name], because [name] believes that the 24 mm flanges are too small. [name] still does not want to see lactation and does not want flange fitting. LC provided these two flanges sizes to [name].   Dema Severin BS, IBCLC 10/19/2023, 8:16 PM

## 2023-10-19 NOTE — Progress Notes (Signed)
  Echocardiogram 2D Echocardiogram has been performed.  Leda Roys RDCS 10/19/2023, 9:01 AM

## 2023-10-19 NOTE — Progress Notes (Signed)
Post Partum Day 3 Subjective: Patient has been up ad lib, voiding, and tolerating PO.  Patient complains of incisional pain, she just took some pain medication. She denies headache/vision changes, nausea, vomiting, chest pain or shortness of breath.   Objective: Blood pressure (!) 146/95, pulse 90, temperature 98.7 F (37.1 C), temperature source Oral, resp. rate 19, height 5\' 3"  (1.6 m), weight 111.5 kg, last menstrual period 01/23/2023, SpO2 97%, unknown if currently breastfeeding.     10/19/2023    7:51 AM 10/18/2023   11:44 PM 10/18/2023    7:51 PM  Vitals with BMI  Systolic 146 131 161  Diastolic 95 75 70  Pulse 90 91 98    Physical Exam:  General: alert, cooperative, and no distress CVS: s1, S2, Regular rate and rhythm Pulmonary: Clear to auscultation bilaterally Abdomen: Soft, appropriately tender to palpation near incision, umbilical honey comb dressing clean, dry and intact Lochia: appropriate Uterine Fundus: firm Incision:  DVT Evaluation: No evidence of DVT seen on physical exam. Calf/Ankle edema is present.  Recent Labs    10/17/23 2338 10/19/23 0550  HGB 13.0 7.7*  HCT 40.6 24.4*    Results for orders placed or performed during the hospital encounter of 10/16/23 (from the past 24 hour(s))  CBC     Status: Abnormal   Collection Time: 10/19/23  5:50 AM  Result Value Ref Range   WBC 7.5 4.0 - 10.5 K/uL   RBC 2.96 (L) 3.87 - 5.11 MIL/uL   Hemoglobin 7.7 (L) 12.0 - 15.0 g/dL   HCT 09.6 (L) 04.5 - 40.9 %   MCV 82.4 80.0 - 100.0 fL   MCH 26.0 26.0 - 34.0 pg   MCHC 31.6 30.0 - 36.0 g/dL   RDW 81.1 91.4 - 78.2 %   Platelets 151 150 - 400 K/uL   nRBC 0.0 0.0 - 0.2 %  Comprehensive metabolic panel     Status: Abnormal   Collection Time: 10/19/23  5:50 AM  Result Value Ref Range   Sodium 137 135 - 145 mmol/L   Potassium 3.6 3.5 - 5.1 mmol/L   Chloride 109 98 - 111 mmol/L   CO2 19 (L) 22 - 32 mmol/L   Glucose, Bld 92 70 - 99 mg/dL   BUN 9 6 - 20 mg/dL    Creatinine, Ser 9.56 (H) 0.44 - 1.00 mg/dL   Calcium 8.4 (L) 8.9 - 10.3 mg/dL   Total Protein 5.3 (L) 6.5 - 8.1 g/dL   Albumin 2.4 (L) 3.5 - 5.0 g/dL   AST 25 15 - 41 U/L   ALT 22 0 - 44 U/L   Alkaline Phosphatase 53 38 - 126 U/L   Total Bilirubin 0.4 <1.2 mg/dL   GFR, Estimated >21 >30 mL/min   Anion gap 9 5 - 15    Cardiac echo:  10/19/23: IMPRESSIONS     1. Left ventricular ejection fraction, by estimation, is 55%%. The left  ventricle has normal function. The left ventricle has no regional wall  motion abnormalities. The left ventricular internal cavity size was mildly  dilated. Left ventricular diastolic   parameters were normal.   2. Right ventricular systolic function is normal. The right ventricular  size is normal. Tricuspid regurgitation signal is inadequate for assessing  PA pressure.   3. The mitral valve is normal in structure. Mild mitral valve  regurgitation. No evidence of mitral stenosis.   4. The aortic valve is normal in structure. Aortic valve regurgitation is  trivial. No aortic stenosis  is present.   5. The inferior vena cava is normal in size with greater than 50%  respiratory variability, suggesting right atrial pressure of 3 mmHg.   Assessment/Plan: 32 y/o [id number] PPD # 3 and POD # 2 after postpartum tubal ligation,   - S/p anaphylaxis reaction to IV iron venofer, stable now and without any symptoms.  - With Chronic HTN and now with superimposed preeclampsia with severe features, management options discussed, IV magnesium sulfate for seizure prophylaxis recommended and patient agrees.  Patient unable to tolerate Procardia and labetalol.  Patient declines lisinopril.  C/s close BP follow up. Follow up labs tomorrow.  - with iron defiency anemia and s/p IV iron. - With PVCs and bigeminy detected on Telemetry.  Cardiac echo as ordered by Cardiology, follow up results and recommendations per cardiology.   LOS: 3 days   Prescilla Sours, MD 10/19/2023, 11:00 AM

## 2023-10-19 NOTE — Progress Notes (Signed)
   Patient Name: Theresa Horne Date of Encounter: 10/19/2023 [facility] Cardiologist: Dr. Jacques Navy  Interval Summary  .    No CP, feeling well. Started on IV mag by her OBGYN team.   Vital Signs .    Vitals:   10/18/23 1951 10/18/23 2344 10/19/23 0751 10/19/23 1127  BP: 136/70 131/75 (!) 146/95 139/68  Pulse: 98 91 90 91  Resp: 16 18 19 18   Temp: 97.6 F (36.4 C) 98.8 F (37.1 C) 98.7 F (37.1 C)   TempSrc: Oral Oral Oral   SpO2: 99% 97% 99% 98%  Weight:      Height:       No intake or output data in the 24 hours ending 10/19/23 1229    10/15/2023    7:00 AM 10/09/2023   10:31 AM 10/05/2023    4:23 PM  Last 3 Weights  Weight (lbs) 245 lb 14.4 oz 251 lb 6.4 oz 253 lb 4.8 oz  Weight (kg) 111.54 kg 114.034 kg 114.896 kg      Telemetry/ECG    Sinus with pvcs - Personally Reviewed  Physical Exam .   GEN: No acute distress.   Neck: No JVD Cardiac: IRRR Respiratory: Clear to auscultation bilaterally. GI: Soft, nontender, non-distended  MS: No edema  Assessment & Plan .     Principal Problem:   Hypertensive disorder Active Problems:   Pregnancy  - Echocardiogram within normal limits and grossly similar to prior.  - can consider prn metoprolol for palpitations if patient prefers. BP management per OBGYN. - no concerning symptoms at this time.  Cardiology will sign off, pt has follow up.  Partner and baby girl Theresa Horne in the room with her, all in good spirits.    For questions or updates, please contact [facility] Please consult [email or web address] for contact info under        Signed, Parke Poisson, MD

## 2023-10-20 LAB — COMPREHENSIVE METABOLIC PANEL
ALT: 81 U/L — ABNORMAL HIGH (ref 0–44)
AST: 103 U/L — ABNORMAL HIGH (ref 15–41)
Albumin: 2.5 g/dL — ABNORMAL LOW (ref 3.5–5.0)
Alkaline Phosphatase: 56 U/L (ref 38–126)
Anion gap: 10 (ref 5–15)
BUN: 6 mg/dL (ref 6–20)
CO2: 21 mmol/L — ABNORMAL LOW (ref 22–32)
Calcium: 7.5 mg/dL — ABNORMAL LOW (ref 8.9–10.3)
Chloride: 107 mmol/L (ref 98–111)
Creatinine, Ser: 0.8 mg/dL (ref 0.44–1.00)
GFR, Estimated: >60 mL/min (ref >60)
Glucose, Bld: 82 mg/dL (ref 70–99)
Potassium: 3.8 mmol/L (ref 3.5–5.1)
Sodium: 138 mmol/L (ref 135–145)
Total Bilirubin: 0.3 mg/dL (ref <1.2)
Total Protein: 5.5 g/dL — ABNORMAL LOW (ref 6.5–8.1)

## 2023-10-20 LAB — CBC
HCT: 24.5 % — ABNORMAL LOW (ref 36.0–46.0)
Hemoglobin: 7.7 g/dL — ABNORMAL LOW (ref 12.0–15.0)
MCH: 26.4 pg (ref 26.0–34.0)
MCHC: 31.4 g/dL (ref 30.0–36.0)
MCV: 83.9 fL (ref 80.0–100.0)
Platelets: 155 10*3/uL (ref 150–400)
RBC: 2.92 MIL/uL — ABNORMAL LOW (ref 3.87–5.11)
RDW: 15.1 % (ref 11.5–15.5)
WBC: 5.7 10*3/uL (ref 4.0–10.5)
nRBC: 0 % (ref 0.0–0.2)

## 2023-10-20 LAB — MAGNESIUM: Magnesium: 3.8 mg/dL — ABNORMAL HIGH (ref 1.7–2.4)

## 2023-10-20 MED ORDER — POLYETHYLENE GLYCOL 3350 17 G PO PACK
17.0000 g | PACK | Freq: Every day | ORAL | Status: DC
  Administered 2023-10-20 – 2023-10-22 (×3): 17 g via ORAL
  Filled 2023-10-20 (×3): qty 1

## 2023-10-20 NOTE — Progress Notes (Signed)
PPD# 4 SVD w/  Information for the patient's newborn:  Theresa Horne, Theresa Horne [[id number]]  female  Baby Name [name]   S:   Reports feeling good, denies HA, visual changes, RUQ pain, lightheadedness or dizziness with position changes. Voiding w/o difficulty, no pain in legs, but noted left leg is more swollen than right.  Tolerating PO fluid and solids No nausea or vomiting Bleeding is moderate, no clots Pain controlled with  PO meds Up ad lib / ambulatory / voiding w/o difficulty Feeding: Breast    O:   VS: BP 136/79 (BP Location: Left Arm)   Pulse 89   Temp 98.6 F (37 C) (Oral)   Resp 17   Ht 5\' 3"  (1.6 m)   Wt 111.5 kg   LMP 01/23/2023   SpO2 98%   Breastfeeding Unknown   BMI 43.56 kg/m   LABS:  Recent Labs    10/19/23 0550 10/20/23 0531  WBC 7.5 5.7  HGB 7.7* 7.7*  PLT 151 155   Blood type: --/--/O POS (11/18 0620) Rubella: Immune (05/02 0000)                      I&O: Intake/Output      11/21 0701 11/22 0700 11/22 0701 11/23 0700   P.O. 1000    I.V. (mL/kg) 679.6 (6.1)    Other 0    Total Intake(mL/kg) 1679.6 (15.1)    Urine (mL/kg/hr) 2800 (1)    Total Output 2800    Net -1120.4           Physical Exam: Alert and oriented X3 Lungs: Clear and unlabored Heart: regular rate and rhythm / no mumurs Abdomen: soft, non-tender, non-distended, dressing from sterilization is C/D/I without drainage Fundus: firm, non-tender, U-1 Perineum: intact Lochia: appropriate Extremities: 2+ pitting edema, negative for calf pain, tenderness, or cords    A:  PPD # 4  CHTN with SI pre-e w/ SF    -mild to moderate range BP    -magnesium off after 24 hrs at 1344 Anemia     -asymptomatic    -S/P anaphylactic reaction to iron infusion PVC and bigeminy of telemetry    -seen by cards today and follow-up plan discussed per pt (progress note for today pending)  P:  Routine postpartum orders Anticipate D/C on on PP day 4 or 5  Roma Schanz, DNP,  CNM 10/20/2023, 10:06 AM

## 2023-10-21 LAB — COMPREHENSIVE METABOLIC PANEL
ALT: 185 U/L — ABNORMAL HIGH (ref 0–44)
ALT: 204 U/L — ABNORMAL HIGH (ref 0–44)
ALT: 174 U/L — ABNORMAL HIGH (ref 0–44)
AST: 166 U/L — ABNORMAL HIGH (ref 15–41)
AST: 164 U/L — ABNORMAL HIGH (ref 15–41)
AST: 114 U/L — ABNORMAL HIGH (ref 15–41)
Albumin: 2.7 g/dL — ABNORMAL LOW (ref 3.5–5.0)
Albumin: 3.1 g/dL — ABNORMAL LOW (ref 3.5–5.0)
Albumin: 2.9 g/dL — ABNORMAL LOW (ref 3.5–5.0)
Alkaline Phosphatase: 52 U/L (ref 38–126)
Alkaline Phosphatase: 72 U/L (ref 38–126)
Alkaline Phosphatase: 73 U/L (ref 38–126)
Anion gap: 10 (ref 5–15)
Anion gap: 11 (ref 5–15)
Anion gap: 8 (ref 5–15)
BUN: 9 mg/dL (ref 6–20)
BUN: 7 mg/dL (ref 6–20)
BUN: 8 mg/dL (ref 6–20)
CO2: 20 mmol/L — ABNORMAL LOW (ref 22–32)
CO2: 22 mmol/L (ref 22–32)
CO2: 24 mmol/L (ref 22–32)
Calcium: 8.4 mg/dL — ABNORMAL LOW (ref 8.9–10.3)
Calcium: 9.2 mg/dL (ref 8.9–10.3)
Calcium: 9.5 mg/dL (ref 8.9–10.3)
Chloride: 106 mmol/L (ref 98–111)
Chloride: 105 mmol/L (ref 98–111)
Chloride: 104 mmol/L (ref 98–111)
Creatinine, Ser: 0.81 mg/dL (ref 0.44–1.00)
Creatinine, Ser: 0.81 mg/dL (ref 0.44–1.00)
Creatinine, Ser: 0.92 mg/dL (ref 0.44–1.00)
GFR, Estimated: >60 mL/min (ref >60)
GFR, Estimated: >60 mL/min (ref >60)
GFR, Estimated: >60 mL/min (ref >60)
Glucose, Bld: 76 mg/dL (ref 70–99)
Glucose, Bld: 84 mg/dL (ref 70–99)
Glucose, Bld: 93 mg/dL (ref 70–99)
Potassium: 4 mmol/L (ref 3.5–5.1)
Potassium: 4.4 mmol/L (ref 3.5–5.1)
Potassium: 3.9 mmol/L (ref 3.5–5.1)
Sodium: 136 mmol/L (ref 135–145)
Sodium: 138 mmol/L (ref 135–145)
Sodium: 136 mmol/L (ref 135–145)
Total Bilirubin: 0.5 mg/dL (ref <1.2)
Total Bilirubin: <0.2 mg/dL (ref <1.2)
Total Bilirubin: 0.3 mg/dL (ref <1.2)
Total Protein: 6 g/dL — ABNORMAL LOW (ref 6.5–8.1)
Total Protein: 6.8 g/dL (ref 6.5–8.1)
Total Protein: 6.5 g/dL (ref 6.5–8.1)

## 2023-10-21 LAB — CBC
HCT: 26.2 % — ABNORMAL LOW (ref 36.0–46.0)
HCT: 29.8 % — ABNORMAL LOW (ref 36.0–46.0)
HCT: 28.2 % — ABNORMAL LOW (ref 36.0–46.0)
Hemoglobin: 8.2 g/dL — ABNORMAL LOW (ref 12.0–15.0)
Hemoglobin: 9.3 g/dL — ABNORMAL LOW (ref 12.0–15.0)
Hemoglobin: 8.8 g/dL — ABNORMAL LOW (ref 12.0–15.0)
MCH: 26.4 pg (ref 26.0–34.0)
MCH: 26.5 pg (ref 26.0–34.0)
MCH: 26.1 pg (ref 26.0–34.0)
MCHC: 31.3 g/dL (ref 30.0–36.0)
MCHC: 31.2 g/dL (ref 30.0–36.0)
MCHC: 31.2 g/dL (ref 30.0–36.0)
MCV: 84.2 fL (ref 80.0–100.0)
MCV: 84.9 fL (ref 80.0–100.0)
MCV: 83.7 fL (ref 80.0–100.0)
Platelets: 183 10*3/uL (ref 150–400)
Platelets: 217 10*3/uL (ref 150–400)
Platelets: 215 10*3/uL (ref 150–400)
RBC: 3.11 MIL/uL — ABNORMAL LOW (ref 3.87–5.11)
RBC: 3.51 MIL/uL — ABNORMAL LOW (ref 3.87–5.11)
RBC: 3.37 MIL/uL — ABNORMAL LOW (ref 3.87–5.11)
RDW: 15.2 % (ref 11.5–15.5)
RDW: 15.2 % (ref 11.5–15.5)
RDW: 15.2 % (ref 11.5–15.5)
WBC: 5.2 10*3/uL (ref 4.0–10.5)
WBC: 6.4 10*3/uL (ref 4.0–10.5)
WBC: 6.1 10*3/uL (ref 4.0–10.5)
nRBC: 0 % (ref 0.0–0.2)
nRBC: 0 % (ref 0.0–0.2)
nRBC: 0.3 % — ABNORMAL HIGH (ref 0.0–0.2)

## 2023-10-21 MED ORDER — AMLODIPINE BESYLATE 5 MG PO TABS
5.0000 mg | ORAL_TABLET | Freq: Once | ORAL | Status: AC
  Administered 2023-10-21: 5 mg via ORAL

## 2023-10-21 MED ORDER — BISACODYL 10 MG RE SUPP: 10.0000 mg | Freq: Once | RECTAL | Status: DC

## 2023-10-21 MED ORDER — HYDROCHLOROTHIAZIDE 25 MG PO TABS
25.0000 mg | ORAL_TABLET | Freq: Every day | ORAL | Status: DC
  Administered 2023-10-21 – 2023-10-22 (×2): 25 mg via ORAL
  Filled 2023-10-21 (×2): qty 1

## 2023-10-21 MED ORDER — AMLODIPINE BESYLATE 5 MG PO TABS
5.0000 mg | ORAL_TABLET | Freq: Every day | ORAL | Status: DC
  Administered 2023-10-21: 5 mg via ORAL
  Filled 2023-10-21 (×3): qty 1

## 2023-10-21 MED ORDER — SENNOSIDES-DOCUSATE SODIUM 8.6-50 MG PO TABS
2.0000 | ORAL_TABLET | Freq: Two times a day (BID) | ORAL | Status: DC
Start: 2023-10-21 — End: 2023-10-22
  Administered 2023-10-21 – 2023-10-22 (×2): 2 via ORAL
  Filled 2023-10-21 (×2): qty 2

## 2023-10-21 MED ORDER — AMLODIPINE BESYLATE 5 MG PO TABS
10.0000 mg | ORAL_TABLET | Freq: Every day | ORAL | Status: DC
  Administered 2023-10-22: 10 mg via ORAL
  Filled 2023-10-21: qty 2

## 2023-10-21 MED ORDER — HYDROCORTISONE ACETATE 25 MG RE SUPP
25.0000 mg | Freq: Two times a day (BID) | RECTAL | Status: DC
  Administered 2023-10-21: 25 mg via RECTAL
  Filled 2023-10-21 (×2): qty 1

## 2023-10-21 MED ORDER — LIDOCAINE 5 % EX OINT
TOPICAL_OINTMENT | Freq: Four times a day (QID) | CUTANEOUS | Status: DC | PRN
  Filled 2023-10-21: qty 35.44

## 2023-10-21 NOTE — Progress Notes (Signed)
PPD# 5 SVD w/ intact perineum Information for the patient's newborn:  Shahrzad, Cheramie [[id number]]  female  Baby Name [name]    S:   Reports feeling good, much better than yesterday Tolerating PO fluid and solids No nausea or vomiting Bleeding is light Pain controlled with  PO meds Up ad lib / ambulatory / voiding w/o difficulty Feeding: Breast    O:   VS: BP (!) 142/93   Pulse 81   Temp 98.4 F (36.9 C) (Oral)   Resp 18   Ht 5\' 3"  (1.6 m)   Wt 111.5 kg   LMP 01/23/2023   SpO2 98%   Breastfeeding Unknown   BMI 43.56 kg/m   LABS:  Recent Labs    10/19/23 0550 10/20/23 0531  WBC 7.5 5.7  HGB 7.7* 7.7*  PLT 151 155   Blood type: --/--/O POS (11/18 0620) Rubella: Immune (05/02 0000)                      I&O: Intake/Output      11/22 0701 11/23 0700 11/23 0701 11/24 0700   P.O. 660    I.V. (mL/kg) 250.4 (2.2)    Other     Total Intake(mL/kg) 910.4 (8.2)    Urine (mL/kg/hr) 900 (0.3)    Total Output 900    Net +10.4           Physical Exam: Alert and oriented X3 Lungs: Clear and unlabored Heart: regular rate and rhythm / no mumurs Abdomen: soft, non-tender, non-distended  Fundus: firm, non-tender, U-1 Perineum: intact Lochia: light Extremities: 2+ non-pitting edema improvement from 11/22 negative for calf pain, tenderness, or cords    A/P:  PPD # 5  CHTN with SI pre-e w/ SF    -mild to moderate range BP since mag was discontinued    -neg neuro symptoms    -starting amlodipine and HCTZ Anemia     -asymptomatic    -S/P anaphylactic reaction to iron infusion PVC and bigeminy of telemetry    -seen by cards 11/22 and follow-up plan has been made    -from cardiologist note- Echocardiogram within normal limits and grossly similar to prior.  - can consider prn metoprolol for palpitations if patient prefers. BP management per OBGYN. - no concerning symptoms at this time. Pt denies palpitations, plan developed with Dr. Su Hilt to start  amlodipine based on patient's experience with other meds Plan developed with Dr. Evorn Gong, DNP, CNM 10/21/2023, 7:21 AM

## 2023-10-21 NOTE — Progress Notes (Addendum)
Pt is asymptomatic BP still mildly elevated Will increase dose of amlodipine to 10mg  daily C/o hemorrhoid/?thrombosed - declined exam Suppository and lidocaine ointment ordered ALT mildly increase since this morning and AST mildly decreased Will repeat labs at 7p and in the morning Bilateral LEs with decreased swelling, no tenderness and no cords Good UOP

## 2023-10-22 DIAGNOSIS — O119 Pre-existing hypertension with pre-eclampsia, unspecified trimester: Secondary | ICD-10-CM | POA: Diagnosis present

## 2023-10-22 DIAGNOSIS — Z3490 Encounter for supervision of normal pregnancy, unspecified, unspecified trimester: Secondary | ICD-10-CM | POA: Diagnosis present

## 2023-10-22 DIAGNOSIS — D509 Iron deficiency anemia, unspecified: Secondary | ICD-10-CM | POA: Diagnosis present

## 2023-10-22 DIAGNOSIS — I499 Cardiac arrhythmia, unspecified: Secondary | ICD-10-CM | POA: Diagnosis not present

## 2023-10-22 LAB — COMPREHENSIVE METABOLIC PANEL
ALT: 148 U/L — ABNORMAL HIGH (ref 0–44)
AST: 82 U/L — ABNORMAL HIGH (ref 15–41)
Albumin: 2.9 g/dL — ABNORMAL LOW (ref 3.5–5.0)
Alkaline Phosphatase: 70 U/L (ref 38–126)
Anion gap: 10 (ref 5–15)
BUN: 11 mg/dL (ref 6–20)
CO2: 21 mmol/L — ABNORMAL LOW (ref 22–32)
Calcium: 9.5 mg/dL (ref 8.9–10.3)
Chloride: 104 mmol/L (ref 98–111)
Creatinine, Ser: 1.08 mg/dL — ABNORMAL HIGH (ref 0.44–1.00)
GFR, Estimated: >60 mL/min (ref >60)
Glucose, Bld: 87 mg/dL (ref 70–99)
Potassium: 3.9 mmol/L (ref 3.5–5.1)
Sodium: 135 mmol/L (ref 135–145)
Total Bilirubin: 0.4 mg/dL (ref <1.2)
Total Protein: 6.2 g/dL — ABNORMAL LOW (ref 6.5–8.1)

## 2023-10-22 LAB — CBC
HCT: 28.5 % — ABNORMAL LOW (ref 36.0–46.0)
Hemoglobin: 8.7 g/dL — ABNORMAL LOW (ref 12.0–15.0)
MCH: 25.7 pg — ABNORMAL LOW (ref 26.0–34.0)
MCHC: 30.5 g/dL (ref 30.0–36.0)
MCV: 84.1 fL (ref 80.0–100.0)
Platelets: 207 10*3/uL (ref 150–400)
RBC: 3.39 MIL/uL — ABNORMAL LOW (ref 3.87–5.11)
RDW: 15.5 % (ref 11.5–15.5)
WBC: 6 10*3/uL (ref 4.0–10.5)
nRBC: 0 % (ref 0.0–0.2)

## 2023-10-22 MED ORDER — HYDROCORTISONE ACETATE 25 MG RE SUPP: 25.0000 mg | Freq: Two times a day (BID) | RECTAL | 0 refills | Status: DC

## 2023-10-22 MED ORDER — AMLODIPINE BESYLATE 10 MG PO TABS: 10.0000 mg | ORAL_TABLET | Freq: Every day | ORAL | 1 refills | Status: DC

## 2023-10-22 MED ORDER — SENNOSIDES-DOCUSATE SODIUM 8.6-50 MG PO TABS: 2.0000 | ORAL_TABLET | Freq: Two times a day (BID) | ORAL | 0 refills | Status: DC

## 2023-10-22 MED ORDER — HYDROCORTISONE (PERIANAL) 2.5 % EX CREA: TOPICAL_CREAM | Freq: Two times a day (BID) | CUTANEOUS | 0 refills | Status: DC | PRN

## 2023-10-22 MED ORDER — OXYCODONE HCL 5 MG PO TABS: 5.0000 mg | ORAL_TABLET | ORAL | 0 refills | Status: DC | PRN

## 2023-10-22 MED ORDER — IBUPROFEN 600 MG PO TABS: 600.0000 mg | ORAL_TABLET | Freq: Four times a day (QID) | ORAL | 0 refills | Status: DC

## 2023-10-22 NOTE — Discharge Summary (Signed)
SVD OB Discharge Summary       Patient Name: Theresa Horne DOB: [birth year] MRN: [id number]  Date of admission: 10/16/2023 Delivering MD: Osborn Coho Date of delivery: 10/16/2023 Type of delivery: SVD  Newborn Data: Sex: Baby female Circumcision: N/A Live born female  Birth Weight: 6 lb 15.8 oz (3170 g) APGAR: 8, 9  Newborn Delivery   Birth date/time: 10/16/2023 15:40:00 Delivery type: Vaginal, Spontaneous     Feeding: breast and bottle Infant being discharge to home with mother in stable condition.   Admitting diagnosis: Hypertensive disorder [I10] Pregnancy [Z34.90] Intrauterine pregnancy: [redacted]w[redacted]d     Secondary diagnosis:  Principal Problem:   Hypertensive disorder Active Problems:   SVD (spontaneous vaginal delivery)   Encounter for induction of labor   Arrhythmia   Chronic hypertension with superimposed pre-eclampsia   IDA (iron deficiency anemia)                                Complications: None                                                              Intrapartum Procedures: spontaneous vaginal delivery and GBS prophylaxis Postpartum Procedures: P.P. tubal ligation Complications-Operative and Postpartum: none Augmentation: AROM and Pitocin   History of Present Illness: Theresa Horne is a 32 y.o. female, [id number], who presents at [redacted]w[redacted]d weeks gestation. The patient has been followed at  [facility]  Her pregnancy has been complicated by:  Patient Active Problem List   Diagnosis Date Noted   SVD (spontaneous vaginal delivery) 10/22/2023   Encounter for induction of labor 10/22/2023   Arrhythmia 10/22/2023   Chronic hypertension with superimposed pre-eclampsia 10/22/2023   IDA (iron deficiency anemia) 10/22/2023   Hypertensive disorder 10/16/2023   History of preterm delivery, currently pregnant 06/13/2023   Obesity affecting pregnancy 06/13/2023   History of hypertension 06/13/2023     Active  Ambulatory Problems    Diagnosis Date Noted   History of preterm delivery, currently pregnant 06/13/2023   Obesity affecting pregnancy 06/13/2023   History of hypertension 06/13/2023   Resolved Ambulatory Problems    Diagnosis Date Noted   Active labor at term 10/03/2015   SVD (spontaneous vaginal delivery) 10/03/2015   Second-degree perineal laceration, with delivery 10/03/2015   Group beta Strep positive 10/03/2015   Rubella nonimmune status, delivered, current hospitalization 10/03/2015   Past Medical History:  Diagnosis Date   Hypertension    Preterm labor    STD (sexually transmitted disease)      Hospital course:  Induction of Labor With Vaginal Delivery   32 y.o. yo [id number] at [redacted]w[redacted]d was admitted to the hospital 10/16/2023 for induction of labor.  Indication for induction:  CHTN  .  Patient had an labor course complicated by pt presented on 11/18 for IOL for [facility] that turned into superimposed PreE with SF, pt was on labetalol 100mg  BID, BP not well controlled, progressed to SVD on 11/18 with AROM and pitocin , gbs+ and tx with 3 doses pcn,, had a bay female over intact perineum, ebl was , hgb was 8-13.1-13-7.7-7.7-8.2-9.3., asymptomatic and on po iron.  Membrane Rupture Time/Date: 2:28 PM,10/16/2023  Delivery Method:Vaginal, Spontaneous Operative  Delivery:N/A Episiotomy: None Lacerations:  None Details of delivery can be found in separate delivery note.  Patient had a postpartum course complicated by pt has persistent elevated Bps and elevated AST/ALT, on 11/21 was s/p 24 hour mag on 11/22, labeatol was stop and linsopril was given 5mg  which no relief, that was d/c and amlodipine 10mg  was started on 11/23, also received 1 dose 25mg  hydrochlorothiazide, pt to go home on amlodipine 10mg , current AST/ALT trending down and BP is normotensive 125/65, AST 25-103-166-164-114-82, ALT 22-81-185-204-174-148, pt also was given IV transfusion venofer on 11/19 with an allergic reaction,  which resolved, pt also started havin PVC and bigeminy heart rate tracing, was cleared by card and had an unremarkable echo, will f/u PP. , pt went for BTL on 11/19 and recovered good.  . Patient is discharged home 10/22/23.  Newborn Data: Birth date:10/16/2023 Birth time:3:40 PM Gender:Female Living status:Living Apgars:8 ,9  Weight:3170 g Postpartum Day # 6 : Patient up ad lib, denies syncope or dizziness. Reports consuming regular diet without issues and denies N/V. Patient reports 0 bowel movement + passing flatus.  Denies issues with urination and reports bleeding is "lighter."  Patient is br/bt feeding and reports going well.  Desires received BTL in pt 11/19 for postpartum contraception.  Pain is being appropriately managed with use of po meds. Pt c/o of constipation and hemorrhoid which since she has had a small BM and relieved post with hemorrhoid creams.   Physical exam  Vitals:   10/21/23 2032 10/21/23 2330 10/22/23 0435 10/22/23 0910  BP: 139/77 122/81 125/65 (!) 141/82  Pulse: 68 70 83 (!) 49  Resp: 17 18 18 19   Temp: 98.4 F (36.9 C) 99 F (37.2 C) 99.1 F (37.3 C) 99.1 F (37.3 C)  TempSrc: Oral Oral Oral Oral  SpO2: 98% 98% 97% 98%  Weight:      Height:       General: alert, cooperative, and no distress Lochia: appropriate Uterine Fundus: firm Incision: Healing well with no significant drainage, No significant erythema, Dressing is clean, dry, and intact, honeycomb dressing CDI Perineum: intact DVT Evaluation: No evidence of DVT seen on physical exam. Negative Homan's sign. No cords or calf tenderness. No significant calf/ankle edema.  Labs: Lab Results  Component Value Date   WBC 6.0 10/22/2023   HGB 8.7 (L) 10/22/2023   HCT 28.5 (L) 10/22/2023   MCV 84.1 10/22/2023   PLT 207 10/22/2023      Latest Ref Rng & Units 10/22/2023    5:29 AM  CMP  Glucose 70 - 99 mg/dL 87   BUN 6 - 20 mg/dL 11   Creatinine 4.09 - 1.00 mg/dL 8.11   Sodium 914 - 782  mmol/L 135   Potassium 3.5 - 5.1 mmol/L 3.9   Chloride 98 - 111 mmol/L 104   CO2 22 - 32 mmol/L 21   Calcium 8.9 - 10.3 mg/dL 9.5   Total Protein 6.5 - 8.1 g/dL 6.2   Total Bilirubin <9.5 mg/dL 0.4   Alkaline Phos 38 - 126 U/L 70   AST 15 - 41 U/L 82   ALT 0 - 44 U/L 148     Date of discharge: 10/22/2023 Discharge Diagnoses: Term Pregnancy-delivered and Preelampsia Discharge instruction: per After Visit Summary and "Baby and Me Booklet".  After visit meds:   Activity:           unrestricted and pelvic rest Advance as tolerated. Pelvic rest for 6 weeks.  Diet:  routine Medications: PNV, Ibuprofen, Colace, Iron, and amlodipine 10mg , oxy IR 5mg , annusol supp, hem tx.  Postpartum contraception: Tubal Ligation Condition:  Pt discharge to home with baby in stable condition  Meds: Allergies as of 10/22/2023       Reactions   Venofer [iron Sucrose] Anaphylaxis, Hives, Swelling, Rash        Medication List     STOP taking these medications    NIFEdipine 30 MG 24 hr tablet Commonly known as: ADALAT CC       TAKE these medications    amLODipine 10 MG tablet Commonly known as: NORVASC Take 1 tablet (10 mg total) by mouth daily.   calcium carbonate 750 MG chewable tablet Commonly known as: TUMS EX Chew 1 tablet by mouth daily.   Hydrocortisone (Perianal) 1 % Crea Commonly known as: Preparation H Soothing Relief Use three times a day as needed for hemorrhoid discomfort relief What changed: Another medication with the same name was added. Make sure you understand how and when to take each.   hydrocortisone 2.5 % rectal cream Commonly known as: ANUSOL-HC Apply topically 2 (two) times daily as needed for hemorrhoids or anal itching (hemorrhoids). What changed: You were already taking a medication with the same name, and this prescription was added. Make sure you understand how and when to take each.   hydrocortisone 25 MG suppository Commonly known as:  ANUSOL-HC Place 1 suppository (25 mg total) rectally 2 (two) times daily.   ibuprofen 600 MG tablet Commonly known as: ADVIL Take 1 tablet (600 mg total) by mouth every 6 (six) hours.   oxyCODONE 5 MG immediate release tablet Commonly known as: Oxy IR/ROXICODONE Take 1 tablet (5 mg total) by mouth every 4 (four) hours as needed for moderate pain (pain score 4-6).   senna-docusate 8.6-50 MG tablet Commonly known as: Senokot-S Take 2 tablets by mouth 2 (two) times daily.   witch hazel-glycerin pad Commonly known as: TUCKS Apply topically as needed for itching.               Discharge Care Instructions  (From admission, onward)           Start     Ordered   10/22/23 0000  Discharge wound care:       Comments: Take dressing off on day 5-7 postpartum.  Report increased drainage, redness or warmth. Clean with water, let soap trickle down body. Can leave steri strips on until they fall off or take them off gently at day 10. Keep open to air, clean and dry.   10/22/23 0954            Discharge Follow Up:   Follow-up Information     [facility]. Schedule an appointment as soon as possible for a visit in 1 week(s).   Specialty: Obstetrics and Gynecology Why: 1 week BP and CMP check with 6 week PPV Contact information: [address] [place] [address] [place] [address] [phone]                 [facility] CNM, FNP-C, PMHNP-BC  [address]  [place], [place] [address]  Cell: [phone]  Office Phone: [phone] Fax: [phone] 10/22/2023  9:55 AM

## 2023-10-23 ENCOUNTER — Other Ambulatory Visit: Payer: Self-pay | Admitting: Family Medicine

## 2023-10-25 ENCOUNTER — Telehealth (HOSPITAL_COMMUNITY): Payer: Self-pay | Admitting: *Deleted

## 2023-10-25 NOTE — Telephone Encounter (Signed)
10/25/2023  Name: Theresa Horne MRN: [id number] DOB: 11/06/1991  Reason for Call:  Transition of Care Hospital Discharge Call  Contact Status: Patient Contact Status: Complete  Language assistant needed: Interpreter Mode: Interpreter Not Needed        Follow-Up Questions: Do You Have Any Concerns About Your Health As You Heal From Delivery?: No Do You Have Any Concerns About Your Infants Health?: No  Edinburgh Postnatal Depression Scale:  In the Past 7 Days: I have been able to laugh and see the funny side of things.: As much as I always could I have looked forward with enjoyment to things.: As much as I ever did I have blamed myself unnecessarily when things went wrong.: No, never I have been anxious or worried for no good reason.: No, not at all I have felt scared or panicky for no good reason.: No, not at all Things have been getting on top of me.: No, I have been coping as well as ever I have been so unhappy that I have had difficulty sleeping.: Not at all I have felt sad or miserable.: No, not at all I have been so unhappy that I have been crying.: No, never The thought of harming myself has occurred to me.: Never Inocente Salles Postnatal Depression Scale Total: 0  PHQ2-9 Depression Scale:     Discharge Follow-up: Edinburgh score requires follow up?: No Patient was advised of the following resources:: Support Group, Breastfeeding Support Group (declines email with detailed information)  Post-discharge interventions: Reviewed Newborn Safe Sleep Practices  Theresa Horne  10/25/2023 1128

## 2023-11-08 ENCOUNTER — Ambulatory Visit: Payer: BC Managed Care – PPO | Attending: Cardiology | Admitting: Cardiology

## 2023-11-23 DIAGNOSIS — O9279 Other disorders of lactation: Secondary | ICD-10-CM | POA: Diagnosis not present

## 2023-11-23 DIAGNOSIS — O99893 Other specified diseases and conditions complicating puerperium: Secondary | ICD-10-CM | POA: Diagnosis not present

## 2023-11-23 DIAGNOSIS — N644 Mastodynia: Secondary | ICD-10-CM | POA: Diagnosis not present

## 2023-11-25 DIAGNOSIS — I10 Essential (primary) hypertension: Secondary | ICD-10-CM | POA: Insufficient documentation

## 2023-11-25 DIAGNOSIS — R748 Abnormal levels of other serum enzymes: Secondary | ICD-10-CM | POA: Insufficient documentation

## 2023-11-25 DIAGNOSIS — O119 Pre-existing hypertension with pre-eclampsia, unspecified trimester: Secondary | ICD-10-CM | POA: Insufficient documentation

## 2023-11-25 DIAGNOSIS — R638 Other symptoms and signs concerning food and fluid intake: Secondary | ICD-10-CM | POA: Insufficient documentation

## 2023-11-25 DIAGNOSIS — D649 Anemia, unspecified: Secondary | ICD-10-CM | POA: Insufficient documentation

## 2024-02-03 ENCOUNTER — Telehealth: Admitting: Nurse Practitioner

## 2024-02-03 ENCOUNTER — Other Ambulatory Visit: Payer: Self-pay | Admitting: Nurse Practitioner

## 2024-02-03 DIAGNOSIS — R3989 Other symptoms and signs involving the genitourinary system: Secondary | ICD-10-CM

## 2024-02-03 MED ORDER — CEPHALEXIN 500 MG PO CAPS: 500.0000 mg | ORAL_CAPSULE | Freq: Two times a day (BID) | ORAL | 0 refills | Status: AC

## 2024-02-03 MED ORDER — CEPHALEXIN 500 MG PO CAPS: 500.0000 mg | ORAL_CAPSULE | Freq: Two times a day (BID) | ORAL | 0 refills | Status: DC

## 2024-02-03 NOTE — Progress Notes (Signed)

## 2024-02-03 NOTE — Progress Notes (Signed)
 I have spent 5 minutes in review of e-visit questionnaire, review and updating patient chart, medical decision making and response to patient.   Claiborne Rigg, NP

## 2024-05-22 ENCOUNTER — Ambulatory Visit
Admission: EM | Admit: 2024-05-22 | Discharge: 2024-05-22 | Disposition: A | Discharge status: Home / Self Care | Attending: Physician Assistant | Admitting: Physician Assistant

## 2024-05-22 DIAGNOSIS — I87309 Chronic venous hypertension (idiopathic) without complications of unspecified lower extremity: Secondary | ICD-10-CM | POA: Insufficient documentation

## 2024-05-22 DIAGNOSIS — R030 Elevated blood-pressure reading, without diagnosis of hypertension: Secondary | ICD-10-CM | POA: Insufficient documentation

## 2024-05-22 DIAGNOSIS — Z3A34 34 weeks gestation of pregnancy: Secondary | ICD-10-CM | POA: Insufficient documentation

## 2024-05-22 DIAGNOSIS — N390 Urinary tract infection, site not specified: Secondary | ICD-10-CM | POA: Diagnosis not present

## 2024-05-22 DIAGNOSIS — Z3A16 16 weeks gestation of pregnancy: Secondary | ICD-10-CM | POA: Insufficient documentation

## 2024-05-22 DIAGNOSIS — B3731 Acute candidiasis of vulva and vagina: Secondary | ICD-10-CM | POA: Diagnosis not present

## 2024-05-22 DIAGNOSIS — Z3A38 38 weeks gestation of pregnancy: Secondary | ICD-10-CM | POA: Insufficient documentation

## 2024-05-22 DIAGNOSIS — E559 Vitamin D deficiency, unspecified: Secondary | ICD-10-CM | POA: Insufficient documentation

## 2024-05-22 LAB — POCT URINALYSIS DIP (MANUAL ENTRY)
Bilirubin, UA: NEGATIVE
Blood, UA: NEGATIVE
Glucose, UA: NEGATIVE mg/dL
Ketones, POC UA: NEGATIVE mg/dL
Nitrite, UA: NEGATIVE
Protein Ur, POC: NEGATIVE mg/dL
Spec Grav, UA: >=1.03 — AB (ref 1.010–1.025)
Urobilinogen, UA: 0.2 U/dL
pH, UA: 6 (ref 5.0–8.0)

## 2024-05-22 MED ORDER — FLUCONAZOLE 150 MG PO TABS: ORAL_TABLET | ORAL | 0 refills | Status: DC

## 2024-05-22 MED ORDER — CEPHALEXIN 500 MG PO CAPS: 500.0000 mg | ORAL_CAPSULE | Freq: Four times a day (QID) | ORAL | 0 refills | Status: DC

## 2024-05-22 NOTE — Discharge Instructions (Addendum)
 Return if any problems.

## 2024-05-22 NOTE — ED Triage Notes (Signed)
 I feel like I am having a UTI, I have medicine for it from possibly having it prior, I also think that has given me a yeast infection. No fever.

## 2024-05-22 NOTE — ED Provider Notes (Signed)
 Theresa Horne    CSN: [id number] Arrival date & time: 05/22/24  1250      History   Chief Complaint Chief Complaint  Patient presents with   UTI Symptoms    HPI Theresa Horne is a 33 y.o. female.   Patient complains of UTI symptoms.  Patient started taking an old prescription for Keflex .  Patient reports now she is having UTI symptoms as well as symptoms of a yeast infection.  Patient denies any fever or chills she is not having any abdominal pain.  The history is provided by the patient. No language interpreter was used.    Past Medical History:  Diagnosis Date   Hypertension    Preterm labor    STD (sexually transmitted disease)    Chlamydia    Patient Active Problem List   Diagnosis Date Noted   [redacted] weeks gestation of pregnancy 05/22/2024   [redacted] weeks gestation of pregnancy 05/22/2024   [redacted] weeks gestation of pregnancy 05/22/2024   Chronic peripheral venous hypertension 05/22/2024   Elevated blood-pressure reading without diagnosis of hypertension 05/22/2024   Vitamin D deficiency 05/22/2024   Pre-eclampsia added to pre-existing hypertension 11/25/2023   Anemia 11/25/2023   Elevated liver enzymes 11/25/2023   Increased body mass index 11/25/2023   SVD (spontaneous vaginal delivery) 10/22/2023   Encounter for induction of labor 10/22/2023   Arrhythmia 10/22/2023   Chronic hypertension with superimposed pre-eclampsia 10/22/2023   IDA (iron  deficiency anemia) 10/22/2023   Unspecified maternal hypertension, unspecified trimester 09/27/2023   Small for gestational age fetus affecting management of mother 07/21/2023   Primary thrombocytopenia (HCC) 07/07/2023   Anemia of pregnancy 07/07/2023   Obesity affecting pregnancy 06/13/2023   Request for sterilization 06/08/2023   Missed abortion 06/08/2021   Pregnancy 06/05/2021   Carrier of group B Streptococcus 04/11/2019   History of hypertension 10/12/2018   Not immune to rubella 10/08/2018   Obesity  affecting pregnancy, antepartum 10/03/2018   History of preterm delivery 09/27/2018   History of abnormal cervical Papanicolaou smear 09/27/2018   Bacterial vaginosis 03/12/2015    Past Surgical History:  Procedure Laterality Date   INDUCED ABORTION     LAPAROSCOPIC TUBAL LIGATION     TUBAL LIGATION N/A 10/17/2023   Procedure: POST PARTUM TUBAL LIGATION;  Surgeon: Gloriann Chick, MD;  Location: [facility];  Service: Gynecology;  Laterality: N/A;    OB History     Gravida  6   Para  4   Term  3   Preterm  1   AB  2   Living  4      SAB  1   IAB  1   Ectopic      Multiple  0   Live Births  4            Home Medications    Prior to Admission medications   Medication Sig Start Date End Date Taking? Authorizing Provider  cephALEXin  (KEFLEX ) 500 MG capsule Take 1 capsule (500 mg total) by mouth 4 (four) times daily. 05/22/24  Yes Kaeden Depaz K, PA-C  fluconazole  (DIFLUCAN ) 150 MG tablet One tablet today then again on day 3 05/22/24  Yes Flint Sonny MARLA, PA-C  amLODipine  (NORVASC ) 10 MG tablet Take 1 tablet (10 mg total) by mouth daily. 10/22/23   Horne , Jade, FNP  calcium carbonate (TUMS EX) 750 MG chewable tablet Chew 1 tablet by mouth daily.    [provider]  hydrocortisone  2.5 % cream Apply  1 Application topically 3 (three) times daily.    [provider]  ibuprofen  (ADVIL ) 600 MG tablet Take 1 tablet (600 mg total) by mouth every 6 (six) hours. 10/22/23   Horne , Jade, FNP  metoprolol  succinate (TOPROL -XL) 25 MG 24 hr tablet Take 1 tablet (25 mg total) by mouth daily. 01/04/21 02/06/21  Loni Soyla LABOR, MD    Family History Family History  Problem Relation Age of Onset   Hypertension Mother    Diabetes Father    Hypertension Father    Diabetes Paternal Uncle    Renal Disease Maternal Grandmother    Diabetes Paternal Grandfather     Social History Social History   Tobacco Use   Smoking status: Former    Current packs/day: 0.00     Types: Cigarettes    Quit date: 01/26/2015    Years since quitting: 9.3    Passive exposure: Never   Smokeless tobacco: Never  Vaping Use   Vaping status: Never Used  Substance Use Topics   Alcohol use: Not Currently   Drug use: Not Currently    Types: Marijuana     Allergies   Iron  sucrose   Review of Systems Review of Systems  All other systems reviewed and are negative.    Physical Exam Triage Vital Signs ED Triage Vitals  Encounter Vitals Group     BP 05/22/24 1329 (!) 144/84     Girls Systolic BP Percentile --      Girls Diastolic BP Percentile --      Boys Systolic BP Percentile --      Boys Diastolic BP Percentile --      Pulse Rate 05/22/24 1329 (!) 44     Resp 05/22/24 1329 18     Temp 05/22/24 1329 98.6 F (37 C)     Temp Source 05/22/24 1329 Oral     SpO2 05/22/24 1329 98 %     Weight 05/22/24 1325 235 lb (106.6 kg)     Height 05/22/24 1325 5' 4 (1.626 m)     Head Circumference --      Peak Flow --      Pain Score 05/22/24 1322 0     Pain Loc --      Pain Education --      Exclude from Growth Chart --    No data found.  Updated Vital Signs BP (!) 144/84 (BP Location: Left Arm)   Pulse (!) 46 Comment: thats my normal  Temp 98.6 F (37 C) (Oral)   Resp 18   Ht 5' 4 (1.626 m)   Wt 106.6 kg   LMP 05/06/2024 (Exact Date)   SpO2 98%   Breastfeeding No   BMI 40.34 kg/m   Visual Acuity Right Eye Distance:   Left Eye Distance:   Bilateral Distance:    Right Eye Near:   Left Eye Near:    Bilateral Near:     Physical Exam Vitals and nursing note reviewed.  Constitutional:      Appearance: She is well-developed.  HENT:     Head: Normocephalic.   Cardiovascular:     Rate and Rhythm: Normal rate.  Pulmonary:     Effort: Pulmonary effort is normal.  Abdominal:     General: There is no distension.   Musculoskeletal:        General: Normal range of motion.   Skin:    General: Skin is warm.   Neurological:     General: No  focal deficit  present.     Mental Status: She is alert and oriented to person, place, and time.      UC Treatments / Results  Labs (all labs ordered are listed, but only abnormal results are displayed) Labs Reviewed  POCT URINALYSIS DIP (MANUAL ENTRY) - Abnormal; Notable for the following components:      Result Value   Clarity, UA cloudy (*)    Spec Grav, UA >=1.030 (*)    Leukocytes, UA Small (1+) (*)    All other components within normal limits    EKG   Radiology No results found.  Procedures Procedures (including critical Horne time)  Medications Ordered in UC Medications - No data to display  Initial Impression / Assessment and Plan / UC Course  I have reviewed the triage vital signs and the nursing notes.  Pertinent labs & imaging results that were available during my Horne of the patient were reviewed by me and considered in my medical decision making (see chart for details).     Patient is given a prescription for Diflucan  and Keflex .  Patient is advised to return if any problems. Final Clinical Impressions(s) / UC Diagnoses   Final diagnoses:  Urinary tract infection without hematuria, site unspecified  Yeast vaginitis     Discharge Instructions      Return if any problems.    ED Prescriptions     Medication Sig Dispense Auth. Provider   cephALEXin  (KEFLEX ) 500 MG capsule Take 1 capsule (500 mg total) by mouth 4 (four) times daily. 28 capsule Katiya Fike K, PA-C   fluconazole  (DIFLUCAN ) 150 MG tablet One tablet today then again on day 3 2 tablet Chalee Hirota K, PA-C      PDMP not reviewed this encounter. An After Visit Summary was printed and given to the patient.       Flint Sonny POUR, PA-C 05/22/24 1510

## 2024-06-11 ENCOUNTER — Ambulatory Visit
Admission: EM | Admit: 2024-06-11 | Discharge: 2024-06-11 | Disposition: A | Discharge status: Home / Self Care | Attending: Family Medicine | Admitting: Family Medicine

## 2024-06-11 DIAGNOSIS — R109 Unspecified abdominal pain: Secondary | ICD-10-CM

## 2024-06-11 DIAGNOSIS — M545 Low back pain, unspecified: Secondary | ICD-10-CM

## 2024-06-11 LAB — POCT URINALYSIS DIP (MANUAL ENTRY)
Bilirubin, UA: NEGATIVE
Blood, UA: NEGATIVE
Glucose, UA: NEGATIVE mg/dL
Ketones, POC UA: NEGATIVE mg/dL
Leukocytes, UA: NEGATIVE
Nitrite, UA: NEGATIVE
Protein Ur, POC: NEGATIVE mg/dL
Spec Grav, UA: >=1.03 — AB (ref 1.010–1.025)
Urobilinogen, UA: 0.2 U/dL
pH, UA: 6 (ref 5.0–8.0)

## 2024-06-11 MED ORDER — IBUPROFEN 800 MG PO TABS: 800.0000 mg | ORAL_TABLET | Freq: Three times a day (TID) | ORAL | 0 refills | Status: AC | PRN

## 2024-06-11 NOTE — ED Triage Notes (Signed)
 I came in here for a UTI recently, I have been taking my meds but I am having back pain now, also I would like STI test as well, to discuss with provider.

## 2024-06-11 NOTE — Discharge Instructions (Addendum)
 The urinalysis does not show any sign of urinary tract infection at this time.  It was concentrated, possibly meaning that you need to drink more fluids, mainly water .  Urine culture is sent, and staff will notify you that it looks like you need an antibiotic for a urinary tract infection.  Staff will notify you if there is anything positive on the swab (or on the blood work if that has been taken at this visit). It can take 2-3 days for the tests to result, depending on the day of the week your test was taken. You will only be notified if there are any positives on the testing; test results will also go to your MyChart if you are signed up for MyChart.    Please go to the emergency room if this is worsening or not improving.

## 2024-06-11 NOTE — ED Provider Notes (Signed)
 [name]    CSN: [id number] Arrival date & time: 06/11/24  0827      History   Chief Complaint Chief Complaint  Patient presents with   Back Pain   SEXUALLY TRANSMITTED DISEASE    HPI Theresa Horne is a 33 y.o. female.    Back Pain  Here for low back pain.  It began yesterday and is in her bilateral lower lumbar region.  No fall or trauma.  No itching or rash or tingling there  No exacerbating factors.  Movement does not affect it.  It feels like it is deep.  No dysuria and no vaginal discharge at this time.  She is allergic to iron   Last menstrual cycle was July 4.  On June 25 she was here and had had some suprapubic pain that was relieved by urinating.  She not had any dysuria but she had already started some Keflex  before being seen.  At that time urinalysis showed a small amount of leukocytes.  Culture was not done since she was already on antibiotics.  She was prescribed more Keflex  and finish that prescription.  She was also treated for yeast Past Medical History:  Diagnosis Date   Hypertension    Preterm labor    STD (sexually transmitted disease)    Chlamydia    Patient Active Problem List   Diagnosis Date Noted   [redacted] weeks gestation of pregnancy 05/22/2024   [redacted] weeks gestation of pregnancy 05/22/2024   [redacted] weeks gestation of pregnancy 05/22/2024   Chronic peripheral venous hypertension 05/22/2024   Elevated blood-pressure reading without diagnosis of hypertension 05/22/2024   Vitamin D deficiency 05/22/2024   Pre-eclampsia added to pre-existing hypertension 11/25/2023   Anemia 11/25/2023   Elevated liver enzymes 11/25/2023   Increased body mass index 11/25/2023   SVD (spontaneous vaginal delivery) 10/22/2023   Encounter for induction of labor 10/22/2023   Arrhythmia 10/22/2023   Chronic hypertension with superimposed pre-eclampsia 10/22/2023   IDA (iron  deficiency anemia) 10/22/2023   Unspecified maternal hypertension,  unspecified trimester 09/27/2023   Small for gestational age fetus affecting management of mother 07/21/2023   Primary thrombocytopenia (HCC) 07/07/2023   Anemia of pregnancy 07/07/2023   Obesity affecting pregnancy 06/13/2023   Request for sterilization 06/08/2023   Missed abortion 06/08/2021   Pregnancy 06/05/2021   Carrier of group B Streptococcus 04/11/2019   History of hypertension 10/12/2018   Not immune to rubella 10/08/2018   Obesity affecting pregnancy, antepartum 10/03/2018   History of preterm delivery 09/27/2018   History of abnormal cervical Papanicolaou smear 09/27/2018   Bacterial vaginosis 03/12/2015    Past Surgical History:  Procedure Laterality Date   INDUCED ABORTION     LAPAROSCOPIC TUBAL LIGATION     TUBAL LIGATION N/A 10/17/2023   Procedure: POST PARTUM TUBAL LIGATION;  Surgeon: Gloriann Chick, MD;  Location: [facility];  Service: Gynecology;  Laterality: N/A;    OB History     Gravida  6   Para  4   Term  3   Preterm  1   AB  2   Living  4      SAB  1   IAB  1   Ectopic      Multiple  0   Live Births  4            Home Medications    Prior to Admission medications   Medication Sig Start Date End Date Taking? Authorizing Provider  ibuprofen  (ADVIL )  800 MG tablet Take 1 tablet (800 mg total) by mouth every 8 (eight) hours as needed (pain). 06/11/24  Yes Vonna Sharlet POUR, MD  UNABLE TO FIND [employer]'s All Purpose Nipple Ointment w/o Ibuprofen  11/23/23  Yes [provider]  amLODipine  (NORVASC ) 10 MG tablet Take 1 tablet (10 mg total) by mouth daily. 10/22/23   [place] , Jade, FNP  calcium carbonate (TUMS EX) 750 MG chewable tablet Chew 1 tablet by mouth daily.    [provider]  hydrocortisone  2.5 % cream Apply 1 Application topically 3 (three) times daily.    [provider]  UNABLE TO FIND micon2%/mupi/beta val    [provider]  metoprolol  succinate (TOPROL -XL) 25 MG 24 hr tablet Take 1 tablet  (25 mg total) by mouth daily. 01/04/21 02/06/21  Loni Soyla LABOR, MD    Family History Family History  Problem Relation Age of Onset   Hypertension Mother    Diabetes Father    Hypertension Father    Diabetes Paternal Uncle    Renal Disease Maternal Grandmother    Diabetes Paternal Grandfather     Social History Social History   Tobacco Use   Smoking status: Former    Current packs/day: 0.00    Types: Cigarettes    Quit date: 01/26/2015    Years since quitting: 9.3    Passive exposure: Never   Smokeless tobacco: Never  Vaping Use   Vaping status: Never Used  Substance Use Topics   Alcohol use: Not Currently   Drug use: Not Currently    Types: Marijuana     Allergies   Iron  sucrose   Review of Systems Review of Systems  Musculoskeletal:  Positive for back pain.     Physical Exam Triage Vital Signs ED Triage Vitals  Encounter Vitals Group     BP 06/11/24 0845 128/86     Girls Systolic BP Percentile --      Girls Diastolic BP Percentile --      Boys Systolic BP Percentile --      Boys Diastolic BP Percentile --      Pulse Rate 06/11/24 0845 85     Resp 06/11/24 0845 18     Temp 06/11/24 0845 98.3 F (36.8 C)     Temp Source 06/11/24 0845 Oral     SpO2 06/11/24 0845 98 %     Weight 06/11/24 0841 235 lb (106.6 kg)     Height 06/11/24 0841 5' 4 (1.626 m)     Head Circumference --      Peak Flow --      Pain Score 06/11/24 0839 5     Pain Loc --      Pain Education --      Exclude from Growth Chart --    No data found.  Updated Vital Signs BP 128/86 (BP Location: Right Arm)   Pulse 85   Temp 98.3 F (36.8 C) (Oral)   Resp 18   Ht 5' 4 (1.626 m)   Wt 106.6 kg   LMP 05/31/2024 (Approximate)   SpO2 98%   BMI 40.34 kg/m   Visual Acuity Right Eye Distance:   Left Eye Distance:   Bilateral Distance:    Right Eye Near:   Left Eye Near:    Bilateral Near:     Physical Exam Vitals reviewed.  Constitutional:      General: She is not in  acute distress.    Appearance: She is not ill-appearing, toxic-appearing or diaphoretic.  HENT:     Mouth/Throat:     Mouth: Mucous membranes are moist.  Eyes:     Extraocular Movements: Extraocular movements intact.     Conjunctiva/sclera: Conjunctivae normal.     Pupils: Pupils are equal, round, and reactive to light.  Cardiovascular:     Rate and Rhythm: Normal rate and regular rhythm.     Heart sounds: No murmur heard. Pulmonary:     Effort: Pulmonary effort is normal.     Breath sounds: Normal breath sounds.  Abdominal:     General: There is no distension.     Palpations: Abdomen is soft.     Tenderness: There is no abdominal tenderness. There is no right CVA tenderness, left CVA tenderness or guarding.  Musculoskeletal:     Cervical back: Neck supple.  Lymphadenopathy:     Cervical: No cervical adenopathy.  Skin:    Coloration: Skin is not pale.  Neurological:     General: No focal deficit present.     Mental Status: She is alert and oriented to person, place, and time.  Psychiatric:        Behavior: Behavior normal.      UC Treatments / Results  Labs (all labs ordered are listed, but only abnormal results are displayed) Labs Reviewed  POCT URINALYSIS DIP (MANUAL ENTRY) - Abnormal; Notable for the following components:      Result Value   Spec Grav, UA >=1.030 (*)    All other components within normal limits  URINE CULTURE  CERVICOVAGINAL ANCILLARY ONLY    EKG   Radiology No results found.  Procedures Procedures (including critical care time)  Medications Ordered in UC Medications - No data to display  Initial Impression / Assessment and Plan / UC Course  I have reviewed the triage vital signs and the nursing notes.  Pertinent labs & imaging results that were available during my care of the patient were reviewed by me and considered in my medical decision making (see chart for details).     Urinalysis is clear.  Is concentrated with a gravity  greater than 1.03  Vaginal self swab is done, and we will notify of any positives on that and treat per protocol.  She is very concerned that this might be her kidneys.  Therefore urine culture is still sent.  We will notify her if the culture shows bacterial growth and has a significant colony count  Ibuprofen  is sent in for the pain.  I have asked her to go to the emergency room if this does not improve or if it worsens Final Clinical Impressions(s) / UC Diagnoses   Final diagnoses:  Flank pain  Acute bilateral low back pain without sciatica     Discharge Instructions      The urinalysis does not show any sign of urinary tract infection at this time.  It was concentrated, possibly meaning that you need to drink more fluids, mainly water .  Urine culture is sent, and staff will notify you that it looks like you need an antibiotic for a urinary tract infection.  Staff will notify you if there is anything positive on the swab (or on the blood work if that has been taken at this visit). It can take 2-3 days for the tests to result, depending on the day of the week your test was taken. You will only be notified if there are any positives on the testing; test results will also go to your MyChart if you are signed up  for MyChart.    Please go to the emergency room if this is worsening or not improving.     ED Prescriptions     Medication Sig Dispense Auth. Provider   ibuprofen  (ADVIL ) 800 MG tablet Take 1 tablet (800 mg total) by mouth every 8 (eight) hours as needed (pain). 21 tablet Laquitha Heslin K, MD      PDMP not reviewed this encounter.   Vonna Sharlet POUR, MD 06/11/24 [phone]

## 2024-06-12 LAB — CERVICOVAGINAL ANCILLARY ONLY
Bacterial Vaginitis (gardnerella): POSITIVE — AB
Candida Glabrata: NEGATIVE
Candida Vaginitis: NEGATIVE
Chlamydia: NEGATIVE
Comment: NEGATIVE
Comment: NEGATIVE
Comment: NEGATIVE
Comment: NEGATIVE
Comment: NEGATIVE
Comment: NORMAL
Neisseria Gonorrhea: NEGATIVE
Trichomonas: NEGATIVE

## 2024-06-12 LAB — URINE CULTURE: Culture: NO GROWTH

## 2024-06-13 ENCOUNTER — Telehealth: Admitting: Physician Assistant

## 2024-06-13 DIAGNOSIS — B9689 Other specified bacterial agents as the cause of diseases classified elsewhere: Secondary | ICD-10-CM

## 2024-06-13 DIAGNOSIS — N760 Acute vaginitis: Secondary | ICD-10-CM

## 2024-06-13 MED ORDER — METRONIDAZOLE 500 MG PO TABS: 500.0000 mg | ORAL_TABLET | Freq: Two times a day (BID) | ORAL | 0 refills | Status: AC

## 2024-06-13 NOTE — Progress Notes (Signed)

## 2024-06-13 NOTE — Progress Notes (Signed)
 I have spent 5 minutes in review of e-visit questionnaire, review and updating patient chart, medical decision making and response to patient.   Piedad Climes, PA-C

## 2024-06-14 ENCOUNTER — Ambulatory Visit (HOSPITAL_COMMUNITY): Payer: Self-pay

## 2024-06-28 DIAGNOSIS — B009 Herpesviral infection, unspecified: Secondary | ICD-10-CM | POA: Insufficient documentation

## 2024-06-28 DIAGNOSIS — R7989 Other specified abnormal findings of blood chemistry: Secondary | ICD-10-CM | POA: Insufficient documentation

## 2024-07-02 DIAGNOSIS — R8271 Bacteriuria: Secondary | ICD-10-CM | POA: Insufficient documentation

## 2024-07-02 DIAGNOSIS — Z8759 Personal history of other complications of pregnancy, childbirth and the puerperium: Secondary | ICD-10-CM | POA: Insufficient documentation

## 2024-07-03 DIAGNOSIS — R87610 Atypical squamous cells of undetermined significance on cytologic smear of cervix (ASC-US): Secondary | ICD-10-CM | POA: Insufficient documentation

## 2024-07-16 DIAGNOSIS — N8003 Adenomyosis of the uterus: Secondary | ICD-10-CM | POA: Insufficient documentation

## 2024-12-19 ENCOUNTER — Ambulatory Visit
Admission: EM | Admit: 2024-12-19 | Discharge: 2024-12-19 | Disposition: A | Discharge status: Home / Self Care | Attending: Family Medicine | Admitting: Family Medicine

## 2024-12-19 ENCOUNTER — Encounter: Payer: Self-pay | Admitting: Emergency Medicine

## 2024-12-19 DIAGNOSIS — J069 Acute upper respiratory infection, unspecified: Secondary | ICD-10-CM | POA: Diagnosis not present

## 2024-12-19 DIAGNOSIS — R0981 Nasal congestion: Secondary | ICD-10-CM | POA: Diagnosis not present

## 2024-12-19 DIAGNOSIS — H6993 Unspecified Eustachian tube disorder, bilateral: Secondary | ICD-10-CM | POA: Diagnosis not present

## 2024-12-19 LAB — POCT INFLUENZA A/B
Influenza A, POC: NEGATIVE
Influenza B, POC: NEGATIVE

## 2024-12-19 LAB — POC SOFIA SARS ANTIGEN FIA: SARS Coronavirus 2 Ag: NEGATIVE

## 2024-12-19 MED ORDER — FLUTICASONE PROPIONATE 50 MCG/ACT NA SUSP: 1.0000 | Freq: Every day | NASAL | 0 refills | Status: AC

## 2024-12-19 MED ORDER — CETIRIZINE HCL 10 MG PO TABS: 10.0000 mg | ORAL_TABLET | Freq: Every day | ORAL | 0 refills | Status: AC

## 2024-12-19 NOTE — ED Triage Notes (Signed)
 Pt reports nasal congestion, L ear fullness, and L-sided ear pain that started yesterday. Denies fevers and other associated symptoms. Pt has taken dayquil/nyquil with little relief. Children have been sick with common colds.

## 2024-12-19 NOTE — Discharge Instructions (Signed)
 He does not take for COVID and flu.  Start Flonase  and cetirizine  allergy medicine daily to help with your ear pain.  You may also use over-the-counter decongestants as needed.  Lots of rest and fluids and follow-up with your PCP in 2 to 3 days for recheck.  Please go to the ER for any worsening symptoms.  Hope you feel better soon!

## 2024-12-19 NOTE — ED Provider Notes (Signed)
 " [name]    CSN: [id number] Arrival date & time: 12/19/24  9178      History   Chief Complaint Chief Complaint  Patient presents with   Ear Fullness   Otalgia   Nasal Congestion    HPI EMALENE WELTE is a 34 y.o. female  presents for evaluation of URI symptoms for 1 days. Patient reports associated symptoms of nasal congestion, ear fullness/pain, scratchy throat, mild cough. Denies N/V/D, fevers, body aches or shortness of breath. Patient does not have a hx of asthma. Patient is not an active smoker.   Reports children recently had colds.  Pt has taken DayQuil NyQuil OTC for symptoms. Pt has no other concerns at this time.    Ear Fullness  Otalgia Associated symptoms: congestion and cough     Past Medical History:  Diagnosis Date   Hypertension    Preterm labor    STD (sexually transmitted disease)    Chlamydia    Patient Active Problem List   Diagnosis Date Noted   Adenomyosis of uterus 07/16/2024   Atypical squamous cells of undetermined significance (ASCUS) on Papanicolaou smear of cervix 07/03/2024   Asymptomatic bacteriuria 07/02/2024   History of pre-eclampsia 07/02/2024   Herpes simplex type 2 infection 06/28/2024   High serum creatinine 06/28/2024   [redacted] weeks gestation of pregnancy 05/22/2024   [redacted] weeks gestation of pregnancy 05/22/2024   [redacted] weeks gestation of pregnancy 05/22/2024   Chronic peripheral venous hypertension 05/22/2024   Elevated blood-pressure reading without diagnosis of hypertension 05/22/2024   Vitamin D deficiency 05/22/2024   Pre-eclampsia added to pre-existing hypertension 11/25/2023   Anemia 11/25/2023   Elevated liver enzymes 11/25/2023   Increased body mass index 11/25/2023   Essential hypertension 11/25/2023   SVD (spontaneous vaginal delivery) 10/22/2023   Encounter for induction of labor 10/22/2023   Arrhythmia 10/22/2023   Chronic hypertension with superimposed pre-eclampsia 10/22/2023   IDA (iron   deficiency anemia) 10/22/2023   Unspecified maternal hypertension, unspecified trimester 09/27/2023   Small for gestational age fetus affecting management of mother 07/21/2023   Primary thrombocytopenia (HCC) 07/07/2023   Anemia of pregnancy 07/07/2023   Obesity affecting pregnancy 06/13/2023   Request for sterilization 06/08/2023   Missed abortion 06/08/2021   Pregnancy 06/05/2021   Carrier of group B Streptococcus 04/11/2019   History of hypertension 10/12/2018   Not immune to rubella 10/08/2018   Obesity affecting pregnancy, antepartum 10/03/2018   History of preterm delivery 09/27/2018   History of intravenous iron  allergy 09/27/2018   Bacterial vaginosis 03/12/2015    Past Surgical History:  Procedure Laterality Date   INDUCED ABORTION     LAPAROSCOPIC TUBAL LIGATION     TUBAL LIGATION N/A 10/17/2023   Procedure: POST PARTUM TUBAL LIGATION;  Surgeon: Gloriann Chick, MD;  Location: [facility];  Service: Gynecology;  Laterality: N/A;    OB History     Gravida  6   Para  4   Term  3   Preterm  1   AB  2   Living  4      SAB  1   IAB  1   Ectopic      Multiple  0   Live Births  4            Home Medications    Prior to Admission medications  Medication Sig Start Date End Date Taking? Authorizing Provider  cetirizine  (ZYRTEC ) 10 MG tablet Take 1 tablet (10 mg total) by mouth  daily. 12/19/24  Yes Leoma Folds, Jodi R, NP  fluticasone  (FLONASE ) 50 MCG/ACT nasal spray Place 1 spray into both nostrils daily. 12/19/24  Yes Jared Cahn, Jodi R, NP  fluconazole  (DIFLUCAN ) 150 MG tablet TAKE 1 TABLET BY MOUTH EVERY 72 HOURS FOR 3 DAYS AS NEEDED Patient not taking: Reported on 12/19/2024    [provider]  hydrocortisone  2.5 % cream Apply 1 Application topically 3 (three) times daily. Patient not taking: Reported on 12/19/2024    [provider]  ibuprofen  (ADVIL ) 800 MG tablet Take 1 tablet (800 mg total) by mouth every 8 (eight) hours as needed  (pain). Patient not taking: Reported on 12/19/2024 06/11/24   Vonna Sharlet POUR, MD  metroNIDAZOLE  (FLAGYL ) 500 MG tablet Take 1 tablet (500 mg total) by mouth 2 (two) times daily. Patient not taking: Reported on 12/19/2024 06/13/24   Gladis Elsie BROCKS, PA-C  nitrofurantoin , macrocrystal-monohydrate, (MACROBID ) 100 MG capsule TAKE 1 CAPSULE BY MOUTH EVERY DAY AT BEDTIME Patient not taking: Reported on 12/19/2024    [provider]  UNABLE TO FIND micon2%/mupi/beta val Patient not taking: Reported on 12/19/2024    [provider]  metoprolol  succinate (TOPROL -XL) 25 MG 24 hr tablet Take 1 tablet (25 mg total) by mouth daily. 01/04/21 02/06/21  Loni Soyla LABOR, MD    Family History Family History  Problem Relation Age of Onset   Hypertension Mother    Diabetes Father    Hypertension Father    Diabetes Paternal Uncle    Renal Disease Maternal Grandmother    Diabetes Paternal Grandfather     Social History Social History[1]   Allergies   Iron  sucrose   Review of Systems Review of Systems  HENT:  Positive for congestion and ear pain.        Scratchy throat  Respiratory:  Positive for cough.      Physical Exam Triage Vital Signs ED Triage Vitals  Encounter Vitals Group     BP 12/19/24 0859 125/84     Girls Systolic BP Percentile --      Girls Diastolic BP Percentile --      Boys Systolic BP Percentile --      Boys Diastolic BP Percentile --      Pulse Rate 12/19/24 0859 78     Resp 12/19/24 0859 16     Temp 12/19/24 0859 98.7 F (37.1 C)     Temp Source 12/19/24 0859 Oral     SpO2 12/19/24 0859 96 %     Weight --      Height --      Head Circumference --      Peak Flow --      Pain Score 12/19/24 0900 0     Pain Loc --      Pain Education --      Exclude from Growth Chart --    No data found.  Updated Vital Signs BP 125/84 (BP Location: Left Arm)   Pulse 78   Temp 98.7 F (37.1 C) (Oral)   Resp 16   LMP 11/28/2024 (Approximate)   SpO2 96%    Breastfeeding No   Visual Acuity Right Eye Distance:   Left Eye Distance:   Bilateral Distance:    Right Eye Near:   Left Eye Near:    Bilateral Near:     Physical Exam Vitals and nursing note reviewed.  Constitutional:      General: She is not in acute distress.    Appearance: She is well-developed.  She is not ill-appearing.  HENT:     Head: Normocephalic and atraumatic.     Right Ear: Ear canal normal. A middle ear effusion is present. Tympanic membrane is not erythematous.     Left Ear: Ear canal normal. A middle ear effusion is present. Tympanic membrane is not erythematous.     Nose: Congestion present.     Mouth/Throat:     Mouth: Mucous membranes are moist.     Pharynx: Oropharynx is clear. Uvula midline. No oropharyngeal exudate or posterior oropharyngeal erythema.     Tonsils: No tonsillar exudate or tonsillar abscesses.  Eyes:     Conjunctiva/sclera: Conjunctivae normal.     Pupils: Pupils are equal, round, and reactive to light.  Cardiovascular:     Rate and Rhythm: Normal rate and regular rhythm.     Heart sounds: Normal heart sounds.  Pulmonary:     Effort: Pulmonary effort is normal.     Breath sounds: Normal breath sounds. No wheezing, rhonchi or rales.  Musculoskeletal:     Cervical back: Normal range of motion and neck supple.  Lymphadenopathy:     Cervical: No cervical adenopathy.  Skin:    General: Skin is warm and dry.  Neurological:     General: No focal deficit present.     Mental Status: She is alert and oriented to person, place, and time.  Psychiatric:        Mood and Affect: Mood normal.        Behavior: Behavior normal.      UC Treatments / Results  Labs (all labs ordered are listed, but only abnormal results are displayed) Labs Reviewed  POC SOFIA SARS ANTIGEN FIA - Normal  POCT INFLUENZA A/B - Normal    EKG   Radiology No results found.  Procedures Procedures (including critical care time)  Medications Ordered in  UC Medications - No data to display  Initial Impression / Assessment and Plan / UC Course  I have reviewed the triage vital signs and the nursing notes.  Pertinent labs & imaging results that were available during my care of the patient were reviewed by me and considered in my medical decision making (see chart for details).     Reviewed exam and symptoms with patient.  No red flags.  Negative COVID and flu testing.  Discussed viral illness and eustachian tube dysfunction.  Trial of Flonase  and cetirizine .  Also advised OTC decongestants as needed.  Encourage rest fluids and PCP follow-up 2 to 3 days for recheck.  ER precautions reviewed. Final Clinical Impressions(s) / UC Diagnoses   Final diagnoses:  Nasal congestion  Eustachian tube dysfunction, bilateral  Viral upper respiratory illness     Discharge Instructions      He does not take for COVID and flu.  Start Flonase  and cetirizine  allergy medicine daily to help with your ear pain.  You may also use over-the-counter decongestants as needed.  Lots of rest and fluids and follow-up with your PCP in 2 to 3 days for recheck.  Please go to the ER for any worsening symptoms.  Hope you feel better soon!     ED Prescriptions     Medication Sig Dispense Auth. Provider   fluticasone  (FLONASE ) 50 MCG/ACT nasal spray Place 1 spray into both nostrils daily. 15.8 mL Tyller Bowlby, Jodi R, NP   cetirizine  (ZYRTEC ) 10 MG tablet Take 1 tablet (10 mg total) by mouth daily. 30 tablet Akita Maxim, Jodi R, NP      PDMP  not reviewed this encounter.     [1]  Social History Tobacco Use   Smoking status: Former    Current packs/day: 0.00    Types: Cigarettes    Quit date: 01/26/2015    Years since quitting: 9.9    Passive exposure: Never   Smokeless tobacco: Never  Vaping Use   Vaping status: Never Used  Substance Use Topics   Alcohol use: Not Currently   Drug use: Not Currently    Types: Marijuana     Loreda Myla SAUNDERS, NP 12/19/24 1006  "

## 2025-01-31 ENCOUNTER — Ambulatory Visit: Admitting: Family Medicine
